# Patient Record
Sex: Female | Born: 1950
Health system: Southern US, Community
[De-identification: ages and names within clinical notes are randomized; demographics above are authoritative.]

## PROBLEM LIST (undated history)

## (undated) DIAGNOSIS — T7840XA Allergy, unspecified, initial encounter: Secondary | ICD-10-CM

## (undated) DIAGNOSIS — N6019 Diffuse cystic mastopathy of unspecified breast: Secondary | ICD-10-CM

## (undated) DIAGNOSIS — E785 Hyperlipidemia, unspecified: Secondary | ICD-10-CM

## (undated) DIAGNOSIS — M810 Age-related osteoporosis without current pathological fracture: Secondary | ICD-10-CM

## (undated) DIAGNOSIS — Z780 Asymptomatic menopausal state: Secondary | ICD-10-CM

## (undated) DIAGNOSIS — M1990 Unspecified osteoarthritis, unspecified site: Secondary | ICD-10-CM

## (undated) DIAGNOSIS — G5139 Clonic hemifacial spasm, unspecified: Secondary | ICD-10-CM

## (undated) HISTORY — DX: Clonic hemifacial spasm, unspecified: G51.39

## (undated) HISTORY — DX: Allergy, unspecified, initial encounter: T78.40XA

## (undated) HISTORY — DX: Unspecified osteoarthritis, unspecified site: M19.90

## (undated) HISTORY — DX: Asymptomatic menopausal state: Z78.0

## (undated) HISTORY — DX: Hyperlipidemia, unspecified: E78.5

## (undated) HISTORY — DX: Diffuse cystic mastopathy of unspecified breast: N60.19

## (undated) HISTORY — DX: Age-related osteoporosis without current pathological fracture: M81.0

## (undated) HISTORY — PX: BREAST LUMPECTOMY: SHX2

---

## 2001-01-10 ENCOUNTER — Ambulatory Visit (HOSPITAL_COMMUNITY): Admission: RE | Admit: 2001-01-10 | Discharge: 2001-01-10 | Payer: Self-pay | Admitting: General Surgery

## 2001-01-10 ENCOUNTER — Encounter (HOSPITAL_BASED_OUTPATIENT_CLINIC_OR_DEPARTMENT_OTHER): Payer: Self-pay | Admitting: General Surgery

## 2002-01-23 ENCOUNTER — Encounter (HOSPITAL_BASED_OUTPATIENT_CLINIC_OR_DEPARTMENT_OTHER): Payer: Self-pay | Admitting: General Surgery

## 2002-01-23 ENCOUNTER — Ambulatory Visit (HOSPITAL_COMMUNITY): Admission: RE | Admit: 2002-01-23 | Discharge: 2002-01-23 | Payer: Self-pay | Admitting: General Surgery

## 2002-12-18 ENCOUNTER — Other Ambulatory Visit: Admission: RE | Admit: 2002-12-18 | Discharge: 2002-12-18 | Payer: Self-pay | Admitting: Obstetrics and Gynecology

## 2003-02-12 ENCOUNTER — Ambulatory Visit (HOSPITAL_COMMUNITY): Admission: RE | Admit: 2003-02-12 | Discharge: 2003-02-12 | Payer: Self-pay | Admitting: General Surgery

## 2003-05-01 ENCOUNTER — Ambulatory Visit (HOSPITAL_COMMUNITY): Admission: RE | Admit: 2003-05-01 | Discharge: 2003-05-01 | Payer: Self-pay | Admitting: Gastroenterology

## 2004-04-07 ENCOUNTER — Ambulatory Visit (HOSPITAL_COMMUNITY): Admission: RE | Admit: 2004-04-07 | Discharge: 2004-04-07 | Payer: Self-pay | Admitting: General Surgery

## 2004-05-05 ENCOUNTER — Other Ambulatory Visit: Admission: RE | Admit: 2004-05-05 | Discharge: 2004-05-05 | Payer: Self-pay | Admitting: Obstetrics and Gynecology

## 2005-04-11 ENCOUNTER — Ambulatory Visit (HOSPITAL_COMMUNITY): Admission: RE | Admit: 2005-04-11 | Discharge: 2005-04-11 | Payer: Self-pay | Admitting: General Surgery

## 2005-09-28 ENCOUNTER — Other Ambulatory Visit: Admission: RE | Admit: 2005-09-28 | Discharge: 2005-09-28 | Payer: Self-pay | Admitting: Obstetrics and Gynecology

## 2006-05-10 ENCOUNTER — Ambulatory Visit (HOSPITAL_COMMUNITY): Admission: RE | Admit: 2006-05-10 | Discharge: 2006-05-10 | Payer: Self-pay | Admitting: Obstetrics and Gynecology

## 2007-05-17 ENCOUNTER — Ambulatory Visit (HOSPITAL_COMMUNITY): Admission: RE | Admit: 2007-05-17 | Discharge: 2007-05-17 | Payer: Self-pay | Admitting: Obstetrics and Gynecology

## 2008-07-02 ENCOUNTER — Ambulatory Visit (HOSPITAL_COMMUNITY): Admission: RE | Admit: 2008-07-02 | Discharge: 2008-07-02 | Payer: Self-pay | Admitting: Obstetrics and Gynecology

## 2009-07-09 ENCOUNTER — Ambulatory Visit (HOSPITAL_COMMUNITY): Admission: RE | Admit: 2009-07-09 | Discharge: 2009-07-09 | Payer: Self-pay | Admitting: Obstetrics and Gynecology

## 2009-08-10 ENCOUNTER — Encounter (HOSPITAL_COMMUNITY)
Admission: RE | Admit: 2009-08-10 | Discharge: 2009-09-09 | Payer: Self-pay | Admitting: Physical Medicine and Rehabilitation

## 2010-04-29 ENCOUNTER — Ambulatory Visit (HOSPITAL_COMMUNITY)
Admission: RE | Admit: 2010-04-29 | Discharge: 2010-04-29 | Payer: Self-pay | Source: Home / Self Care | Attending: Obstetrics & Gynecology | Admitting: Obstetrics & Gynecology

## 2010-07-28 ENCOUNTER — Other Ambulatory Visit (HOSPITAL_COMMUNITY): Payer: Self-pay | Admitting: Obstetrics and Gynecology

## 2010-07-28 DIAGNOSIS — Z139 Encounter for screening, unspecified: Secondary | ICD-10-CM

## 2010-08-02 ENCOUNTER — Ambulatory Visit (HOSPITAL_COMMUNITY)
Admission: RE | Admit: 2010-08-02 | Discharge: 2010-08-02 | Disposition: A | Discharge status: Home / Self Care | Payer: 59 | Source: Ambulatory Visit | Attending: Obstetrics and Gynecology | Admitting: Obstetrics and Gynecology

## 2010-08-02 DIAGNOSIS — Z1231 Encounter for screening mammogram for malignant neoplasm of breast: Secondary | ICD-10-CM | POA: Insufficient documentation

## 2010-08-02 DIAGNOSIS — Z139 Encounter for screening, unspecified: Secondary | ICD-10-CM

## 2010-08-26 NOTE — Op Note (Signed)
NAME:  Jaime Rodriguez, SPIKE A                           ACCOUNT NO.:  [id number]   MEDICAL RECORD NO.:  [id number]                   PATIENT TYPE:  AMB   LOCATION:  ENDO                                 FACILITY:  [facility]   PHYSICIAN:  Anselmo Rod, M.D.               DATE OF BIRTH:  1950-12-22   DATE OF PROCEDURE:  05/01/2003  DATE OF DISCHARGE:                                 OPERATIVE REPORT   PROCEDURE PERFORMED:  Screening colonoscopy.   ENDOSCOPIST:  Anselmo Rod, M.D.   INSTRUMENT USED:  Olympus videocolonoscope.   INDICATIONS FOR PROCEDURE:  60 year old African-American female underwent  screening colonoscopy.  Rule out colonic polyps, masses, etc.   PREPROCEDURE PREPARATION:  Informed consent was procured from the patient.  The patient was fasted for eight hours prior to the procedure and prepped  with a bottle of magnesium citrate and a gallon of GoLYTELY the night prior  to the procedure.  She also received Cipro 400 mg IV for MVP prophylaxis  prior to the procedure.   PREPROCEDURE PHYSICAL:  VITAL SIGNS:  The patient had stable vital signs.  NECK:  Supple.  CHEST:  Clear to auscultation.  S1 and S2 regular.  ABDOMEN:  Soft with normal bowel sounds.   DESCRIPTION OF THE PROCEDURE:  The patient was placed in the left lateral  decubitus position, sedated with 70 mg of Demerol and 7 mg of Versed  intravenously.  Once the patient was adequately sedated and maintained on  low-flow oxygen and continuous cardiac monitoring, the Olympus video  colonoscope was advanced from the rectum to the cecum. The appendicular  orifice and the ileocecal valve were clearly visualized and photographed.  No masses, polyps, erosions, ulcerations, or diverticula were seen.  Retroflexion in the rectum revealed no abnormalities.   IMPRESSION:  Normal colonoscopy up to the cecum.   RECOMMENDATIONS:  1. Repeat CRC screening is recommended in the next ten years unless the     patient develops any  abnormal     symptoms in the interim.  2. Continue on a high-fiber diet with liberal fluid intake.  3. Outpatient followup as the need arises in the future.                                               Anselmo Rod, M.D.    JNM/MEDQ  D:  05/01/2003  T:  05/01/2003  Job:  [id number]   cc:   Janine Limbo, M.D.  [address]  [place]  [place] [address]  Fax: [phone]

## 2011-01-18 ENCOUNTER — Ambulatory Visit
Admission: RE | Admit: 2011-01-18 | Discharge: 2011-01-18 | Disposition: A | Discharge status: Home / Self Care | Payer: 59 | Source: Ambulatory Visit | Attending: Gastroenterology | Admitting: Gastroenterology

## 2011-01-18 ENCOUNTER — Other Ambulatory Visit: Payer: Self-pay | Admitting: Gastroenterology

## 2011-01-18 ENCOUNTER — Ambulatory Visit (HOSPITAL_COMMUNITY)
Admission: RE | Admit: 2011-01-18 | Discharge: 2011-01-18 | Disposition: A | Discharge status: Home / Self Care | Payer: 59 | Source: Ambulatory Visit | Attending: Gastroenterology | Admitting: Gastroenterology

## 2011-01-18 DIAGNOSIS — Z1211 Encounter for screening for malignant neoplasm of colon: Secondary | ICD-10-CM

## 2011-01-18 DIAGNOSIS — K5900 Constipation, unspecified: Secondary | ICD-10-CM | POA: Insufficient documentation

## 2011-01-18 DIAGNOSIS — Z79899 Other long term (current) drug therapy: Secondary | ICD-10-CM | POA: Insufficient documentation

## 2011-01-18 DIAGNOSIS — E785 Hyperlipidemia, unspecified: Secondary | ICD-10-CM | POA: Insufficient documentation

## 2011-01-18 DIAGNOSIS — Q438 Other specified congenital malformations of intestine: Secondary | ICD-10-CM | POA: Insufficient documentation

## 2011-02-21 ENCOUNTER — Other Ambulatory Visit: Payer: Self-pay | Admitting: Neurology

## 2011-02-21 DIAGNOSIS — R519 Headache, unspecified: Secondary | ICD-10-CM

## 2011-03-01 ENCOUNTER — Other Ambulatory Visit: Payer: 59

## 2011-03-08 ENCOUNTER — Ambulatory Visit
Admission: RE | Admit: 2011-03-08 | Discharge: 2011-03-08 | Disposition: A | Discharge status: Home / Self Care | Payer: 59 | Source: Ambulatory Visit | Attending: Neurology | Admitting: Neurology

## 2011-03-08 DIAGNOSIS — R519 Headache, unspecified: Secondary | ICD-10-CM

## 2011-03-09 ENCOUNTER — Encounter (HOSPITAL_COMMUNITY): Payer: Self-pay | Admitting: Dietician

## 2011-03-09 NOTE — Progress Notes (Signed)
[facility] Diabetes Class Completion  Date:March 09, 2011  Time: 6:30 PM  Pt attended [facility]'s Diabetes Class on March 09, 2011.   Patient was educated on the following topics: carbohydrate metabolism in relation to diabetes, sources of carbohydrate, carbohydrate counting, meal planning strategies, food label reading, and portion control.   Melody Haver, RD, LDN Date:March 09, 2011 Time: 6:30 PM

## 2011-08-14 ENCOUNTER — Other Ambulatory Visit: Payer: Self-pay | Admitting: Obstetrics and Gynecology

## 2011-08-14 DIAGNOSIS — Z139 Encounter for screening, unspecified: Secondary | ICD-10-CM

## 2011-08-17 ENCOUNTER — Ambulatory Visit (HOSPITAL_COMMUNITY)
Admission: RE | Admit: 2011-08-17 | Discharge: 2011-08-17 | Disposition: A | Discharge status: Home / Self Care | Payer: 59 | Source: Ambulatory Visit | Attending: Obstetrics and Gynecology | Admitting: Obstetrics and Gynecology

## 2011-08-17 DIAGNOSIS — Z1231 Encounter for screening mammogram for malignant neoplasm of breast: Secondary | ICD-10-CM | POA: Insufficient documentation

## 2011-08-17 DIAGNOSIS — Z139 Encounter for screening, unspecified: Secondary | ICD-10-CM

## 2011-09-12 ENCOUNTER — Encounter: Payer: Self-pay | Admitting: Obstetrics and Gynecology

## 2011-09-14 ENCOUNTER — Telehealth: Payer: Self-pay | Admitting: Obstetrics and Gynecology

## 2011-09-14 NOTE — Telephone Encounter (Signed)
Triage/cht received/pt has no future appt/last seen in 2012

## 2011-09-14 NOTE — Telephone Encounter (Signed)
TC to pt.  Informed per EP that will contact her to review results in detail when has the opportunity.   Pt has received letter regarding referral as well.  Best number [phone]

## 2011-09-18 ENCOUNTER — Telehealth: Payer: Self-pay | Admitting: Obstetrics and Gynecology

## 2011-09-18 NOTE — Telephone Encounter (Signed)
Advised pt that mammogram showed dense breast tissue but no malignancy/cancer present on results. Pt voiced understanding. Annual appointment scheduled for 10/23/11.

## 2011-10-11 ENCOUNTER — Other Ambulatory Visit (HOSPITAL_COMMUNITY): Payer: Self-pay | Admitting: Internal Medicine

## 2011-10-11 DIAGNOSIS — M810 Age-related osteoporosis without current pathological fracture: Secondary | ICD-10-CM

## 2011-10-16 ENCOUNTER — Other Ambulatory Visit (HOSPITAL_COMMUNITY): Payer: 59

## 2011-10-20 ENCOUNTER — Other Ambulatory Visit (HOSPITAL_COMMUNITY): Payer: 59

## 2011-10-23 ENCOUNTER — Encounter: Payer: Self-pay | Admitting: Obstetrics and Gynecology

## 2011-10-23 ENCOUNTER — Ambulatory Visit (INDEPENDENT_AMBULATORY_CARE_PROVIDER_SITE_OTHER): Payer: 59 | Admitting: Obstetrics and Gynecology

## 2011-10-23 VITALS — BP 110/62 | Ht 60.0 in | Wt 100.0 lb

## 2011-10-23 DIAGNOSIS — N951 Menopausal and female climacteric states: Secondary | ICD-10-CM | POA: Insufficient documentation

## 2011-10-23 DIAGNOSIS — Z01419 Encounter for gynecological examination (general) (routine) without abnormal findings: Secondary | ICD-10-CM

## 2011-10-23 DIAGNOSIS — Z124 Encounter for screening for malignant neoplasm of cervix: Secondary | ICD-10-CM

## 2011-10-23 NOTE — Progress Notes (Signed)
The patient is taking hormone replacement therapy The patient  is taking a Calcium supplement. Post-menopausal bleeding:no  Last Pap: was normal July  2010 Last mammogram: was normal June  2013 Last DEXA scan : T= pt has one scheduled . Last colonoscopy:normal 2012  Urinary symptoms: none Normal bowel movements: Yes Reports abuse at home: No:   Subjective:    Jaime Rodriguez is a 61 y.o. female No obstetric history on file. who presents for annual exam. The patient has no complaints today. She has an elevated hemoglobin A1c.  She is currently taking hormone replacement therapy using the CombiPatch.  The following portions of the patient's history were reviewed and updated as appropriate: allergies, current medications, past family history, past medical history, past social history, past surgical history and problem list. See above.  Review of Systems Pertinent items are noted in HPI. Gastrointestinal:No change in bowel habits, no abdominal pain, no rectal bleeding Genitourinary:negative for dysuria, frequency, hematuria, nocturia and urinary incontinence    Objective:     BP 110/62  Ht 5' (1.524 m)  Wt 100 lb (45.36 kg)  BMI 19.53 kg/m2  Weight:  Wt Readings from Last 1 Encounters:  10/23/11 100 lb (45.36 kg)     BMI: Body mass index is 19.53 kg/(m^2). General Appearance: Alert, appropriate appearance for age. No acute distress HEENT: Grossly normal Neck / Thyroid: Supple, no masses, nodes or enlargement Lungs: clear to auscultation bilaterally Back: No CVA tenderness Breast Exam: No masses or nodes.No dimpling, nipple retraction or discharge. Cardiovascular: Regular rate and rhythm. S1, S2, no murmur Gastrointestinal: Soft, non-tender, no masses or organomegaly  ++++++++++++++++++++++++++++++++++++++++++++++++++++++++  Pelvic Exam: External genitalia: normal general appearance Vaginal: normal without tenderness, induration or masses Cervix: normal appearance Adnexa:  normal bimanual exam Uterus: normal size shape and consistency Rectovaginal: normal rectal, no masses  ++++++++++++++++++++++++++++++++++++++++++++++++++++++++  Lymphatic Exam: Non-palpable nodes in neck, clavicular, axillary, or inguinal regions  Psychiatric: Alert and oriented, appropriate affect.    Urinalysis:Not done      Assessment:    Normal gyn exam   Overweight or obese: No  Pelvic relaxation: Yes  Menopausal symptoms: yes. Severe: No.   Plan:    Pap smear.   Follow-up:  for annual exam  STD screen request: none,  RPR: No,  HBsAg: No.  Hepatitis C: No.  The updated Pap smear screening guidelines were discussed with the patient. The patient requested that I obtain a Pap smear: Yes.  Kegel exercises discussed: Yes.  Proper diet and regular exercise were reviewed.  Annual mammograms recommended starting at age 68. Proper breast care was discussed.  Screening colonoscopy is recommended beginning at age 63.  Regular health maintenance was reviewed.  Sleep hygiene was discussed.  Adequate calcium and vitamin D intake was emphasized.  The women's health initiative was reviewed.  I recommend that the patient try to discontinue the CombiPatch for now.  If her symptoms return, then we will talk about alternative treatment options.  Jaime Rodriguez.D.

## 2011-10-24 LAB — PAP IG W/ RFLX HPV ASCU

## 2011-10-27 ENCOUNTER — Other Ambulatory Visit (HOSPITAL_COMMUNITY): Payer: 59

## 2011-11-03 ENCOUNTER — Ambulatory Visit (HOSPITAL_COMMUNITY)
Admission: RE | Admit: 2011-11-03 | Discharge: 2011-11-03 | Disposition: A | Discharge status: Home / Self Care | Payer: 59 | Source: Ambulatory Visit | Attending: Internal Medicine | Admitting: Internal Medicine

## 2011-11-03 DIAGNOSIS — M810 Age-related osteoporosis without current pathological fracture: Secondary | ICD-10-CM

## 2011-11-03 DIAGNOSIS — M899 Disorder of bone, unspecified: Secondary | ICD-10-CM | POA: Insufficient documentation

## 2011-11-03 DIAGNOSIS — M949 Disorder of cartilage, unspecified: Secondary | ICD-10-CM | POA: Insufficient documentation

## 2012-03-12 ENCOUNTER — Other Ambulatory Visit: Payer: Self-pay | Admitting: Adult Health

## 2012-03-12 DIAGNOSIS — R599 Enlarged lymph nodes, unspecified: Secondary | ICD-10-CM

## 2012-03-15 ENCOUNTER — Ambulatory Visit (HOSPITAL_COMMUNITY)
Admission: RE | Admit: 2012-03-15 | Discharge: 2012-03-15 | Disposition: A | Discharge status: Home / Self Care | Payer: 59 | Source: Ambulatory Visit | Attending: Adult Health | Admitting: Adult Health

## 2012-03-15 DIAGNOSIS — R599 Enlarged lymph nodes, unspecified: Secondary | ICD-10-CM | POA: Insufficient documentation

## 2012-05-25 ENCOUNTER — Other Ambulatory Visit: Payer: Self-pay

## 2012-08-21 ENCOUNTER — Other Ambulatory Visit: Payer: Self-pay | Admitting: Obstetrics and Gynecology

## 2012-08-21 DIAGNOSIS — Z139 Encounter for screening, unspecified: Secondary | ICD-10-CM

## 2012-08-23 ENCOUNTER — Ambulatory Visit (HOSPITAL_COMMUNITY)
Admission: RE | Admit: 2012-08-23 | Discharge: 2012-08-23 | Disposition: A | Discharge status: Home / Self Care | Payer: 59 | Source: Ambulatory Visit | Attending: Obstetrics and Gynecology | Admitting: Obstetrics and Gynecology

## 2012-08-23 DIAGNOSIS — Z139 Encounter for screening, unspecified: Secondary | ICD-10-CM

## 2012-08-23 DIAGNOSIS — Z1231 Encounter for screening mammogram for malignant neoplasm of breast: Secondary | ICD-10-CM | POA: Insufficient documentation

## 2012-09-10 IMAGING — CT CT VIRTUAL COLONOSCOPY SCREENING
3 of 7 series · 15 of 46 positions shown, 17 images · non-contrast
Comparison: None

CLINICAL DATA: Screening for colon carcinoma

CT VIRTUAL COLONOSCOPY FOR SCREENING
TECHNIQUE: The patient was given a standard low so bowel
preparation with Gastrografin and barium for fluid and stool
tagging respectively.  The quality of the bowel preparation is
excellent.  Automated CO2 insufflation of the colon was performed
prior to image acquisition and colonic distention is excellent.
Image post processing was used to generate a 3D endoluminal fly-
through projection of the colon and to electronically subtract
stool/fluid as appropriate.

[Series 5: prone (id) · axial · 0.67mm/px · z∈[-400,-70]mm · 9 of 331 slices shown, 11 images (1 of 2)]
[im 34/331  soft-tissue]
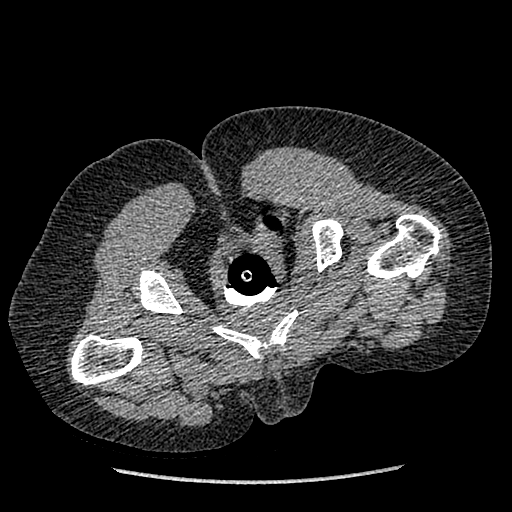
[im 34/331  bone]
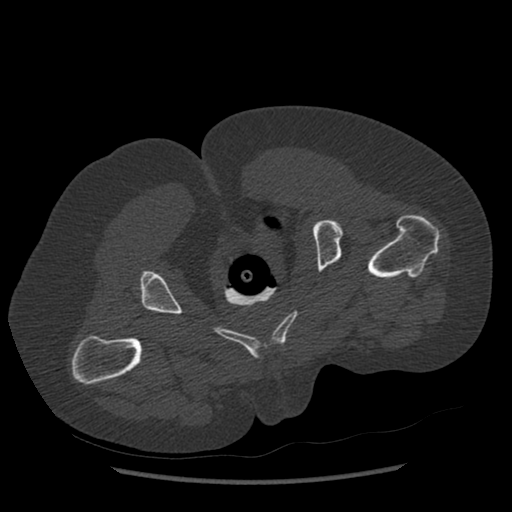
[im 67/331  soft-tissue]
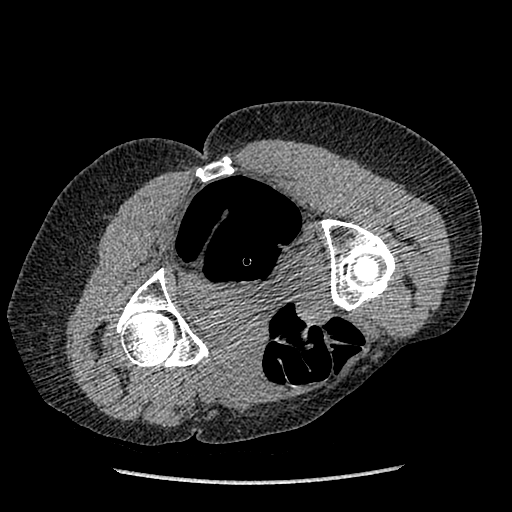
[im 100/331  soft-tissue]
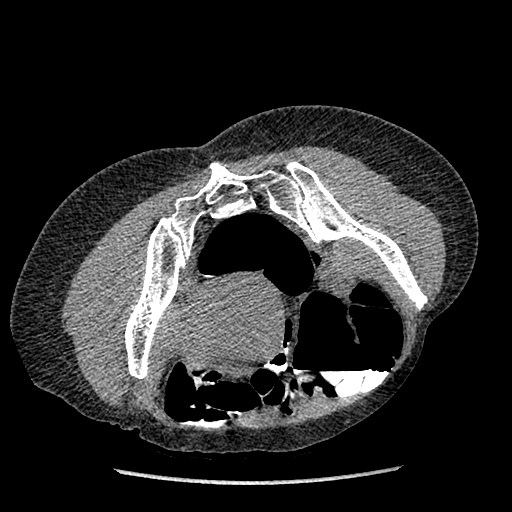
[im 133/331  soft-tissue]
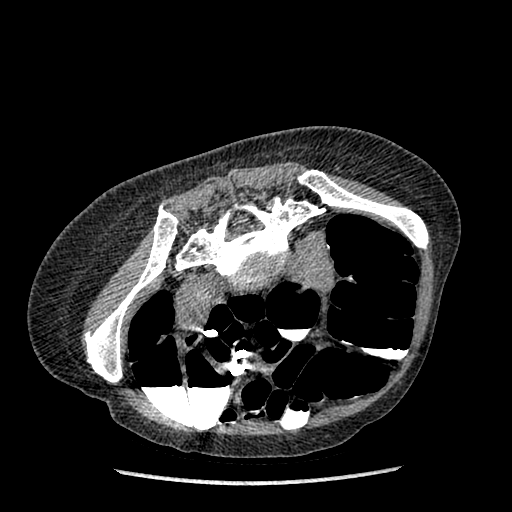
[im 166/331  soft-tissue]
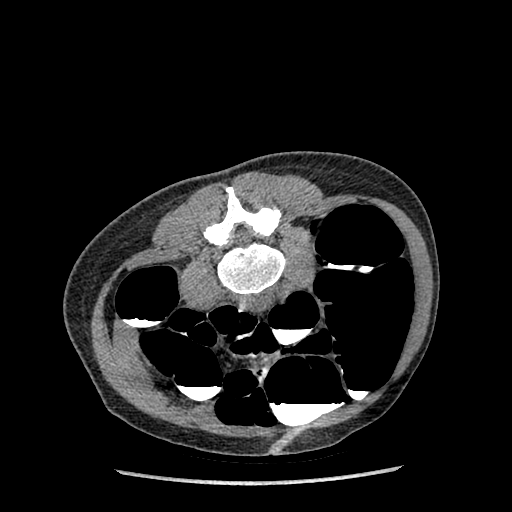
[im 199/331  soft-tissue]
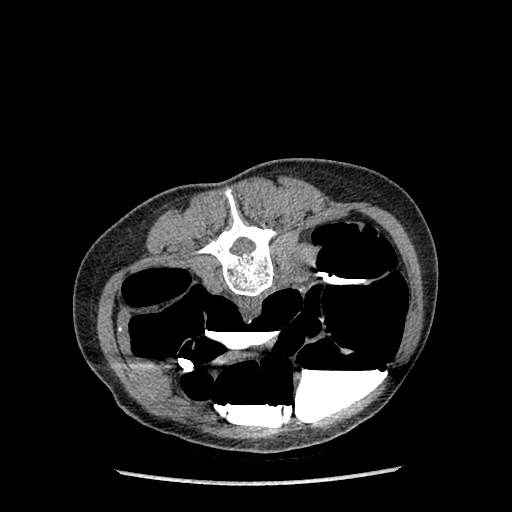
[im 232/331  soft-tissue]
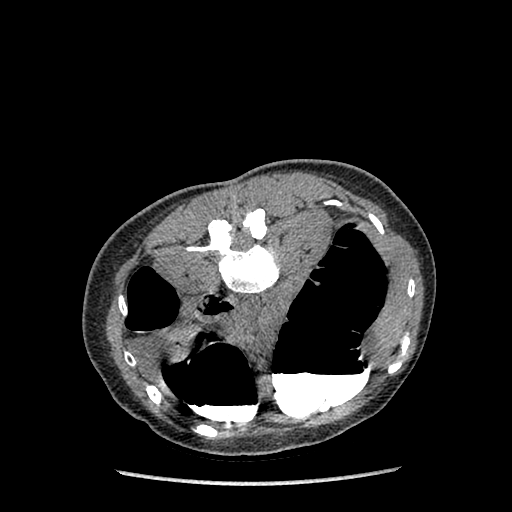
[im 265/331  soft-tissue]
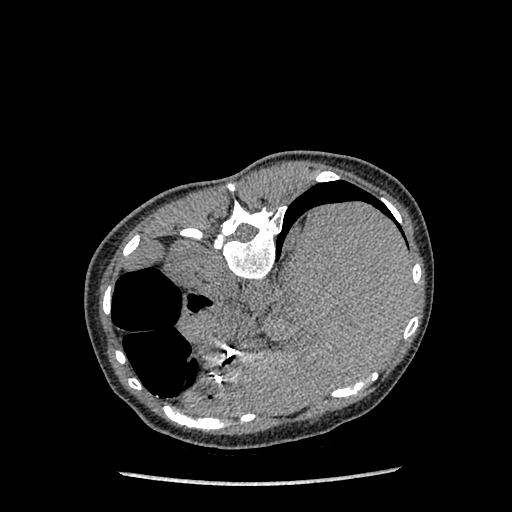
[im 298/331  soft-tissue]
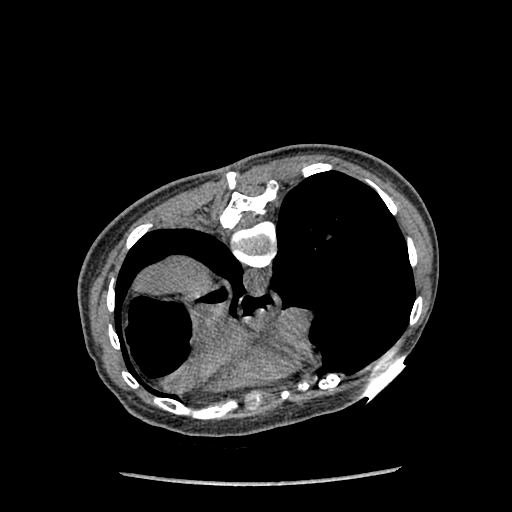
[im 298/331  bone]
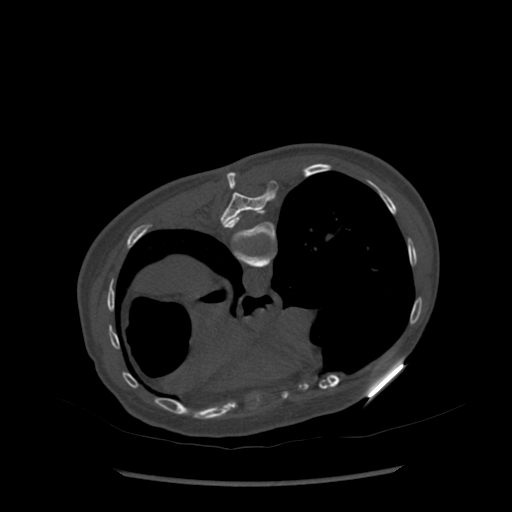

[Series 6: prone (id) · axial · 0.67mm/px · z∈[-338,-133]mm · 3 of 166 slices shown (2 of 2)]
[im 42/166  soft-tissue]
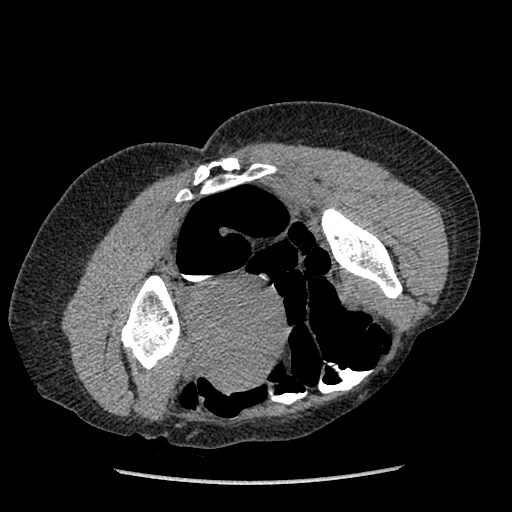
[im 83/166  soft-tissue]
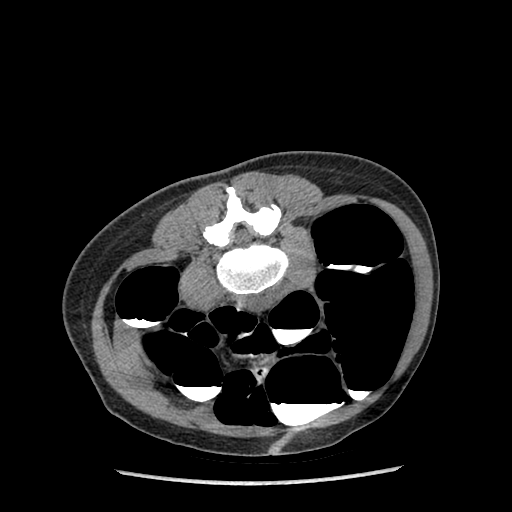
[im 124/166  soft-tissue]
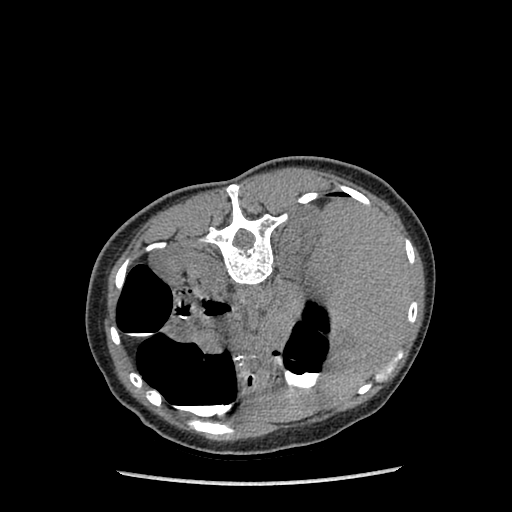

[Series 200: cor · coronal · 0.81mm/px · 3 of 125 slices shown]
[im 42/125  soft-tissue]
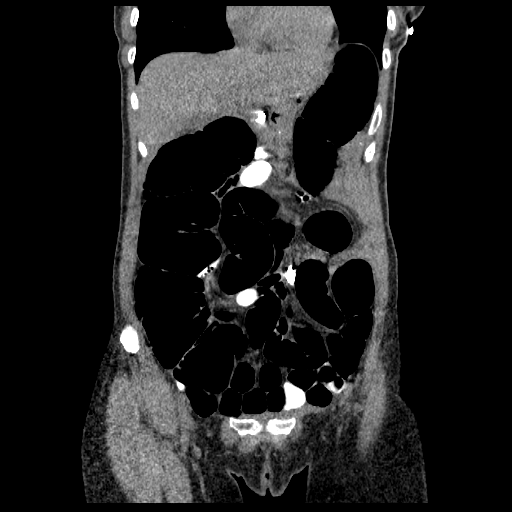
[im 56/125  soft-tissue]
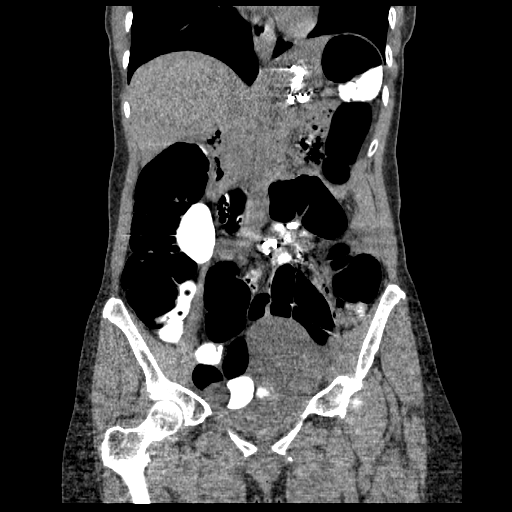
[im 69/125  soft-tissue]
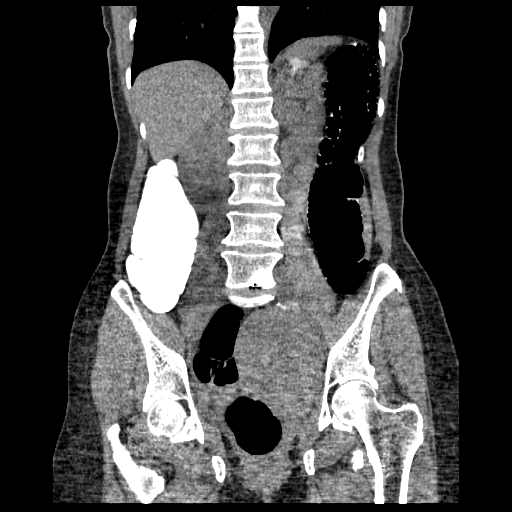

[15 of 46 positions shown; findings below may reference images not displayed]

FINDINGS: The study was performed after only oral contrast was
given prior to the virtual colonoscopy, following incomplete
optical colonoscopy this morning.  Therefore there is some retained
fluid and a small amount of feces with the fluid well tagged.  The
feces that is not tagged.  The presence of some untagged fecal
material does make the exclusion of small polypoid lesions
difficult, but no clinically significant polypoid lesion is seen.
No constricting lesion is noted.  The right colon is very well
visualized.
IMPRESSION: Some retained fluid and a small amount of feces but no clinically
significant polyp or mass is noted.

Virtual colonoscopy is not designed to detect diminutive polyps
(i.e., less than or equal to 5 mm), the presence or absence of
which may not affect clinical management.

CT ABDOMEN AND PELVIS WITHOUT CONTRAST
FINDINGS: The lung bases are clear.  There does appear to be a
small hiatal hernia present.  The liver is unremarkable in the
unenhanced state.  No calcified gallstones are seen.  The pancreas
is difficult to assess in the unenhanced state but grossly is
normal in size.  The adrenal glands also are difficult to
visualize, and the spleen is normal in size.  The stomach is
decompressed.  The kidneys are unremarkable in the unenhanced state
with no definite calculi or mass.  The abdominal aorta is normal in
caliber.

There is a rounded homogeneous mass in the left pelvis measuring
8.2 x 7.3 cm.  This is most consistent with an enlarged and
somewhat lobular uterus possibly with uterine fibroids.  No pelvic
adenopathy is seen and no fluid is noted within the pelvis.  This
probable enlarged uterus does extrinsically indent the rectosigmoid
colon from the left of midline.  The urinary bladder is
decompressed.  There is degenerative disc disease at the L5-S1
level.
IMPRESSION: 1.  Lobular soft tissue mass within the pelvis to the left of
midline most consistent with an enlarged uterus possibly due to
fibroids.  This does extrinsically indenting the rectosigmoid colon
from the left of midline.
2. Suspect small hiatal hernia.

## 2012-12-17 ENCOUNTER — Encounter: Payer: Self-pay | Admitting: Neurology

## 2012-12-18 ENCOUNTER — Ambulatory Visit (INDEPENDENT_AMBULATORY_CARE_PROVIDER_SITE_OTHER): Payer: 59 | Admitting: Neurology

## 2012-12-18 ENCOUNTER — Encounter: Payer: Self-pay | Admitting: Neurology

## 2012-12-18 VITALS — BP 130/80 | HR 90 | Ht 60.0 in | Wt 96.0 lb

## 2012-12-18 DIAGNOSIS — G5139 Clonic hemifacial spasm, unspecified: Secondary | ICD-10-CM | POA: Insufficient documentation

## 2012-12-18 DIAGNOSIS — G518 Other disorders of facial nerve: Secondary | ICD-10-CM

## 2012-12-18 HISTORY — DX: Clonic hemifacial spasm, unspecified: G51.39

## 2012-12-18 NOTE — Progress Notes (Signed)
Reason for visit: Hemifacial spasm  Jaime Rodriguez is a 62 y.o. female  History of present illness:  Jaime Rodriguez is a 62 year old right-handed black female with a history of left-sided hemifacial spasm that began approximately a year ago. The patient was seen by Dr. Vela Prose, and she underwent MRI evaluation of the brain with and without gadolinium that was unremarkable. The patient underwent Botox injections mainly because she did not wish to have medical therapy for the hemifacial spasm. The patient did well with the Botox, and the effects lasted several months. The patient did have some mild left-sided facial weakness following the injection. The patient never went back for further injections. The patient indicates that the problem has returned, and she wishes treatment for this problem at this time. The patient denies any numbness or weakness of the face, arms, or legs. The patient denies any balance issues, speech problems or swallowing problems. The patient denies any change in vision. The patient does not give a history of a Bell's palsy in the past. The patient is sent to this office for further evaluation. The patient indicates that the spasms may close down the eye on the left partially.  Past Medical History  Diagnosis Date  . Allergy   . Diabetes mellitus   . Fibrocystic disease of breast     LEFT BREAST SURGERY  . Hyperlipidemia   . Postmenopausal   . Hemifacial spasm 12/18/2012    Left lower face    Past Surgical History  Procedure Laterality Date  . Breast lumpectomy      Benign lesion    Family History  Problem Relation Age of Onset  . Cancer Mother   . Stroke Father   . Prostate cancer Brother   . Cancer Brother     Social history:  reports that she has never smoked. She has never used smokeless tobacco. She reports that she does not drink alcohol or use illicit drugs.  Medications:  Current Outpatient Prescriptions on File Prior to Visit  Medication Sig Dispense  Refill  . aspirin 81 MG chewable tablet Chew 81 mg by mouth daily.      . clindamycin (CLEOCIN) 300 MG capsule Take 300 mg by mouth daily as needed. Prior to dental work      . fexofenadine (ALLEGRA) 180 MG tablet Take 180 mg by mouth daily.       No current facility-administered medications on file prior to visit.      Allergies  Allergen Reactions  . Penicillins     ROS:  Out of a complete 14 system review of symptoms, the patient complains only of the following symptoms, and all other reviewed systems are negative.  Heart murmur Birthmarks Feeling hot, cold Allergies  Blood pressure 130/80, pulse 90, height 5' (1.524 m), weight 96 lb (43.545 kg).  Physical Exam  General: The patient is alert and cooperative at the time of the examination.  Head: Pupils are equal, round, and reactive to light. Discs are flat bilaterally.  Neck: The neck is supple, no carotid bruits are noted.  Respiratory: The respiratory examination is clear.  Cardiovascular: The cardiovascular examination reveals a regular rate and rhythm, no obvious murmurs or rubs are noted.  Skin: Extremities are without significant edema.  Neurologic Exam  Mental status:  Cranial nerves: Facial symmetry is present. There is good sensation of the face to pinprick and soft touch bilaterally. The strength of the facial muscles and the muscles to head turning and shoulder shrug  are normal bilaterally. Speech is well enunciated, no aphasia or dysarthria is noted. Extraocular movements are full. Visual fields are full. The patient has intermittent twitches around the left eye, and lower face. No spasms are seen on the forehead.  Motor: The motor testing reveals 5 over 5 strength of all 4 extremities. Good symmetric motor tone is noted throughout.  Sensory: Sensory testing is intact to pinprick, soft touch, vibration sensation, and position sense on all 4 extremities. No evidence of extinction is  noted.  Coordination: Cerebellar testing reveals good finger-nose-finger and heel-to-shin bilaterally.  Gait and station: Gait is normal. Tandem gait is normal. Romberg is negative. No drift is seen.  Reflexes: Deep tendon reflexes are symmetric and normal bilaterally. Toes are downgoing bilaterally.   Assessment/Plan:  1. Left hemifacial spasm  The hemifacial spasm primarily involves the lower face on the left. The patient did well with Botox injections in the past, and she will be sent back for this. The patient does not wish to have medical therapy such as carbamazepine, baclofen, or clonazepam for the hemifacial spasm. The patient will followup for the Botox injections once the treatment is approved through the insurance.  Marlan Palau MD 12/18/2012 7:36 PM  [facility] [address] [place], [place] [address]  Phone [phone] Fax [phone]

## 2013-01-22 ENCOUNTER — Telehealth: Payer: Self-pay | Admitting: Neurology

## 2013-01-23 MED ORDER — CARBAMAZEPINE 100 MG PO CHEW: CHEWABLE_TABLET | ORAL | Status: DC

## 2013-01-23 NOTE — Telephone Encounter (Signed)
This encounter was created in error - please disregard.

## 2013-01-23 NOTE — Telephone Encounter (Signed)
I called patient. Apparently Botox cannot be used secondary to financial constraints. The patient will be given a trial on low-dose carbamazepine. The patient will contact me if she is having side effects on this medication.  I talked with Dr. Hosie Poisson who indicated that if finances are the true reason for the lack of interest in Botox, the medication could the obtained at no cost possibly. The patient is interested in this, she is to contact our office. Otherwise, I will call in the carbamazepine.

## 2013-01-23 NOTE — Telephone Encounter (Signed)
Patient wanted to notify Dr.Willis and Dr.Yan that should rather not do Botox at this time for financial reasons. She is requesting to try an alternative treatment or medication.

## 2013-02-05 ENCOUNTER — Ambulatory Visit: Payer: 59 | Admitting: Neurology

## 2013-02-10 ENCOUNTER — Telehealth: Payer: Self-pay | Admitting: Neurology

## 2013-02-10 NOTE — Telephone Encounter (Signed)
If the patient is unable to get the pediatric dose, she will need to take one quarter tablet twice daily for 2 weeks, then one half tablet twice daily if she is having issues with with this, she will contact our office.

## 2013-02-13 ENCOUNTER — Other Ambulatory Visit: Payer: Self-pay

## 2013-05-02 ENCOUNTER — Telehealth: Payer: Self-pay | Admitting: Neurology

## 2013-05-02 MED ORDER — INCOBOTULINUMTOXINA 50 UNITS IM SOLR: 50.0000 [IU] | Freq: Once | INTRAMUSCULAR | Status: DC

## 2013-05-02 NOTE — Telephone Encounter (Signed)
Message copied by Levert FeinsteinYAN, Bellamia Ferch on Fri May 02, 2013  5:35 PM ------      Message from: Eugenie BirksOOPER, JANISHA R      Created: Fri May 02, 2013 10:24 AM       This patient has decided to try the injections. [facility] Xeomin and let me know when it has been done. ------

## 2013-05-28 ENCOUNTER — Ambulatory Visit: Payer: 59 | Admitting: Neurology

## 2013-06-04 ENCOUNTER — Ambulatory Visit: Payer: 59 | Admitting: Neurology

## 2013-06-06 ENCOUNTER — Ambulatory Visit (INDEPENDENT_AMBULATORY_CARE_PROVIDER_SITE_OTHER): Payer: 59 | Admitting: Neurology

## 2013-06-06 ENCOUNTER — Encounter: Payer: Self-pay | Admitting: Neurology

## 2013-06-06 ENCOUNTER — Ambulatory Visit: Payer: 59 | Admitting: Neurology

## 2013-06-06 VITALS — BP 131/79 | HR 80 | Ht 60.0 in | Wt 101.0 lb

## 2013-06-06 DIAGNOSIS — R292 Abnormal reflex: Secondary | ICD-10-CM | POA: Insufficient documentation

## 2013-06-06 DIAGNOSIS — R3915 Urgency of urination: Secondary | ICD-10-CM

## 2013-06-06 DIAGNOSIS — G518 Other disorders of facial nerve: Secondary | ICD-10-CM

## 2013-06-06 NOTE — Progress Notes (Signed)
Reason for visit: Hemifacial spasm  Jaime Rodriguez is a 63 y.o.  right-handed black female referred by Dr. Anne HahnWillis for EMG guided botulinumtoxin injection for left facial spasm  She had a history of left-sided hemifacial spasm that began approximately a year ago in 2013. The patient was seen by Dr. Vela ProseLewitt, and she underwent MRI evaluation of the brain with and without gadolinium that was unremarkable. The patient underwent Botox injections in 2013,   She did well with the Botox, and the effects lasted almost one year. She did has mild left facial droop after the injection.  Over the past few months, she began to experience recurrent left facial spasm again, most bothersome symptoms is around her eyes, she works as a Agricultural engineernursing assistant, is causing social embarrassment, sometimes even block her left vision.   She denies any balance issues, speech problems or swallowing problems. The patient denies any change in vision. She denies a previous history of left Bell's palsy.  In recent 1 year, since 2014, She began to have urinary urgency, she never had children in the past she denies bilateral upper and lower extremity paresthesia    Past Medical History  Diagnosis Date  . Allergy   . Diabetes mellitus   . Fibrocystic disease of breast     LEFT BREAST SURGERY  . Hyperlipidemia   . Postmenopausal   . Hemifacial spasm 12/18/2012    Left lower face    Past Surgical History  Procedure Laterality Date  . Breast lumpectomy      Benign lesion    Family History  Problem Relation Age of Onset  . Cancer Mother   . Stroke Father   . Prostate cancer Brother   . Cancer Brother     Social history:  reports that she has never smoked. She has never used smokeless tobacco. She reports that she does not drink alcohol or use illicit drugs.  Medications:  Current Outpatient Prescriptions on File Prior to Visit  Medication Sig Dispense Refill  . aspirin 81 MG chewable tablet Chew 81 mg by mouth  daily.      . carbamazepine (TEGRETOL) 100 MG chewable tablet One half tablet twice daily for 2 weeks, then take one full tablet twice daily  60 tablet  2  . clindamycin (CLEOCIN) 300 MG capsule Take 300 mg by mouth daily as needed. Prior to dental work      . ergocalciferol (VITAMIN D2) 50000 UNITS capsule Take 50,000 Units by mouth 2 (two) times a week.      . fexofenadine (ALLEGRA) 180 MG tablet Take 180 mg by mouth daily.      Marland Kitchen. incobotulinumtoxinA (XEOMIN) 50 UNITS SOLR injection Inject 50 Units into the muscle once.  50 each  3  . Pitavastatin Calcium (LIVALO) 4 MG TABS Take 4 mg by mouth daily. Mon-Fri       No current facility-administered medications on file prior to visit.      Allergies  Allergen Reactions  . Penicillins     ROS:  Out of a complete 14 system review of symptoms, the patient complains only of the following symptoms, and all other reviewed systems are negative. Urinary urgency  Blood pressure 131/79, pulse 80, height 5' (1.524 m), weight 101 lb (45.813 kg).  PHYSICAL EXAMINATOINS:  Generalized: In no acute distress  Neck: Supple, no carotid bruits   Cardiac: Regular rate rhythm  Pulmonary: Clear to auscultation bilaterally  Musculoskeletal: No deformity  Neurological examination  Mentation: Alert oriented  to time, place, history taking, and causual conversation  Cranial nerve II-XII: Pupils were equal round reactive to light extraocular movements were full, visual field were full on confrontational test.  Bilateral fundi were sharp  Facial sensation and strength were normal. hearing was intact to finger rubbing bilaterally. Uvula tongue midline.  head turning and shoulder shrug and were normal and symmetric.Tongue protrusion into cheek strength was normal.  Motor: normal tone, bulk and strength.  Sensory: Intact to fine touch, pinprick, preserved vibratory sensation, and proprioception at toes.  Coordination: Normal finger to nose, heel-to-shin  bilaterally there was no truncal ataxia  Gait: Rising up from seated position without assistance, normal stance, without trunk ataxia, moderate stride, good arm swing, smooth turning, able to perform tiptoe, and heel walking without difficulty.   Romberg signs: Negative  Deep tendon reflexes: Brachioradialis 3/3, biceps 3/3, triceps 3/3,, patellar 3/3,, Achilles 2/2, plantar responses were flexor bilaterally, she has bilateral Hoffman sign.   Assessment/Plan:   Left hemifacial spasm, under EMG guidance, incobotulinum toxin was injected at the left facial muscles, (50 units/1 cc normal saline, lot #[id number] 7  8) 37. 5 units were used, discarded 12.5 units.  Left orbicularis oculi at 2,3,4,6,8 oclock 2.5 units x5=12.5 units Right orbicularis oculi at 9:00 2 point 5 units  Left frontalis 7 point 5 units into injection sites Left zygomatic major2.5 Left risoruis 2.5  Left platysmus 10 units in 2 injections sites,   She also has hyperreflexia, bilateral upper extremity Hoffmann sign, a year history of urinary urgency, after discussed with patient, we will proceed with MRI of cervical, to rule out cervical spondylitic myelopathy  Levert Feinstein, M.D. Ph.D   06/06/2013 3:07 PM  [facility] [address] [place], [place] [address]  Phone [phone] Fax [phone]

## 2013-08-21 ENCOUNTER — Other Ambulatory Visit (HOSPITAL_COMMUNITY): Payer: Self-pay | Admitting: Internal Medicine

## 2013-08-21 DIAGNOSIS — Z1231 Encounter for screening mammogram for malignant neoplasm of breast: Secondary | ICD-10-CM

## 2013-09-03 ENCOUNTER — Ambulatory Visit: Payer: 59 | Admitting: Neurology

## 2013-09-05 ENCOUNTER — Ambulatory Visit (HOSPITAL_COMMUNITY)
Admission: RE | Admit: 2013-09-05 | Discharge: 2013-09-05 | Disposition: A | Discharge status: Home / Self Care | Payer: 59 | Source: Ambulatory Visit | Attending: Internal Medicine | Admitting: Internal Medicine

## 2013-09-05 DIAGNOSIS — Z1231 Encounter for screening mammogram for malignant neoplasm of breast: Secondary | ICD-10-CM | POA: Insufficient documentation

## 2014-04-17 ENCOUNTER — Telehealth: Payer: Self-pay | Admitting: Neurology

## 2014-04-21 NOTE — Telephone Encounter (Signed)
error 

## 2014-05-05 ENCOUNTER — Telehealth: Payer: Self-pay | Admitting: Neurology

## 2014-05-05 NOTE — Telephone Encounter (Signed)
This patient's pharmacy denied her for Xeomin due to it not being a Dx of Either blepharospasm or cervical dystonia/spasmodic torticollis. Please advise.  Jaime Rodriguez  Chart reviewed, last visit Feb 2015, if she wants to continue injection, let her come back to clinic, may change to BOTOX.

## 2014-05-06 ENCOUNTER — Ambulatory Visit: Payer: 59 | Admitting: Neurology

## 2014-05-06 NOTE — Telephone Encounter (Signed)
Do you want to set up an office visit first or have Jaime Rodriguez go ahead and try to get Botox approved?

## 2014-05-06 NOTE — Telephone Encounter (Signed)
Yes, give her an office visit.

## 2014-05-11 NOTE — Telephone Encounter (Signed)
Appt scheduled for 06/15/14 at 10:30.

## 2014-06-15 ENCOUNTER — Encounter: Payer: Self-pay | Admitting: Neurology

## 2014-06-15 ENCOUNTER — Ambulatory Visit (INDEPENDENT_AMBULATORY_CARE_PROVIDER_SITE_OTHER): Payer: 59 | Admitting: Neurology

## 2014-06-15 VITALS — BP 147/72 | HR 78 | Ht 60.0 in | Wt 105.0 lb

## 2014-06-15 DIAGNOSIS — G513 Clonic hemifacial spasm: Secondary | ICD-10-CM

## 2014-06-15 DIAGNOSIS — G5139 Clonic hemifacial spasm, unspecified: Secondary | ICD-10-CM

## 2014-06-15 NOTE — Progress Notes (Signed)
Reason for visit: Hemifacial spasm  Jaime Rodriguez is a 64 y.o.  right-handed black female referred by Dr. Anne HahnWillis for EMG guided botulinumtoxin injection for left facial spasm  She had a history of left-sided hemifacial spasm that began approximately a year ago in 2013. The patient was seen by Dr. Vela ProseLewitt, and she underwent MRI evaluation of the brain with and without gadolinium that was unremarkable. The patient underwent Botox injections in 2013,   She did well with the Botox, and the effects lasted almost one year. She did has mild left facial droop after the injection.  Over the past few months, she began to experience recurrent left facial spasm again, most bothersome symptoms is around her eyes, she works as a Agricultural engineernursing assistant, is causing social embarrassment, sometimes even block her left vision.   She denies any balance issues, speech problems or swallowing problems. The patient denies any change in vision. She denies a previous history of left Bell's palsy.  In recent 1 year, since 2014, She began to have urinary urgency, she never had children in the past she denies bilateral upper and lower extremity paresthesia   UPDATE March 7th 2016:  Last injection was in Feb 2015, very helpful, did very well, she noticed recurrence of her left facial twitching, almost constant, she hoped to continue EMG guided xeomin injection for her left facial spasm,   Past Medical History  Diagnosis Date  . Allergy   . Diabetes mellitus   . Fibrocystic disease of breast     LEFT BREAST SURGERY  . Hyperlipidemia   . Postmenopausal   . Hemifacial spasm 12/18/2012    Left lower face    Past Surgical History  Procedure Laterality Date  . Breast lumpectomy      Benign lesion    Family History  Problem Relation Age of Onset  . Cancer Mother   . Stroke Father   . Prostate cancer Brother   . Cancer Brother     Social history:  reports that she has never smoked. She has never used smokeless  tobacco. She reports that she does not drink alcohol or use illicit drugs.  Medications:  Current Outpatient Prescriptions on File Prior to Visit  Medication Sig Dispense Refill  . aspirin 81 MG chewable tablet Chew 81 mg by mouth daily.    . clindamycin (CLEOCIN) 300 MG capsule Take 300 mg by mouth daily as needed. Prior to dental work    . docusate sodium (DOCU SOFT) 100 MG capsule Take 100 mg by mouth 2 (two) times daily.    . meclizine (ANTIVERT) 25 MG tablet Take 25 mg by mouth 3 (three) times daily as needed for dizziness.     No current facility-administered medications on file prior to visit.      Allergies  Allergen Reactions  . Penicillins     ROS:  Out of a complete 14 system review of symptoms, the patient complains only of the following symptoms, and all other reviewed systems are negative. Urinary urgency  Blood pressure 147/72, pulse 78, height 5' (1.524 m), weight 105 lb (47.628 kg).  PHYSICAL EXAMNIATION:  Gen: NAD, conversant, well nourised, obese, well groomed                     Cardiovascular: Regular rate rhythm, no peripheral edema, warm, nontender. Eyes: Conjunctivae clear without exudates or hemorrhage Neck: Supple, no carotid bruise. Pulmonary: Clear to auscultation bilaterally   NEUROLOGICAL EXAM:  MENTAL STATUS: Speech:  Speech is normal; fluent and spontaneous with normal comprehension.  Cognition:    The patient is oriented to person, place, and time;     recent and remote memory intact;     language fluent;     normal attention, concentration,     fund of knowledge.  CRANIAL NERVES: CN II: Visual fields are full to confrontation. Fundoscopic exam is normal with sharp discs and no vascular changes. Venous pulsations are present bilaterally. Pupils are 4 mm and briskly reactive to light. Visual acuity is 20/20 bilaterally. CN III, IV, VI: extraocular movement are normal. No ptosis. CN V: Facial sensation is intact to pinprick in all 3  divisions bilaterally. Corneal responses are intact.  CN VII: She has frequent left hemifacial muscle spasm, mild left cheek puff weakness. Good bilateral frontalis, orbicularis oculi movement.  CN VIII: Hearing is normal to rubbing fingers CN IX, X: Palate elevates symmetrically. Phonation is normal. CN XI: Head turning and shoulder shrug are intact CN XII: Tongue is midline with normal movements and no atrophy.  MOTOR: There is no pronator drift of out-stretched arms. Muscle bulk and tone are normal. Muscle strength is normal.   Shoulder abduction Shoulder external rotation Elbow flexion Elbow extension Wrist flexion Wrist extension Finger abduction Hip flexion Knee flexion Knee extension Ankle dorsi flexion Ankle plantar flexion  R L REFLEXES: Reflexes are 2+ and symmetric at the biceps, triceps, knees, and ankles. Plantar responses are flexor.  SENSORY: Light touch, pinprick, position sense, and vibration sense are intact in fingers and toes.  COORDINATION: Rapid alternating movements and fine finger movements are intact. There is no dysmetria on finger-to-nose and heel-knee-shin. There are no abnormal or extraneous movements.   GAIT/STANCE: Posture is normal. Gait is steady with normal steps, base, arm swing, and turning. Heel and toe walking are normal. Tandem gait is normal.  Romberg is absent.   Assessment/Plan:  Left hemifacial spasm, responded very well to previous EMG guided xeomin injection, last injection was in February 2015, preauthorization process,  Return to clinic in 2-3 weeks for repeat injection  Levert Feinstein, M.D. Ph.D.  [facility] [address] [place], [place] [address] Phone: [phone] Fax:      [phone]

## 2014-07-13 ENCOUNTER — Telehealth: Payer: Self-pay | Admitting: Neurology

## 2014-07-13 ENCOUNTER — Ambulatory Visit: Payer: 59 | Admitting: Neurology

## 2014-07-13 NOTE — Telephone Encounter (Addendum)
Generate a letter for medical necessity of EMG guided Botox toxin injection for facial spasm  Letter is ready.  Levert FeinsteinYijun Deundra Furber, M.D. Ph.D.  [facility] [address] [place], [place] [address] Phone: [phone] Fax:      [phone]

## 2014-08-06 ENCOUNTER — Encounter: Payer: Self-pay | Admitting: Neurology

## 2014-08-06 ENCOUNTER — Ambulatory Visit (INDEPENDENT_AMBULATORY_CARE_PROVIDER_SITE_OTHER): Payer: 59 | Admitting: Neurology

## 2014-08-06 VITALS — BP 131/81 | HR 75 | Ht 60.0 in | Wt 105.0 lb

## 2014-08-06 DIAGNOSIS — G5139 Clonic hemifacial spasm, unspecified: Secondary | ICD-10-CM

## 2014-08-06 DIAGNOSIS — G513 Clonic hemifacial spasm: Secondary | ICD-10-CM

## 2014-08-06 NOTE — Progress Notes (Signed)
Botox - Lot #[id number] - Exp. 02/2017 //mck,rn. 

## 2014-08-06 NOTE — Progress Notes (Signed)
Reason for visit: Hemifacial spasm  Jaime Rodriguez is a 64 y.o.  right-handed black female referred by Dr. Anne HahnWillis for EMG guided botulinumtoxin injection for left facial spasm  She had a history of left-sided hemifacial spasm that began approximately a year ago in 2013. The patient was seen by Dr. Vela ProseLewitt, and she underwent MRI evaluation of the brain with and without gadolinium that was unremarkable. The patient underwent Botox injections in 2013,   She did well with the Botox, and the effects lasted almost one year. She did has mild left facial droop after the injection.  Over the past few months, she began to experience recurrent left facial spasm again, most bothersome symptoms is around her eyes, she works as a Agricultural engineernursing assistant, is causing social embarrassment, sometimes even block her left vision.   She denies any balance issues, speech problems or swallowing problems. The patient denies any change in vision. She denies a previous history of left Bell's palsy.  In recent 1 year, since 2014, She began to have urinary urgency, she never had children in the past she denies bilateral upper and lower extremity paresthesia   UPDATE March 7th 2016:  Last injection was in Feb 2015, very helpful, did very well, she noticed recurrence of her left facial twitching, almost constant, she hoped to continue EMG guided xeomin injection for her left facial spasm,   UPDATE April 28th 2016: Her insurance refused xeomin, approved for Botox A 100 unit   Past Medical History  Diagnosis Date  . Allergy   . Diabetes mellitus   . Fibrocystic disease of breast     LEFT BREAST SURGERY  . Hyperlipidemia   . Postmenopausal   . Hemifacial spasm 12/18/2012    Left lower face    Past Surgical History  Procedure Laterality Date  . Breast lumpectomy      Benign lesion    Family History  Problem Relation Age of Onset  . Cancer Mother   . Stroke Father   . Prostate cancer Brother   . Cancer Brother      Social history:  reports that she has never smoked. She has never used smokeless tobacco. She reports that she does not drink alcohol or use illicit drugs.  Medications:  Current Outpatient Prescriptions on File Prior to Visit  Medication Sig Dispense Refill  . aspirin 81 MG chewable tablet Chew 81 mg by mouth daily.    . Cholecalciferol (VITAMIN D3) 5000 UNITS CAPS Take by mouth 3 (three) times daily.    . clindamycin (CLEOCIN) 300 MG capsule Take 300 mg by mouth daily as needed. Prior to dental work    . docusate sodium (DOCU SOFT) 100 MG capsule Take 100 mg by mouth 2 (two) times daily.    Marland Kitchen. ezetimibe-simvastatin (VYTORIN) 10-20 MG per tablet Take 1 tablet by mouth daily.    Marland Kitchen. loratadine (CLARITIN) 10 MG tablet Take 10 mg by mouth daily.    . meclizine (ANTIVERT) 25 MG tablet Take 25 mg by mouth 3 (three) times daily as needed for dizziness.     No current facility-administered medications on file prior to visit.      Allergies  Allergen Reactions  . Penicillins     ROS:  Out of a complete 14 system review of symptoms, the patient complains only of the following symptoms, and all other reviewed systems are negative. Urinary urgency  Blood pressure 131/81, pulse 75, height 5' (1.524 m), weight 105 lb (47.628 kg).  PHYSICAL  EXAMNIATION:  Gen: NAD, conversant, well nourised, obese, well groomed                     Cardiovascular: Regular rate rhythm, no peripheral edema, warm, nontender. Eyes: Conjunctivae clear without exudates or hemorrhage Neck: Supple, no carotid bruise. Pulmonary: Clear to auscultation bilaterally   NEUROLOGICAL EXAM:  MENTAL STATUS: Speech:    Speech is normal; fluent and spontaneous with normal comprehension.  Cognition:    The patient is oriented to person, place, and time;     recent and remote memory intact;     language fluent;     normal attention, concentration,     fund of knowledge.  CRANIAL NERVES:  CN II: Visual fields are full  to confrontation. Fundoscopic exam is normal with sharp discs and no vascular changes. Venous pulsations are present bilaterally. Pupils are 4 mm and briskly reactive to light.  CN III, IV, VI: extraocular movement are normal. No ptosis. CN V: Facial sensation is intact to pinprick in all 3 divisions bilaterally. Corneal responses are intact.  CN VII: She has frequent left hemifacial muscle spasm, involving left orbicularis oculi, orbicularis oris, mild left cheek puff weakness. Good bilateral frontalis, orbicularis oculi movement.  CN VIII: Hearing is normal to rubbing fingers CN IX, X: Palate elevates symmetrically. Phonation is normal. CN XI: Head turning and shoulder shrug are intact CN XII: Tongue is midline with normal movements and no atrophy.  MOTOR: There is no pronator drift of out-stretched arms. Muscle bulk and tone are normal. Muscle strength is normal.   Shoulder abduction Shoulder external rotation Elbow flexion Elbow extension Wrist flexion Wrist extension Finger abduction Hip flexion Knee flexion Knee extension Ankle dorsi flexion Ankle plantar flexion  R L REFLEXES: Reflexes are 2+ and symmetric at the biceps, triceps, knees, and ankles. Plantar responses are flexor.  SENSORY: Light touch, pinprick, position sense, and vibration sense are intact in fingers and toes.  COORDINATION: Rapid alternating movements and fine finger movements are intact. There is no dysmetria on finger-to-nose and heel-knee-shin. There are no abnormal or extraneous movements.   GAIT/STANCE: Posture is normal. Gait is steady with normal steps, base, arm swing, and turning. Heel and toe walking are normal. Tandem gait is normal.  Romberg is absent.   Assessment/Plan:  Left hemifacial spasm, responded very well to previous EMG guided xeomin injection,   EMG guided Botox injection today, used 60 units, discarded 40 units,100 units of Botox  was dissolved into 2 cc of normal saline  Left corrugate 5 units Right corrugate 5 units Procerus 5 units Left orbicularis oculi at 2, 3, 4, 6, 8:00, 2.5 units each,x5= 12.5 units total  Left frontalis 2.5x2= 5 units Left nasalis 5 units, Left zygomatic major 2.5 units  Right frontalis 2.5x2=5 units  Right orbicularis ocular at 8, 9, 2.5 units eachx2= 5 units  Left platysmas 10 units  Patient tolerate the injection well, will return to clinic in 3 months for repeat injection   Levert Feinstein, M.D. Ph.D.  [facility] [address] [place], [place] [address] Phone: [phone] Fax:      [phone]

## 2014-09-14 ENCOUNTER — Telehealth: Payer: Self-pay

## 2014-09-14 NOTE — Telephone Encounter (Signed)
Called patient and left her message about Botox Patient needs to pay balance before she can schedule.

## 2014-09-14 NOTE — Telephone Encounter (Signed)
Patient called/returning [name]'s call. I relayed the information and I transferred her to the billing/accounting department.

## 2014-09-15 NOTE — Telephone Encounter (Signed)
Called patient back and asked her to call back and ask for billing Dept. After she speaks to billing we can get her on the schedule for her Botox injection.

## 2014-10-02 ENCOUNTER — Other Ambulatory Visit (HOSPITAL_COMMUNITY): Payer: Self-pay | Admitting: Internal Medicine

## 2014-10-02 DIAGNOSIS — Z1231 Encounter for screening mammogram for malignant neoplasm of breast: Secondary | ICD-10-CM

## 2014-10-09 ENCOUNTER — Ambulatory Visit (HOSPITAL_COMMUNITY)
Admission: RE | Admit: 2014-10-09 | Discharge: 2014-10-09 | Disposition: A | Discharge status: Home / Self Care | Payer: 59 | Source: Ambulatory Visit | Attending: Internal Medicine | Admitting: Internal Medicine

## 2014-10-09 DIAGNOSIS — Z1231 Encounter for screening mammogram for malignant neoplasm of breast: Secondary | ICD-10-CM | POA: Diagnosis present

## 2014-11-26 ENCOUNTER — Other Ambulatory Visit: Payer: Self-pay | Admitting: Obstetrics & Gynecology

## 2014-11-26 DIAGNOSIS — Z7721 Contact with and (suspected) exposure to potentially hazardous body fluids: Secondary | ICD-10-CM

## 2014-12-23 ENCOUNTER — Telehealth: Payer: Self-pay | Admitting: Neurology

## 2014-12-23 NOTE — Telephone Encounter (Signed)
Patient called stating it is time for Botox but she doesn't want to take it. The muscle spams have returned and it has been a yr since the last injection. The opthamologist has given her eye drops to use for dryness but it is not helping with the left eye, this is a side effect from Botox. Is there something else she can try by mouth. Please call and advise. Patient can be reached at [phone].

## 2014-12-28 NOTE — Telephone Encounter (Signed)
Please let patient know, last injection was April 2016, before that was in 2015, she only need low dose, intermittent injection, out of all the choices I still think EMG guided Botox injection would provide the best relief of her left hemifacial spasm with less side effect,  We may give her a  follow-up visit to discuss medication choice if she still wants to try medication treatment, options are, antiepileptic medications, such as Tegretol, Trileptal, potential side effect would be dizziness, lightheadedness, dry mouth,

## 2014-12-28 NOTE — Telephone Encounter (Signed)
Spoke to patient - provided her with the information below - she would like to come in to discuss medications.  Offered to make the appt for her but she needs to call back when she has her work schedule available.

## 2015-05-14 DIAGNOSIS — Z0000 Encounter for general adult medical examination without abnormal findings: Secondary | ICD-10-CM | POA: Diagnosis not present

## 2015-05-14 DIAGNOSIS — Z23 Encounter for immunization: Secondary | ICD-10-CM | POA: Diagnosis not present

## 2015-05-14 DIAGNOSIS — E559 Vitamin D deficiency, unspecified: Secondary | ICD-10-CM | POA: Diagnosis not present

## 2015-05-14 DIAGNOSIS — M2550 Pain in unspecified joint: Secondary | ICD-10-CM | POA: Diagnosis not present

## 2015-05-14 DIAGNOSIS — E782 Mixed hyperlipidemia: Secondary | ICD-10-CM | POA: Diagnosis not present

## 2015-05-14 MED FILL — LIVALO 4 MG TABLET: 4 | 30 days supply | Qty: 20 | Fill #0

## 2015-05-14 MED FILL — DICLOFENAC SODIUM 1% GEL: 1 | 13 days supply | Qty: 100 | Fill #0

## 2015-05-18 MED FILL — BOTOX 100 UNITS VIAL: 100 | 1 days supply | Qty: 1 | Fill #0

## 2015-05-20 ENCOUNTER — Telehealth: Payer: Self-pay | Admitting: Neurology

## 2015-05-20 NOTE — Telephone Encounter (Signed)
Called patient to inform her that medication was ready for pick up at [facility] outpatient pharmacy.

## 2015-05-21 NOTE — Telephone Encounter (Signed)
Called patient again and left VM informing her that the medication was ready to pick up at [facility].

## 2015-05-24 ENCOUNTER — Encounter: Payer: Self-pay | Admitting: Neurology

## 2015-05-24 ENCOUNTER — Ambulatory Visit (INDEPENDENT_AMBULATORY_CARE_PROVIDER_SITE_OTHER): Payer: 59 | Admitting: Neurology

## 2015-05-24 VITALS — BP 126/77 | HR 72 | Ht 60.0 in | Wt 106.0 lb

## 2015-05-24 DIAGNOSIS — G513 Clonic hemifacial spasm: Secondary | ICD-10-CM | POA: Diagnosis not present

## 2015-05-24 DIAGNOSIS — G5139 Clonic hemifacial spasm, unspecified: Secondary | ICD-10-CM

## 2015-05-24 NOTE — Progress Notes (Signed)
Reason for visit: Hemifacial spasm  Jaime Rodriguez is a 65 y.o.  right-handed black female referred by Dr. Anne Hahn for EMG guided botulinumtoxin injection for left facial spasm  She had a history of left-sided hemifacial spasm that began approximately a year ago in 2013. The patient was seen by Dr. Vela Prose, and she underwent MRI evaluation of the brain with and without gadolinium that was unremarkable. The patient underwent Botox injections in 2013,   She did well with the Botox, and the effects lasted almost one year. She did has mild left facial droop after the injection.  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   She denies any balance issues, speech problems or swallowing problems. The patient denies any change in vision. She denies a previous history of left Bell's palsy.  In recent 1 year, since 2014, She began to have urinary urgency, she never had children in the past she denies bilateral upper and lower extremity paresthesia   UPDATE March 7th 2016:  Last injection was in Feb 2015, very helpful, did very well, she noticed recurrence of her left facial twitching, almost constant, she hoped to continue EMG guided xeomin injection for her left facial spasm,   UPDATE April 28th 2016: Her insurance refused xeomin, approved for Botox A 100 unit  UPDATE May 24 2015:    Past Medical History  Diagnosis Date  . Allergy   . Diabetes mellitus   . Fibrocystic disease of breast     LEFT BREAST SURGERY  . Hyperlipidemia   . Postmenopausal   . Hemifacial spasm 12/18/2012    Left lower face    Past Surgical History  Procedure Laterality Date  . Breast lumpectomy      Benign lesion    Family History  Problem Relation Age of Onset  . Cancer Mother   . Stroke Father   . Prostate cancer  Brother   . Cancer Brother     Social history:  reports that she has never smoked. She has never used smokeless tobacco. She reports that she does not drink alcohol or use illicit drugs.  Medications:  Current Outpatient Prescriptions on File Prior to Visit  Medication Sig Dispense Refill  . botulinum toxin Type A (BOTOX) 100 UNITS SOLR injection Inject 100 Units into the muscle once.    . Cholecalciferol (VITAMIN D3) 5000 UNITS CAPS Take by mouth 3 (three) times daily.    . clindamycin (CLEOCIN) 300 MG capsule Take 300 mg by mouth daily as needed. Prior to dental work    . docusate sodium (DOCU SOFT) 100 MG capsule Take 100 mg by mouth 2 (two) times daily.    Marland Kitchen ezetimibe-simvastatin (VYTORIN) 10-20 MG per tablet Take 1 tablet by mouth daily.    Marland Kitchen loratadine (CLARITIN) 10 MG tablet Take 10 mg by mouth daily.    . meclizine (ANTIVERT) 25 MG tablet Take 25 mg by mouth 3 (three) times daily as needed for dizziness.    . valACYclovir (VALTREX) 500 MG tablet Take 500 mg by mouth 2 (two) times daily.     No current facility-administered medications on file prior to visit.      Allergies  Allergen Reactions  . Penicillins     ROS:  Out of a complete 14 system review of symptoms, the patient complains only of  the following symptoms, and all other reviewed systems are negative. Urinary urgency  Blood pressure 126/77, pulse 72, height 5' (1.524 m), weight 106 lb (48.081 kg).  PHYSICAL EXAMNIATION:  Gen: NAD, conversant, well nourised, obese, well groomed                     Cardiovascular: Regular rate rhythm, no peripheral edema, warm, nontender. Eyes: Conjunctivae clear without exudates or hemorrhage Neck: Supple, no carotid bruise. Pulmonary: Clear to auscultation bilaterally   NEUROLOGICAL EXAM:  MENTAL STATUS: Speech:    Speech is normal; fluent and spontaneous with normal comprehension.  Cognition:    The patient is oriented to person, place, and time;     recent and  remote memory intact;     language fluent;     normal attention, concentration,     fund of knowledge.  CRANIAL NERVES:  CN II: Visual fields are full to confrontation. Fundoscopic exam is normal with sharp discs and no vascular changes. Venous pulsations are present bilaterally. Pupils are 4 mm and briskly reactive to light.  CN III, IV, VI: extraocular movement are normal. No ptosis. CN V: Facial sensation is intact to pinprick in all 3 divisions bilaterally. Corneal responses are intact.  CN VII: She has frequent left hemifacial muscle spasm, involving left orbicularis oculi, orbicularis oris, mild left cheek puff weakness. Good bilateral frontalis, orbicularis oculi movement.  CN VIII: Hearing is normal to rubbing fingers CN IX, X: Palate elevates symmetrically. Phonation is normal. CN XI: Head turning and shoulder shrug are intact CN XII: Tongue is midline with normal movements and no atrophy.  MOTOR: There is no pronator drift of out-stretched arms. Muscle bulk and tone are normal. Muscle strength is normal.   Shoulder abduction Shoulder external rotation Elbow flexion Elbow extension Wrist flexion Wrist extension Finger abduction Hip flexion Knee flexion Knee extension Ankle dorsi flexion Ankle plantar flexion  R L REFLEXES: Reflexes are 2+ and symmetric at the biceps, triceps, knees, and ankles. Plantar responses are flexor.  SENSORY: Light touch, pinprick, position sense, and vibration sense are intact in fingers and toes.  COORDINATION: Rapid alternating movements and fine finger movements are intact. There is no dysmetria on finger-to-nose and heel-knee-shin. There are no abnormal or extraneous movements.   GAIT/STANCE: Posture is normal. Gait is steady with normal steps, base, arm swing, and turning. Heel and toe walking are normal. Tandem gait is normal.  Romberg is absent.   Assessment/Plan:  Left hemifacial  spasm, responded very well to previous EMG guided xeomin injection,   EMG guided Botox injection today, used 80 units, discarded 20 units,100 units of Botox was dissolved into 2 cc of normal saline  Left corrugate 5 units Right corrugate 5 units Procerus 5 units Left orbicularis oculi at 2, 3, 4, 6, 7, 8, 2.5 units each,x6= 15 units total  Left frontalis 2.5x2= 5 units Left nasalis 5 units, Left zygomatic major 2.5 units  Right frontalis 2.5x2=5 units  Right orbicularis ocular at 8, 9, 2.5 units eachx2= 5 units  Left platysmas 10 units Right platysmus 10 units  Left risorius 2.5 units Left orbicularis oris 2.5 units  Left temporalis 5 units   Patient tolerate the injection well, will return to clinic in 3 months for repeat injection   Levert Feinstein, M.D. Ph.D.  [facility] [address] [place], [place] [address] Phone: [phone] Fax:      [phone]

## 2015-05-24 NOTE — Telephone Encounter (Signed)
Called and left the patient a VM telling her that her medication needs to be picked up from the pharmacy.

## 2015-05-24 NOTE — Progress Notes (Signed)
**  Botox 100 units x 1 vial, Lot [id number], Exp 12/2017, NDC 0023-1145-01**mck,rn

## 2015-06-08 ENCOUNTER — Telehealth: Payer: Self-pay | Admitting: Neurology

## 2015-06-08 DIAGNOSIS — Z6820 Body mass index (BMI) 20.0-20.9, adult: Secondary | ICD-10-CM | POA: Diagnosis not present

## 2015-06-08 DIAGNOSIS — Z124 Encounter for screening for malignant neoplasm of cervix: Secondary | ICD-10-CM | POA: Diagnosis not present

## 2015-06-08 DIAGNOSIS — Z01411 Encounter for gynecological examination (general) (routine) with abnormal findings: Secondary | ICD-10-CM | POA: Diagnosis not present

## 2015-06-18 DIAGNOSIS — G513 Clonic hemifacial spasm: Secondary | ICD-10-CM | POA: Diagnosis not present

## 2015-06-21 MED FILL — LIVALO 4 MG TABLET: 4 | 30 days supply | Qty: 20 | Fill #1

## 2015-06-24 DIAGNOSIS — Z13820 Encounter for screening for osteoporosis: Secondary | ICD-10-CM | POA: Diagnosis not present

## 2015-06-25 DIAGNOSIS — R252 Cramp and spasm: Secondary | ICD-10-CM | POA: Diagnosis not present

## 2015-07-02 ENCOUNTER — Ambulatory Visit (HOSPITAL_COMMUNITY)
Admission: RE | Admit: 2015-07-02 | Discharge: 2015-07-02 | Disposition: A | Discharge status: Home / Self Care | Payer: 59 | Source: Ambulatory Visit | Attending: Internal Medicine | Admitting: Internal Medicine

## 2015-07-02 ENCOUNTER — Other Ambulatory Visit (HOSPITAL_COMMUNITY): Payer: Self-pay | Admitting: Nurse Practitioner

## 2015-07-02 DIAGNOSIS — R252 Cramp and spasm: Secondary | ICD-10-CM | POA: Insufficient documentation

## 2015-07-02 DIAGNOSIS — I700 Atherosclerosis of aorta: Secondary | ICD-10-CM | POA: Insufficient documentation

## 2015-07-02 DIAGNOSIS — M439 Deforming dorsopathy, unspecified: Secondary | ICD-10-CM | POA: Insufficient documentation

## 2015-07-02 DIAGNOSIS — Z01818 Encounter for other preprocedural examination: Secondary | ICD-10-CM | POA: Diagnosis not present

## 2015-07-05 ENCOUNTER — Ambulatory Visit: Payer: Self-pay | Admitting: *Deleted

## 2015-07-14 ENCOUNTER — Other Ambulatory Visit: Payer: Self-pay | Admitting: *Deleted

## 2015-07-14 ENCOUNTER — Encounter: Payer: Self-pay | Admitting: *Deleted

## 2015-07-14 VITALS — BP 110/60 | HR 79 | Ht 60.0 in | Wt 106.0 lb

## 2015-07-14 DIAGNOSIS — E785 Hyperlipidemia, unspecified: Secondary | ICD-10-CM

## 2015-07-15 ENCOUNTER — Encounter: Payer: Self-pay | Admitting: *Deleted

## 2015-07-15 NOTE — Patient Outreach (Signed)
[facility]) Care Management   07/14/15  Jaime Rodriguez 12/18/1950 [id number]  Jaime Rodriguez is an 65 y.o. female who presents to the [facility] Care Management office to enroll in the Link To Wellness program for self management assistance with hyperlipidemia. She says she was referred to the  Subjective:  Jaime Rodriguez states she self referred to the [employer] [facility] program after she read information about the program on [employer]. She states she has struggled with high cholesterol for many years despite having normal BMI and adherence with medication. She says she was also told that her Hgb A1C was 6.5% when she saw her primary care provider in February. She is requesting help with lifestyle modifications and meal planning to avoid medication for elevated Hgb A1C or more medication for her cholesterol.  Objective:   She brings the results of her lab work done 05/14/15 with her. Review of Systems  Constitutional: Negative.     Physical Exam  Constitutional: She is oriented to person, place, and time. She appears well-developed and well-nourished.  Cardiovascular: Normal rate and regular rhythm.   Respiratory: Effort normal.  Neurological: She is alert and oriented to person, place, and time.  Skin: Skin is warm and dry.  Psychiatric: She has a normal mood and affect. Her behavior is normal. Judgment and thought content normal.   Filed Weights   07/14/15 1015  Weight: 106 lb (48.081 kg)   Filed Vitals:   07/14/15 1015  BP: 110/60  Pulse: 79   Encounter Medications:   Outpatient Encounter Prescriptions as of 07/14/2015  Medication Sig Note  . aspirin 81 MG tablet Take 81 mg by mouth daily.   . carboxymethylcellulose (REFRESH PLUS) 0.5 % SOLN 1 drop 3 (three) times daily as needed.   . Cholecalciferol (VITAMIN D3) 5000 UNITS CAPS Take by mouth 3 (three) times daily.   . clindamycin (CLEOCIN) 300 MG capsule Take 300 mg by mouth daily as needed.  Prior to dental work   . diclofenac sodium (VOLTAREN) 1 % GEL  07/14/2015: Use prn  . docusate sodium (DOCU SOFT) 100 MG capsule Take 100 mg by mouth 2 (two) times daily. 07/14/2015: 200 mg daily  . LIVALO 4 MG TABS  05/24/2015: Received from: External Pharmacy  . loratadine (CLARITIN) 10 MG tablet Take 10 mg by mouth daily. 07/14/2015: Takes prn  . meclizine (ANTIVERT) 25 MG tablet Take 25 mg by mouth 3 (three) times daily as needed for dizziness.   . botulinum toxin Type A (BOTOX) 100 UNITS SOLR injection Inject 100 Units into the muscle once. 08/06/2014: G51.3.  Pt has medication.  Marland Kitchen. ezetimibe-simvastatin (VYTORIN) 10-20 MG per tablet Take 1 tablet by mouth daily. Reported on 07/14/2015   . valACYclovir (VALTREX) 500 MG tablet Take 500 mg by mouth 2 (two) times daily. Reported on 07/14/2015 07/14/2015: Uses prn   No facility-administered encounter medications on file as of 07/14/2015.    Functional Status:   In your present state of health, do you have any difficulty performing the following activities: 07/14/2015  Hearing? Y  Vision? N  Difficulty concentrating or making decisions? N  Walking or climbing stairs? N  Dressing or bathing? N  Doing errands, shopping? N    Fall/Depression Screening:    PHQ 2/9 Scores 07/14/2015  PHQ - 2 Score 0    Assessment:   [employer] employee enrolling in the Link To Wellness program for self management assistance with hyperlipidemia.   Plan:  [facility] CM Care Plan Problem One        Most Recent Value   Care Plan Problem One  Member with hyperlipidemia with elevated total cholesterol and LDL despite care plan adherence and most recent Hgb A1C= 6.5% enrolling in Link To Wellness program for self management assistance   Role Documenting the Problem One  Care Management Coordinator   Care Plan for Problem One  Active   [facility] Long Term Goal (31-90 days)  Member will meet with RD for dietary assistance with elevated cholesterol and LDL and elevated Hgb A1C with  improvement in lipid panel and Hgb A1C at next assessment   [facility] Long Term Goal Start Date  07/14/15   Interventions for Problem One Long Term Goal  Discussed Link to Wellness program goals, requirements and benefits, reviewed member's rights and responsibilities ,provided hyperlipidemia information packet with explanation of contents, ensured member agreed and signed consent to participate and authorization to release and receive health information, consent, participation agreement and consent to enroll in program, assessed member's current knowledge of  hyperlipidemia and reviewed lipid profile results done 05/14/15 and targets, provided handout on how to lower LDL and total cholesterol, reviewed medications and assessed medication adherence,  discussed nutritional counseling benefit provided by [facility] health plan, with patient in agreement -referral for nutritional counseling with a registered dietician faxed to the [facility], discussed benefits  of physical activity on lipid  levels and insulin sensitivity and assisting with weight management and cardiovascular health, encouraged patient to continue to exercise, discussed exercise opportunities offered for free by [facility], reviewed Hgb A1C result done 05/14/15 and explained ADA diagnostic tests for Type II DM and that a second test is required for confirmation, reviewed upcoming appointments with health care providers, assigned Emmi education modules related to hyperlipidemia, member will contact this RNCM once she has seen primary care provider for Hgb A1C recheck to determine timing of Link To Wellness follow up          [facility] will fax referral to RD in [place]. RNCM to fax today's office visit note to Dr. Allyne Gee. RNCM will meet quarterly and as needed with patient per Link To Wellness program guidelines to assist with hyperlipidemia self-management and assess patient's progress toward mutually set  goals.  Bary Richard RN,CCM,CDE [facility] Care Management Coordinator Link To Wellness Office Phone [phone] Office Fax [phone]

## 2015-07-23 MED FILL — LIVALO 4 MG TABLET: 4 | 30 days supply | Qty: 20 | Fill #2

## 2015-07-26 ENCOUNTER — Encounter: Payer: 59 | Attending: Internal Medicine | Admitting: Nutrition

## 2015-07-26 ENCOUNTER — Encounter: Payer: Self-pay | Admitting: Nutrition

## 2015-07-26 VITALS — Ht 60.0 in | Wt 108.0 lb

## 2015-07-26 DIAGNOSIS — E785 Hyperlipidemia, unspecified: Secondary | ICD-10-CM | POA: Insufficient documentation

## 2015-07-26 DIAGNOSIS — E119 Type 2 diabetes mellitus without complications: Secondary | ICD-10-CM

## 2015-07-26 NOTE — Progress Notes (Signed)
Diabetes Self-Management Education  Visit Type: First/Initial  Appt. Start Time: 0830 Appt. End Time: 0930  07/26/2015  Ms. Jaime Rodriguez, identified by name and date of birth, is a 65 y.o. female with a diagnosis of Diabetes: Type 2.  Primary concerns today: Diabetes. A1C 6.5%. Lives by herself with her 2 nieces. Eats three meals per day.  Not testing blood sugars. Currently trying to control with diet and exercise. Has some hearing loss in both ears.  She notes she needs hearing aids but hasn't gotten them yet. [facility] [employer]. Not testing blood sugars. Usual weight is 106-108 lbs. Gets botox injections from facial drop on left side. Denies swallowing issues but prefers to chew on left side. Has some slurred speech at times due to muscle weakness. Has 1 obese niece who has DM Type 2.  NO family history of DM. Sees Elizebeth KollerJanet Houser with [facility].  Diet is fairly healthy. Doesn't eat enough protein and carbs at meals.    ASSESSMENT  Height 5' (1.524 m), weight 108 lb (48.988 kg). Body mass index is 21.09 kg/(m^2).      Diabetes Self-Management Education - 07/26/15 0843    Visit Information   Visit Type First/Initial   Initial Visit   Diabetes Type Type 2   Are you currently following a meal plan? Yes   What type of meal plan do you follow? No sugars or sweets   Are you taking your medications as prescribed? Not on Medications   Health Coping   How would you rate your overall health? Excellent   Psychosocial Assessment   Patient Belief/Attitude about Diabetes Motivated to manage diabetes   Self-care barriers Hard of hearing   Self-management support Doctor's office;Friends;Family   Other persons present Patient   Patient Concerns Nutrition/Meal planning;Monitoring;Glycemic Control;Healthy Lifestyle   Special Needs None   Preferred Learning Style Visual;Hands on   Learning Readiness Ready   How often do you need to have someone help you when you read instructions, pamphlets, or other  written materials from your doctor or pharmacy? 1 - Never   What is the last grade level you completed in school? 12   Complications   Last HgB A1C per patient/outside source 6.5 %   How often do you check your blood sugar? 0 times/day (not testing)   Have you had a dilated eye exam in the past 12 months? No   Have you had a dental exam in the past 12 months? No   Are you checking your feet? No   Dietary Intake   Breakfast Eggs and 1 slice toast, water   Snack (morning) fruit sometimes   Lunch salad and chicken and water   Snack (afternoon) chex mix or popcorn   Dinner meat and veggies usually   Snack (evening) popcorn or chex mix   Beverage(s) water   Exercise   Exercise Type Light (walking / raking leaves)   How many days per week to you exercise? 2   How many minutes per day do you exercise? 15   Total minutes per week of exercise 30   Patient Education   Previous Diabetes Education Yes (please comment)  [facility]   Disease state  Definition of diabetes, type 1 and 2, and the diagnosis of diabetes;Factors that contribute to the development of diabetes;Explored patient's options for treatment of their diabetes   Nutrition management  Role of diet in the treatment of diabetes and the relationship between the three main macronutrients  and blood glucose level;Carbohydrate counting;Reviewed blood glucose goals for pre and post meals and how to evaluate the patients' food intake on their blood glucose level.;Meal timing in regards to the patients' current diabetes medication.   Physical activity and exercise  Role of exercise on diabetes management, blood pressure control and cardiac health.   Monitoring Purpose and frequency of SMBG.   Chronic complications Relationship between chronic complications and blood glucose control;Identified and discussed with patient  current chronic complications   Psychosocial adjustment Worked with patient to identify barriers to care and  solutions;Identified and addressed patients feelings and concerns about diabetes   Personal strategies to promote health Lifestyle issues that need to be addressed for better diabetes care   Individualized Goals (developed by patient)   Nutrition Follow meal plan discussed;General guidelines for healthy choices and portions discussed;Adjust meds/carbs with exercise as discussed   Physical Activity Exercise 3-5 times per week   Monitoring  test blood glucose pre and post meals as discussed   Reducing Risk examine blood glucose patterns;do foot checks daily   Outcomes   Expected Outcomes Demonstrated interest in learning. Expect positive outcomes   Future DMSE 4-6 wks   Program Status Completed      Individualized Plan for Diabetes Self-Management Training:   Learning Objective:  Patient will have a greater understanding of diabetes self-management. Patient education plan is to attend individual and/or group sessions per assessed needs and concerns.   Plan:   Goals 1. Follow My Plate 2. Eat 2-3 carb choices per meal. 3. Eat meals on time as discussed. 4. Be sure to eat protein at every meal 5. Exericise 30 minutes daily. 6. Talk to MD about a meter and testing blood sugars at next visit. 7. Get A1C less than 6% in three months.   Expected Outcomes:  Demonstrated interest in learning. Expect positive outcomes  Education material provided: Living Well with Diabetes, Meal plan card and My Plate  If problems or questions, patient to contact team via:  Phone and Email  Future DSME appointment: 4-6 wks

## 2015-07-26 NOTE — Patient Instructions (Signed)
Goals 1. Follow My Plate 2. Eat 2-3 carb choices per meal. 3. Eat meals on time as discussed. 4. Be sure to eat protein at every meal 5. Exericise 30 minutes daily. 6. Talk to MD about a meter and testing blood sugars at next visit. 7. Get A1C less than 6% in three months.

## 2015-08-20 ENCOUNTER — Telehealth: Payer: Self-pay | Admitting: Neurology

## 2015-08-20 NOTE — Telephone Encounter (Signed)
Patient is calling to discuss Botox. She can be reached today until 2.

## 2015-08-20 NOTE — Telephone Encounter (Signed)
Returned the patients call and spoke with her niece who told me she was not available but would let her know I called.

## 2015-08-23 NOTE — Telephone Encounter (Signed)
Patient called to cancel botox apt and requested to speak with the nurse regarding an order for testing Dr. Terrace ArabiaYan wanted her to have. Please call and advise [phone].

## 2015-08-26 ENCOUNTER — Telehealth: Payer: Self-pay | Admitting: *Deleted

## 2015-08-26 MED FILL — LIVALO 4 MG TABLET: 4 | 30 days supply | Qty: 20 | Fill #0

## 2015-08-26 NOTE — Telephone Encounter (Signed)
Caller Jaime Rodriguez      [name]          CID [id number]  Patient SAME                 Pt's Dr Terrace ArabiaYAN          Area Code 336 Phone# [phone] * DOB [phone]     RE CXL 5/24 APPT @ 9AM                                                                                    Disp:Y/N N If Y = C/B If No Response In 20minutes ============================================================

## 2015-08-31 NOTE — Telephone Encounter (Signed)
Attempted at number below but it was her work number and they were closed.  Attempted mobile number and left message.  I will try her work [employer] account executivetomorrow.

## 2015-09-01 ENCOUNTER — Ambulatory Visit: Payer: 59 | Admitting: Neurology

## 2015-09-01 NOTE — Telephone Encounter (Signed)
Called patient at work - she was busy and needed to return my call at a later time.

## 2015-09-01 NOTE — Telephone Encounter (Signed)
Spoke to patient - states two years ago, Dr. Terrace ArabiaYan was going to order a cervical MRI (hyperreflexia, urinary urgency).  She would like to see her to discuss symptoms and an appt has been scheduled for her.

## 2015-09-03 ENCOUNTER — Encounter: Payer: 59 | Attending: Internal Medicine | Admitting: Nutrition

## 2015-09-03 ENCOUNTER — Encounter: Payer: Self-pay | Admitting: Nutrition

## 2015-09-03 VITALS — Ht 60.0 in | Wt 108.8 lb

## 2015-09-03 DIAGNOSIS — E785 Hyperlipidemia, unspecified: Secondary | ICD-10-CM | POA: Insufficient documentation

## 2015-09-03 DIAGNOSIS — E119 Type 2 diabetes mellitus without complications: Secondary | ICD-10-CM

## 2015-09-03 NOTE — Patient Instructions (Signed)
Goals Keep up the good work!!!  Eat 2-3 carb choices per meal. Keep exercising 30 minutes or more most days of the week. Get A1C checked. Don't skip meals. Call if you have any questions or need further assistance.

## 2015-09-03 NOTE — Progress Notes (Signed)
Diabetes Self-Management Education  Visit Type:  Follow-up  Appt. Start Time: 0830 Appt. End Time: 0900  09/03/2015  Jaime Rodriguez, identified by name and date of birth, is a 66 y.o. female with a diagnosis of Diabetes: Type 2.  She is working on planning her meals better and watching portion sizes. She notes she has to schedule appt with her provider to get her A1C done to see if the changes she made has made progress. She has tested her BS in the am occasionally and they have been in the upper 70's.  ASSESSMENT Wt 108.8 lbs  There were no vitals taken for this visit. There is no weight on file to calculate BMI.       Diabetes Self-Management Education - 09/03/15 0803    Health Coping   How would you rate your overall health? Good   Psychosocial Assessment   Patient Belief/Attitude about Diabetes Motivated to manage diabetes   Self-care barriers None   Self-management support Friends;Family   Patient Concerns Nutrition/Meal planning;Problem Solving;Glycemic Control;Weight Control   Special Needs None   Preferred Learning Style No preference indicated   Learning Readiness Change in progress   Pre-Education Assessment   Patient understands the diabetes disease and treatment process. Demonstrates understanding / competency   Patient understands incorporating nutritional management into lifestyle. Demonstrates understanding / competency   Patient undertands incorporating physical activity into lifestyle. Demonstrates understanding / competency   Patient understands using medications safely. Demonstrates understanding / competency   Patient understands monitoring blood glucose, interpreting and using results Demonstrates understanding / competency   Patient understands prevention, detection, and treatment of acute complications. Demonstrates understanding / competency   Patient understands prevention, detection, and treatment of chronic complications. Demonstrates understanding /  competency   Patient understands how to develop strategies to address psychosocial issues. Demonstrates understanding / competency   Patient understands how to develop strategies to promote health/change behavior. Demonstrates understanding / competency   Complications   Fasting Blood glucose range (mg/dL) 16-109   Number of hypoglycemic episodes per month 0   Number of hyperglycemic episodes per week 0   Have you had a dilated eye exam in the past 12 months? No   Have you had a dental exam in the past 12 months? No   Are you checking your feet? Yes   How many days per week are you checking your feet? 7   Dietary Intake   Breakfast Oatmeals and raisin and cinnamon, water   Lunch Sttring beans, collard greens and cheeese, water   Dinner  6" sub with pizza sub, water   Beverage(s) water   Exercise   Exercise Type Light (walking / raking leaves)   How many days per week to you exercise? 3   How many minutes per day do you exercise? 30   Total minutes per week of exercise 90   Patient Education   Nutrition management  Carbohydrate counting   Physical activity and exercise  Role of exercise on diabetes management, blood pressure control and cardiac health.   Medications Other (comment)   Monitoring Other (comment)   Patient Self-Evaluation of Goals - Patient rates self as meeting previously set goals (% of time)   Nutrition >75%   Physical Activity >75%   Medications >75%   Monitoring >75%   Problem Solving >75%   Reducing Risk >75%   Health Coping >75%   Post-Education Assessment   Patient understands the diabetes disease and treatment process. Demonstrates understanding / competency  Patient understands incorporating nutritional management into lifestyle. Demonstrates understanding / competency   Patient undertands incorporating physical activity into lifestyle. Demonstrates understanding / competency   Patient understands using medications safely. Demonstrates understanding /  competency   Patient understands monitoring blood glucose, interpreting and using results Demonstrates understanding / competency   Patient understands prevention, detection, and treatment of acute complications. Demonstrates understanding / competency   Patient understands prevention, detection, and treatment of chronic complications. Demonstrates understanding / competency   Patient understands how to develop strategies to address psychosocial issues. Demonstrates understanding / competency   Patient understands how to develop strategies to promote health/change behavior. Demonstrates understanding / competency   Outcomes   Program Status Completed   Subsequent Visit   Since your last visit have you continued or begun to take your medications as prescribed? Yes   Since your last visit have you had your blood pressure checked? Yes   Is your most recent blood pressure lower, unchanged, or higher since your last visit? Lower   Since your last visit have you experienced any weight changes? No change   Since your last visit, are you checking your blood glucose at least once a day? N/A      Learning Objective:  Patient will have a greater understanding of diabetes self-management. Patient education plan is to attend individual and/or group sessions per assessed needs and concerns.   Plan:   Patient Instructions  Goals Keep up the good work!!!  Eat 2-3 carb choices per meal. Keep exercising 30 minutes or more most days of the week. Get A1C checked. Don't skip meals. Call if you have any questions or need further assistance.    Expected Outcomes:    Improved blood sugar control and increased knowledge.  Education material provided: My Plate  If problems or questions, patient to contact team via:  Phone and Email  Future DSME appointment: - PRN

## 2015-09-06 ENCOUNTER — Ambulatory Visit: Payer: 59 | Admitting: Nutrition

## 2015-09-14 ENCOUNTER — Encounter: Payer: Self-pay | Admitting: Neurology

## 2015-09-14 ENCOUNTER — Ambulatory Visit (INDEPENDENT_AMBULATORY_CARE_PROVIDER_SITE_OTHER): Payer: 59 | Admitting: Neurology

## 2015-09-14 VITALS — BP 114/72 | HR 83 | Ht 60.0 in | Wt 108.8 lb

## 2015-09-14 DIAGNOSIS — R3915 Urgency of urination: Secondary | ICD-10-CM | POA: Diagnosis not present

## 2015-09-14 DIAGNOSIS — G513 Clonic hemifacial spasm: Secondary | ICD-10-CM | POA: Diagnosis not present

## 2015-09-14 DIAGNOSIS — R292 Abnormal reflex: Secondary | ICD-10-CM

## 2015-09-14 DIAGNOSIS — G5139 Clonic hemifacial spasm, unspecified: Secondary | ICD-10-CM

## 2015-09-14 NOTE — Progress Notes (Signed)
Chief Complaint  Patient presents with  . Hemifacial Spasms    She has decided to not proceed with Botox injections right now.  She feels it makes her face asymmetrical.  . Hyperreflexia    It was suggested she have a cervical MRI (during her exam two years ago) and she would like to be re-evaluated to see if it would still be appropriate. She was also having urinary urgency at that time but this has improved.   HISTORY:  Jaime Rodriguez is a 65 y.o.  right-handed black female referred by Dr. Jannifer Franklin for EMG guided botulinumtoxin injection for left facial spasm  She had a history of left-sided hemifacial spasm that began approximately a year ago in 2013. The patient was seen by Dr. Melton Alar, and she underwent MRI evaluation of the brain with and without gadolinium that was unremarkable. The patient underwent Botox injections in 2013,   She did well with the Botox, and the benefit lasted almost one year. She did has mild left facial droop after the injection.  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   She denies any balance issues, speech problems or swallowing problems. The patient denies any change in vision. She denies a previous history of left Bell's palsy.  In recent 1 year, since 2014, She began to have urinary urgency, she never had children in the past she denies bilateral upper and lower extremity paresthesia   UPDATE March 7th 2016:  Last injection was in Feb 2015, very helpful, did very well, she noticed recurrence of her left facial twitching, almost constant, she hoped to continue EMG guided xeomin injection for her left facial spasm,   UPDATE April 28th 2016: Her insurance refused xeomin, approved for Botox A 100 unit  UPDATE May 24 2015:She responded very well to previous Botox injection,  came in for repeat injection again  Update September 14 2015: When I met her initially in 2014, she complains of urinary urgency, she has never had a vaginal delivery, her urinary urgency has been fairly stable, she denies gait difficulty, denies bowel and bladder incontinence, denied lower extremity paresthesia, she wants to initiate evaluation, she does has hyperreflexia on examinations,.   Past Medical History  Diagnosis Date  . Allergy   . Diabetes mellitus   . Fibrocystic disease of breast     LEFT BREAST SURGERY  . Hyperlipidemia   . Postmenopausal   . Hemifacial spasm 12/18/2012    Left lower face    Past Surgical History  Procedure Laterality Date  . Breast lumpectomy      Benign lesion    Family History  Problem Relation Age of Onset  . Cancer Mother   . Stroke Father   . Prostate cancer Brother   . Cancer Brother     Social history:  reports that she has never smoked. She has never used smokeless tobacco. She reports that she does not drink alcohol or use illicit drugs.  Medications:  Current Outpatient Prescriptions on File Prior to Visit  Medication Sig Dispense Refill  . aspirin 81 MG tablet Take 81 mg by mouth daily.    . botulinum toxin Type A (BOTOX) 100 UNITS SOLR injection Inject 100 Units into the muscle once.    . carboxymethylcellulose (REFRESH PLUS) 0.5 % SOLN 1 drop 3 (three) times daily  as needed.    . Cholecalciferol (VITAMIN D3) 5000 UNITS CAPS Take by mouth 3 (three) times daily.    . clindamycin (CLEOCIN) 300 MG capsule Take 300 mg by mouth daily as needed. Reported on 07/26/2015    . diclofenac sodium (VOLTAREN) 1 % GEL   3  . docusate sodium (DOCU SOFT) 100 MG capsule Take 100 mg by mouth 2 (two) times daily.    Marland Kitchen LIVALO 4 MG TABS   1  . loratadine (CLARITIN) 10 MG tablet Take 10 mg by mouth daily.    . meclizine (ANTIVERT) 25 MG tablet Take 25 mg by mouth 3 (three) times daily as needed for dizziness.    . valACYclovir (VALTREX) 500 MG tablet Take  500 mg by mouth 2 (two) times daily. Reported on 07/26/2015     No current facility-administered medications on file prior to visit.      Allergies  Allergen Reactions  . Penicillins     ROS:  Out of a complete 14 system review of symptoms, the patient complains only of the following symptoms, and all other reviewed systems are negative. Urinary urgency  Blood pressure 114/72, pulse 83, height 5' (1.524 m), weight 108 lb 12 oz (49.329 kg).  PHYSICAL EXAMNIATION:  Gen: NAD, conversant, well nourised, obese, well groomed                     Cardiovascular: Regular rate rhythm, no peripheral edema, warm, nontender. Eyes: Conjunctivae clear without exudates or hemorrhage Neck: Supple, no carotid bruise. Pulmonary: Clear to auscultation bilaterally   NEUROLOGICAL EXAM:  MENTAL STATUS: Speech:    Speech is normal; fluent and spontaneous with normal comprehension.  Cognition:    The patient is oriented to person, place, and time;     recent and remote memory intact;     language fluent;     normal attention, concentration,     fund of knowledge.  CRANIAL NERVES:  CN II: Visual fields are full to confrontation. Fundoscopic exam is normal with sharp discs and no vascular changes. Venous pulsations are present bilaterally. Pupils are 4 mm and briskly reactive to light.  CN III, IV, VI: extraocular movement are normal. No ptosis. CN V: Facial sensation is intact to pinprick in all 3 divisions bilaterally. Corneal responses are intact.  CN VII: She has occasionally left hemifacial muscle spasm, involving left orbicularis oculi, orbicularis oris, mild left cheek puff weakness. She has mild to moderate left eye closure weakness. CN VIII: Hearing is normal to rubbing fingers CN IX, X: Palate elevates symmetrically. Phonation is normal. CN XI: Head turning and shoulder shrug are intact CN XII: Tongue is midline with normal movements and no atrophy.  MOTOR: There is no pronator drift of  out-stretched arms. Muscle bulk and tone are normal. Muscle strength is normal.   Shoulder abduction Shoulder external rotation Elbow flexion Elbow extension Wrist flexion Wrist extension Finger abduction Hip flexion Knee flexion Knee extension Ankle dorsi flexion Ankle plantar flexion  R _0 L _1 REFLEXES: Reflexes are 2+ and symmetric at the biceps, triceps, knees, and ankles. Plantar responses are flexor.  SENSORY: Light touch, pinprick, position sense, and vibration sense are intact in fingers and toes.  COORDINATION: Rapid alternating movements and fine finger movements are intact. There is no dysmetria on finger-to-nose and heel-knee-shin. There are  no abnormal or extraneous movements.   GAIT/STANCE: Posture is normal. Gait is steady with normal steps, base, arm swing, and turning. Heel and toe walking are normal. Tandem gait is normal.  Romberg is absent.   Assessment/Plan:  Left hemifacial spasm, responded very well to previous EMG guided botulism toxin injection, most recent injection was in February 2017  With mild residual left facial weakness, mild left eye closure cheek puff weakness.  Urinary urgency, hyperreflexia on examinations  Need to rule out cervical spondylitic myelopathy, proceed with MRI of cervical spine,    Marcial Pacas, M.D. Ph.D.  [facility] [place], [place] [address] Phone: [phone] Fax:      [phone]

## 2015-09-14 NOTE — Addendum Note (Signed)
Addended by: Levert FeinsteinYAN, Namon Villarin on: 09/14/2015 04:57 PM   Modules accepted: Orders

## 2015-09-24 MED FILL — LIVALO 4 MG TABLET: 4 | 30 days supply | Qty: 20 | Fill #1

## 2015-10-05 ENCOUNTER — Other Ambulatory Visit (HOSPITAL_COMMUNITY): Payer: Self-pay | Admitting: Obstetrics and Gynecology

## 2015-10-05 DIAGNOSIS — Z1231 Encounter for screening mammogram for malignant neoplasm of breast: Secondary | ICD-10-CM

## 2015-10-11 ENCOUNTER — Other Ambulatory Visit: Payer: Self-pay | Admitting: Adult Health

## 2015-10-11 ENCOUNTER — Ambulatory Visit (HOSPITAL_COMMUNITY): Payer: 59

## 2015-10-11 MED ORDER — SILVER SULFADIAZINE 1 % EX CREA: 1.0000 "application " | TOPICAL_CREAM | Freq: Every day | CUTANEOUS | Status: DC

## 2015-10-18 ENCOUNTER — Ambulatory Visit (HOSPITAL_COMMUNITY)
Admission: RE | Admit: 2015-10-18 | Discharge: 2015-10-18 | Disposition: A | Discharge status: Home / Self Care | Payer: 59 | Source: Ambulatory Visit | Attending: Obstetrics and Gynecology | Admitting: Obstetrics and Gynecology

## 2015-10-18 ENCOUNTER — Other Ambulatory Visit: Payer: Self-pay | Admitting: *Deleted

## 2015-10-18 DIAGNOSIS — Z1231 Encounter for screening mammogram for malignant neoplasm of breast: Secondary | ICD-10-CM | POA: Diagnosis not present

## 2015-10-19 ENCOUNTER — Telehealth: Payer: Self-pay | Admitting: Neurology

## 2015-10-19 NOTE — Patient Outreach (Signed)
Spoke with Namira at her work number to arrange follow up link To Wellness visit. Per her request, emailed her available times to meet a the [facility] [place] office. Await her reply. Bary RichardJanet S. Hauser RN,CCM,CDE [facility] Care Management Coordinator Link To Wellness Office Phone [phone] Office Fax [phone]

## 2015-10-19 NOTE — Telephone Encounter (Signed)
Pt called in to cancell appt today.  pt [facility] b/c [facility]. Pt will call back to r/s

## 2015-10-20 ENCOUNTER — Ambulatory Visit: Payer: 59 | Admitting: Neurology

## 2015-10-26 MED FILL — LIVALO 4 MG TABLET: 4 | 30 days supply | Qty: 20 | Fill #2

## 2015-11-09 ENCOUNTER — Other Ambulatory Visit: Payer: Self-pay | Admitting: Adult Health

## 2015-11-09 MED ORDER — SCOPOLAMINE 1 MG/3DAYS TD PT72: 1.0000 | MEDICATED_PATCH | TRANSDERMAL | 0 refills | Status: DC

## 2015-12-06 ENCOUNTER — Ambulatory Visit: Payer: Self-pay | Admitting: *Deleted

## 2015-12-14 ENCOUNTER — Encounter: Payer: Self-pay | Admitting: *Deleted

## 2015-12-14 NOTE — Patient Outreach (Signed)
[facility] stating that she needs to reschedule for Link To Wellness follow up appointment by 9/15 or be terminated from the program. This is in addition to an e-mail sent to her on 7/10 with same request. Bary RichardJanet S. Maycel Riffe RN,CCM,CDE [facility] Care Management Coordinator Link To Wellness Office Phone [phone] Office Fax [phone]

## 2015-12-27 ENCOUNTER — Encounter: Payer: Self-pay | Admitting: *Deleted

## 2015-12-30 DIAGNOSIS — E782 Mixed hyperlipidemia: Secondary | ICD-10-CM | POA: Diagnosis not present

## 2015-12-30 DIAGNOSIS — R7309 Other abnormal glucose: Secondary | ICD-10-CM | POA: Diagnosis not present

## 2016-01-03 ENCOUNTER — Other Ambulatory Visit: Payer: Self-pay | Admitting: *Deleted

## 2016-01-03 MED FILL — LIVALO 4 MG TABLET: 4 | 28 days supply | Qty: 20 | Fill #0

## 2016-01-03 NOTE — Patient Outreach (Addendum)
[facility] [facility]) Care Management   01/03/16  Jaime Rodriguez February 12, 1951 [id number]  Jaime Rodriguez is an 65 y.o. female who presents to the [place] Management office for routine Link To Wellness follow up  for self management assistance with hyperlipidemia and diabetes.  Subjective:  Jaime Rodriguez states she ran out of cholesterol medicine so she suspects her lipid panel done on 12/30/15 will not be normal. She also says she has not been exercising as much as she was previously.  She states she met with a registered dietician in May and is focusing on a CHO controlled meal plan. She is also checking her blood sugar randomly and reports all self monitored blood sugars are normal except for one low fasting blood sugar of 57.  She states she went too long without eating.  Objective:   Review of Systems  Constitutional: Negative.     Physical Exam  Constitutional: She is oriented to person, place, and time. She appears well-developed and well-nourished.  Cardiovascular: Normal rate and regular rhythm.   Respiratory: Effort normal.  Neurological: She is alert and oriented to person, place, and time.  Skin: Skin is warm and dry.  Psychiatric: She has a normal mood and affect. Her behavior is normal. Judgment and thought content normal.   Filed Weights   01/03/16 1808  Weight: 110 lb (49.9 kg)   There were no vitals filed for this visit. Encounter Medications:   Outpatient Encounter Prescriptions as of 07/14/2015  Medication Sig Note  . aspirin 81 MG tablet Take 81 mg by mouth daily.   . carboxymethylcellulose (REFRESH PLUS) 0.5 % SOLN 1 drop 3 (three) times daily as needed.   . Cholecalciferol (VITAMIN D3) 5000 UNITS CAPS Take by mouth 3 (three) times daily.   . clindamycin (CLEOCIN) 300 MG capsule Take 300 mg by mouth daily as needed. Prior to dental work   . diclofenac sodium (VOLTAREN) 1 % GEL  07/14/2015: Use prn  . docusate sodium (DOCU SOFT) 100 MG  capsule Take 100 mg by mouth 2 (two) times daily. 07/14/2015: 200 mg daily  . LIVALO 4 MG TABS  05/24/2015: Received from: External Pharmacy  . loratadine (CLARITIN) 10 MG tablet Take 10 mg by mouth daily. 07/14/2015: Takes prn  . meclizine (ANTIVERT) 25 MG tablet Take 25 mg by mouth 3 (three) times daily as needed for dizziness.   . ezetimibe-simvastatin (VYTORIN) 10-20 MG per tablet Take 1 tablet by mouth daily. Reported on 07/14/2015   . valACYclovir (VALTREX) 500 MG tablet Take 500 mg by mouth 2 (two) times daily. Reported on 07/14/2015 07/14/2015: Uses prn   No facility-administered encounter medications on file as of 07/14/2015.    Functional Status:   In your present state of health, do you have any difficulty performing the following activities: 07/14/2015  Hearing? Y  Vision? N  Difficulty concentrating or making decisions? N  Walking or climbing stairs? N  Dressing or bathing? N  Doing errands, shopping? N  Some recent data might be hidden    Fall/Depression Screening:    PHQ 2/9 Scores 09/03/2015 07/26/2015 07/14/2015  PHQ - 2 Score 0 0 0    Assessment:   [employer] employee  with hyperlipidemia- not meeting treatment targets for total cholesterol and LDL- and new diagnosis of Type II Diabetes.   Plan:  [facility] CM Care Plan Problem One        Most Recent Value   Care Plan Problem One  Member with hyperlipidemia with elevated total cholesterol and LDL despite care plan adherence and most recent Hgb A1C= 6.5%    Role Documenting the Problem One  Care Management [place] for Problem One  Active   THN Long Term Goal (31-90 days)  Improved control of lipids as evidenced by improved lipid profile at next assessment and improved glycemic control as evidenced by Hgb A1C <6.4% and 75% of self monitored CBGs meeting target.   THN Long Term Goal Start Date  01/03/16   Interventions for Problem One Long Term Goal  Reviewed medications and medication adherence, reviewed results of labs  drawn 12/30/15 including lipid profile and Hgb A1C.  Reviewed CBG log, reviewed treatment for hypoglycemia and strategies to prevent future episodes, reviewed meal plan and activity adherence, provided blood sugar log sheets with targets for normal blood sugars, encouraged patient to check fasting for one week, if most values meeting target, then check post meal and hs and early morning until blood sugars higher than target are seen, reviewed visit notes from patient's visit with RD on 09/03/15 including goals,  reviewed benefits of physical activity on lipid levels,  insulin sensitivity, assisting with weight management and cardiovascular health, encouraged patient to continue to exercise, arranged for Link To Wellness follow up in one month      RNCM to fax today's office visit note to Dr. Baird Cancer. RNCM will meet quarterly and as needed with patient per Link To Wellness program guidelines to assist with hyperlipidemia and diabetes self-management and assess patient's progress toward mutually set goals.  Barrington Ellison RN,CCM,CDE [place] Management Coordinator Link To Wellness Office Phone [phone] Office Fax [phone]

## 2016-01-13 MED FILL — TRUE METRIX GLUCOSE TEST ST: 50 days supply | Qty: 100 | Fill #0

## 2016-01-13 MED FILL — TRUEplus LANCETS 30G MISC: 50 days supply | Qty: 100 | Fill #0

## 2016-01-31 ENCOUNTER — Other Ambulatory Visit: Payer: Self-pay | Admitting: *Deleted

## 2016-01-31 NOTE — Patient Outreach (Addendum)
[facility]) Care Management   01/31/16  Jaime Rodriguez 08/01/1950 [id number]  Jaime Rodriguez is an 65 y.o. female who presents to the [facility] [employer] Care Management office for routine Link To Wellness follow up for self management assistance with hyperlipidemia and new diagnosis of Type II DM.   Subjective:  Jaime Rodriguez states she has been checking her blood sugar 2 times daily , a fasting and 2 hours after dinner and only has 2 readings that did not meet target for people without Type II diabetes. Both blood sugars were post supper and were 135 and 142.  She says she attributes the higher blood sugars to eating out and eating at a relative's house. She denies hypoglycemia.   Objective:   Review of Systems  Constitutional: Negative.     Physical Exam  Constitutional: She is oriented to person, place, and time. She appears well-developed and well-nourished.  Cardiovascular: Normal rate and regular rhythm.   Respiratory: Effort normal.  Neurological: She is alert and oriented to person, place, and time.  Skin: Skin is warm and dry.  Psychiatric: She has a normal mood and affect. Her behavior is normal. Judgment and thought content normal.   Filed Weights   01/31/16 1735  Weight: 111 lb (50.3 kg)   Vitals:   01/31/16 1735  BP: 100/70   Encounter Medications:   Outpatient Encounter Prescriptions as of 07/14/2015  Medication Sig Note  . aspirin 81 MG tablet Take 81 mg by mouth daily.   . carboxymethylcellulose (REFRESH PLUS) 0.5 % SOLN 1 drop 3 (three) times daily as needed.   . Cholecalciferol (VITAMIN D3) 5000 UNITS CAPS Take by mouth 3 (three) times daily.   . clindamycin (CLEOCIN) 300 MG capsule Take 300 mg by mouth daily as needed. Prior to dental work   . diclofenac sodium (VOLTAREN) 1 % GEL  07/14/2015: Use prn  . docusate sodium (DOCU SOFT) 100 MG capsule Take 100 mg by mouth 2 (two) times daily. 07/14/2015: 200 mg daily  . LIVALO 4 MG TABS   05/24/2015: Received from: External Pharmacy  . loratadine (CLARITIN) 10 MG tablet Take 10 mg by mouth daily. 07/14/2015: Takes prn  . meclizine (ANTIVERT) 25 MG tablet Take 25 mg by mouth 3 (three) times daily as needed for dizziness.   . ezetimibe-simvastatin (VYTORIN) 10-20 MG per tablet Take 1 tablet by mouth daily. Reported on 07/14/2015   . valACYclovir (VALTREX) 500 MG tablet Take 500 mg by mouth 2 (two) times daily. Reported on 07/14/2015 07/14/2015: Uses prn   No facility-administered encounter medications on file as of 07/14/2015.    Functional Status:   In your present state of health, do you have any difficulty performing the following activities: 07/14/2015  Hearing? Y  Vision? N  Difficulty concentrating or making decisions? N  Walking or climbing stairs? N  Dressing or bathing? N  Doing errands, shopping? N  Some recent data might be hidden    Fall/Depression Screening:    PHQ 2/9 Scores 09/03/2015 07/26/2015 07/14/2015  PHQ - 2 Score 0 0 0    Assessment:   [employer] employee  with hyperlipidemia- not meeting treatment targets for total cholesterol and LDL- and new diagnosis of Type II Diabetes.   Plan:  [facility] CM Care Plan Problem One        Most Recent Value   Care Plan Problem One  Member with hyperlipidemia with elevated total cholesterol and LDL despite care plan adherence and  most recent Hgb A1C= 6.5% , >75% of fasting and post meal blood sugars meeting target   Role Documenting the Problem One  Care Management Coordinator   Care Plan for Problem One  Active   THN Long Term Goal (31-90 days)  Improved control of lipids as evidenced by improved lipid profile at next assessment and improved glycemic control as evidenced by Hgb A1C <6.4% and 75% of self monitored CBGs meeting target.   THN Long Term Goal Start Date  02/01/16   Interventions for Problem One Long Term Goal  Reviewed CBG log and positive feedback provided for consistent blood sugar checks, since all fasting numbers  are in range suggested she check premeal and post meal at the biggest meal of the day and return in one month for follow up and POC Hgb A1C check      RNCM will meet monthly with patient per Link To Wellness program guidelines to assist with hyperlipidemia and diabetes self-management and assess patient's progress toward mutually set goals.  Jaime Rodriguez S. Hauser RN,CCM,CDE [facility] Care Management Coordinator Link To Wellness Office Phone [phone] Office Fax [phone]

## 2016-02-01 MED FILL — LIVALO 4 MG TABLET: 4 | 28 days supply | Qty: 20 | Fill #1

## 2016-02-08 ENCOUNTER — Other Ambulatory Visit: Payer: 59

## 2016-03-08 ENCOUNTER — Other Ambulatory Visit: Payer: Self-pay | Admitting: *Deleted

## 2016-03-08 VITALS — BP 114/78 | Ht 60.0 in | Wt 108.0 lb

## 2016-03-08 DIAGNOSIS — R7309 Other abnormal glucose: Secondary | ICD-10-CM

## 2016-03-08 LAB — POCT GLYCOSYLATED HEMOGLOBIN (HGB A1C): Hemoglobin A1C: 6.1

## 2016-03-08 LAB — POCT CBG (FASTING - GLUCOSE)-MANUAL ENTRY: Glucose Fasting, POC: 80 mg/dL (ref 70–99)

## 2016-03-08 NOTE — Patient Outreach (Signed)
[facility]) Care Management   03/08/16  Jaime Rodriguez [birth year] [id number]  Jaime Rodriguez is an 65 y.o. female who presents to the [facility] Care Management office for routine Link To Wellness follow up for self management assistance with hyperlipidemia and new diagnosis of Type II DM.   Subjective:  Jaime Rodriguez states she has been checking her blood sugar 2 times daily, before and after supper and only has 2 readings that did not meet target for people without Type II diabetes. Both blood sugars were post supper and were 207 and 149.  She says she makes notes on what she ate when her blood sugars are not normal.  She defines her hypoglycemic threshold at <80. She states she takes all of her medications as prescribed, she consistently follows a CHO controlled meal plan and walks 5 times weekly.   Objective:   Jaime Rodriguez does not have a Smartphone so she does not qualify to participate in the [employer] digital assistant wellness program so she will continue in the Link To Wellness program for chronic disease self management assistance.    Review of Systems  Constitutional: Negative.     Physical Exam  Constitutional: She is oriented to person, place, and time. She appears well-developed and well-nourished.  Cardiovascular: Normal rate and regular rhythm.   Respiratory: Effort normal.  Neurological: She is alert and oriented to person, place, and time.  Skin: Skin is warm and dry.  Psychiatric: She has a normal mood and affect. Her behavior is normal. Judgment and thought content normal.   Filed Weights   03/08/16 1732  Weight: 108 lb (49 kg)   Vitals:   03/08/16 1732  BP: 114/78   Encounter Medications:   Outpatient Encounter Prescriptions as of 07/14/2015  Medication Sig Note  . aspirin 81 MG tablet Take 81 mg by mouth daily.   . carboxymethylcellulose (REFRESH PLUS) 0.5 % SOLN 1 drop 3 (three) times daily as needed.   . Cholecalciferol  (VITAMIN D3) 5000 UNITS CAPS Take by mouth 3 (three) times daily.   . clindamycin (CLEOCIN) 300 MG capsule Take 300 mg by mouth daily as needed. Prior to dental work   . diclofenac sodium (VOLTAREN) 1 % GEL  07/14/2015: Use prn  . docusate sodium (DOCU SOFT) 100 MG capsule Take 100 mg by mouth 2 (two) times daily. 07/14/2015: 200 mg daily  . LIVALO 4 MG TABS  05/24/2015: Received from: External Pharmacy  . loratadine (CLARITIN) 10 MG tablet Take 10 mg by mouth daily. 07/14/2015: Takes prn  . meclizine (ANTIVERT) 25 MG tablet Take 25 mg by mouth 3 (three) times daily as needed for dizziness.   . ezetimibe-simvastatin (VYTORIN) 10-20 MG per tablet Take 1 tablet by mouth daily. Reported on 07/14/2015   . valACYclovir (VALTREX) 500 MG tablet Take 500 mg by mouth 2 (two) times daily. Reported on 07/14/2015 07/14/2015: Uses prn   No facility-administered encounter medications on file as of 07/14/2015.    Functional Status:   In your present state of health, do you have any difficulty performing the following activities: 07/14/2015  Hearing? Y  Vision? N  Difficulty concentrating or making decisions? N  Walking or climbing stairs? N  Dressing or bathing? N  Doing errands, shopping? N  Some recent data might be hidden    Fall/Depression Screening:    PHQ 2/9 Scores 09/03/2015 07/26/2015 07/14/2015  PHQ - 2 Score 0 0 0    Assessment:   [employer] employee  with hyperlipidemia- not meeting treatment targets for total cholesterol and LDL- and new diagnosis of Type II Diabetes with today's POC HGB A1C= 6.1%, previously 6.5%.    Plan:  [facility] CM Care Plan Problem One        Most Recent Value   Care Plan Problem One  Member with hyperlipidemia with elevated total cholesterol and LDL despite care plan adherence, with improved glycemic control as evidenced by today's POC  Hgb A1C= 6.1% with >90% of fasting and post meal blood sugars meeting target   Role Documenting the Problem One  Care Management Coordinator   Care  Plan for Problem One  Active   THN Long Term Goal (31-90 days)  Improved control of lipids as evidenced by improved lipid profile at next assessment and ongoing good glycemic control as evidenced by Hgb A1C <6.4% and at least 75% of self monitored CBGs meeting target.   THN Long Term Goal Start Date  03/08/16   Interventions for Problem One Long Term Goal  Assessed POC Hgb A1C and POC CBG and discussed results and correlation to estimated average glucose, reviewed CBG log and positive feedback provided for consistent blood sugar checks in pairs at supper, since most numbers are normal for someone without diabetes,  suggested she always immediately recheck any value >200 and randomly check premeal and post meal at the different  meals of the day , will arrange for Link To wellness follow up in January per patient request     RNCM will fax today's note to Dr. Allyne GeeSanders.  RNCM will meet every other  Month with patient per her request  to assist with hyperlipidemia and diabetes self-management and assess patient's progress toward mutually set goals.  Bary RichardJanet S. Hauser RN,CCM,CDE [facility] Care Management Coordinator Link To Wellness Office Phone [phone] Office Fax [phone]

## 2016-03-24 MED FILL — TRUEplus LANCETS 30G MISC: 50 days supply | Qty: 100 | Fill #0

## 2016-03-24 MED FILL — LIVALO 4 MG TABLET: 4 | 28 days supply | Qty: 20 | Fill #2

## 2016-03-27 MED FILL — TRUE METRIX GLUCOSE TEST ST: 50 days supply | Qty: 100 | Fill #0

## 2016-04-27 ENCOUNTER — Encounter (HOSPITAL_COMMUNITY): Payer: Self-pay | Admitting: Emergency Medicine

## 2016-04-27 ENCOUNTER — Ambulatory Visit (HOSPITAL_COMMUNITY)
Admission: EM | Admit: 2016-04-27 | Discharge: 2016-04-27 | Disposition: A | Discharge status: Home / Self Care | Payer: 59 | Attending: Emergency Medicine | Admitting: Emergency Medicine

## 2016-04-27 DIAGNOSIS — R0982 Postnasal drip: Secondary | ICD-10-CM | POA: Diagnosis not present

## 2016-04-27 DIAGNOSIS — J069 Acute upper respiratory infection, unspecified: Secondary | ICD-10-CM | POA: Diagnosis not present

## 2016-04-27 DIAGNOSIS — R05 Cough: Secondary | ICD-10-CM | POA: Diagnosis not present

## 2016-04-27 DIAGNOSIS — J9801 Acute bronchospasm: Secondary | ICD-10-CM

## 2016-04-27 DIAGNOSIS — R059 Cough, unspecified: Secondary | ICD-10-CM

## 2016-04-27 MED ORDER — PHENYLEPHRINE-CHLORPHEN-DM 10-4-12.5 MG/5ML PO LIQD: 5.0000 mL | ORAL | 0 refills | Status: DC | PRN

## 2016-04-27 MED ORDER — ALBUTEROL SULFATE HFA 108 (90 BASE) MCG/ACT IN AERS: 2.0000 | INHALATION_SPRAY | RESPIRATORY_TRACT | 0 refills | Status: DC | PRN

## 2016-04-27 NOTE — ED Provider Notes (Signed)
CSN: [id number]     Arrival date & time 04/27/16  1331 History   First MD Initiated Contact with Patient 04/27/16 1457     Chief Complaint  Patient presents with  . URI   (Consider location/radiation/quality/duration/timing/severity/associated sxs/prior Treatment) 66 year old female complaining of a cough for 3 weeks. Associated with nasal congestion, PND and white to gray sputum. Her medications include Tussionex, Mucinex and Claritin and she is also taken to clindamycin pills for her teeth.  no history of bronchospasm, smoking or asthma.      Past Medical History:  Diagnosis Date  . Allergy   . Diabetes mellitus   . Fibrocystic disease of breast    LEFT BREAST SURGERY  . Hemifacial spasm 12/18/2012   Left lower face  . Hyperlipidemia   . Postmenopausal    Past Surgical History:  Procedure Laterality Date  . BREAST LUMPECTOMY     Benign lesion   Family History  Problem Relation Age of Onset  . Cancer Mother   . Stroke Father   . Prostate cancer Brother   . Cancer Brother    Social History  Substance Use Topics  . Smoking status: Never Smoker  . Smokeless tobacco: Never Used  . Alcohol use No   OB History    No data available     Review of Systems  Constitutional: Negative.  Negative for activity change, appetite change, chills, fatigue and fever.  HENT: Positive for congestion, postnasal drip and rhinorrhea. Negative for facial swelling and sore throat.   Eyes: Negative.   Respiratory: Positive for cough. Negative for shortness of breath.   Cardiovascular: Negative.   Musculoskeletal: Negative for neck pain and neck stiffness.  Skin: Negative for pallor and rash.  Neurological: Negative.   All other systems reviewed and are negative.   Allergies  Penicillins  Home Medications   Prior to Admission medications   Medication Sig Start Date End Date Taking? Authorizing Provider  aspirin 81 MG tablet Take 81 mg by mouth daily.   Yes Historical Provider, MD   docusate sodium (DOCU SOFT) 100 MG capsule Take 100 mg by mouth 2 (two) times daily.   Yes Historical Provider, MD  LIVALO 4 MG TABS  05/14/15  Yes Historical Provider, MD  loratadine (CLARITIN) 10 MG tablet Take 10 mg by mouth daily.   Yes Historical Provider, MD  albuterol (PROVENTIL HFA;VENTOLIN HFA) 108 (90 Base) MCG/ACT inhaler Inhale 2 puffs into the lungs every 4 (four) hours as needed for wheezing or shortness of breath. 04/27/16   Hayden Rasmussenavid Zaleigh Bermingham, NP  botulinum toxin Type A (BOTOX) 100 UNITS SOLR injection Inject 100 Units into the muscle once.    Historical Provider, MD  carboxymethylcellulose (REFRESH PLUS) 0.5 % SOLN 1 drop 3 (three) times daily as needed.    Historical Provider, MD  Cholecalciferol (VITAMIN D3) 5000 UNITS CAPS Take by mouth 3 (three) times daily.    Historical Provider, MD  diclofenac sodium (VOLTAREN) 1 % GEL  05/14/15   Historical Provider, MD  meclizine (ANTIVERT) 25 MG tablet Take 25 mg by mouth 3 (three) times daily as needed for dizziness.    Historical Provider, MD  Phenylephrine-Chlorphen-DM 01-12-11.5 MG/5ML LIQD Take 5 mLs by mouth every 4 (four) hours as needed. 04/27/16   Hayden Rasmussenavid Trimaine Maser, NP   Meds Ordered and Administered this Visit  Medications - No data to display  BP 132/63 (BP Location: Left Arm)   Pulse 104   Temp 98.8 F (37.1 C) (Oral)   Resp  16   SpO2 100%  No data found.   Physical Exam  Constitutional: She is oriented to person, place, and time. She appears well-developed and well-nourished. No distress.  HENT:  Head: Normocephalic and atraumatic.  Right Ear: External ear normal.  Left Ear: External ear normal.  Mouth/Throat: No oropharyngeal exudate.  Right TM occluded with cerumen. Left TM normal. Oropharynx with moderate amount of thick but mostly clear PND. Minor posterior pharyngeal erythema. No swelling or exudate.  Eyes: EOM are normal. Pupils are equal, round, and reactive to light.  Neck: Normal range of motion. Neck supple.   Cardiovascular: Normal rate and regular rhythm.   Murmur heard. Pulmonary/Chest: Effort normal. No respiratory distress.  Mildly prolonged expiratory phase and diminished breath sounds with forced expiration. Forced cough produces occasional distant end expiratory wheeze.  Musculoskeletal: Normal range of motion. She exhibits no edema.  Neurological: She is alert and oriented to person, place, and time.  Skin: Skin is warm and dry.  Psychiatric: She has a normal mood and affect.  Nursing note and vitals reviewed.   Urgent Care Course     Procedures (including critical care time)  Labs Review Labs Reviewed - No data to display  Imaging Review No results found.   Visual Acuity Review  Right Eye Distance:   Left Eye Distance:   Bilateral Distance:    Right Eye Near:   Left Eye Near:    Bilateral Near:         MDM   1. Acute upper respiratory infection   2. Cough   3. PND (post-nasal drip)   4. Bronchospasm    You are receiving a liquid medicine to help with drainage cough and congestion. Take only as directed. This may cause drowsiness. You are also receiving an albuterol hand-held inhaler to use 2 puffs every 4 hours as needed for cough. This will help with bronchospasm which is often associated with prolonged cough. Meds ordered this encounter  Medications  . Phenylephrine-Chlorphen-DM 01-12-11.5 MG/5ML LIQD    Sig: Take 5 mLs by mouth every 4 (four) hours as needed.    Dispense:  120 mL    Refill:  0    Order Specific Question:   Supervising Provider    Answer:   Charm Rings [id number]  . albuterol (PROVENTIL HFA;VENTOLIN HFA) 108 (90 Base) MCG/ACT inhaler    Sig: Inhale 2 puffs into the lungs every 4 (four) hours as needed for wheezing or shortness of breath.    Dispense:  1 Inhaler    Refill:  0    Order Specific Question:   Supervising Provider    Answer:   Micheline Chapman       Hayden Rasmussen, NP 04/27/16 [phone]

## 2016-04-27 NOTE — ED Triage Notes (Signed)
Pt c/o cold sx onset: 3 weeks   Sx include: nasal congestion/drainage, prod cough  Denies: fevers, chills  Taking: Tussinex, Mucinex, Claritin w/no relief.   A&O x4... NAD

## 2016-04-27 NOTE — Discharge Instructions (Signed)
You are receiving a liquid medicine to help with drainage cough and congestion. Take only as directed. This may cause drowsiness. You are also receiving an albuterol hand-held inhaler to use 2 puffs every 4 hours as needed for cough. This will help with bronchospasm which is often associated with prolonged cough.

## 2016-05-02 MED FILL — LIVALO 4 MG TABLET: 4 | 28 days supply | Qty: 20 | Fill #3

## 2016-05-03 ENCOUNTER — Ambulatory Visit (INDEPENDENT_AMBULATORY_CARE_PROVIDER_SITE_OTHER): Payer: 59 | Admitting: Adult Health

## 2016-05-03 ENCOUNTER — Encounter: Payer: Self-pay | Admitting: Adult Health

## 2016-05-03 VITALS — BP 120/60 | HR 86 | Ht 60.0 in | Wt 110.0 lb

## 2016-05-03 DIAGNOSIS — R05 Cough: Secondary | ICD-10-CM

## 2016-05-03 DIAGNOSIS — J40 Bronchitis, not specified as acute or chronic: Secondary | ICD-10-CM

## 2016-05-03 DIAGNOSIS — R059 Cough, unspecified: Secondary | ICD-10-CM

## 2016-05-03 MED ORDER — AZITHROMYCIN 250 MG PO TABS: ORAL_TABLET | ORAL | 0 refills | Status: DC

## 2016-05-03 MED ORDER — METHYLPREDNISOLONE 4 MG PO TBPK: ORAL_TABLET | ORAL | 0 refills | Status: DC

## 2016-05-03 NOTE — Progress Notes (Signed)
Subjective:     Patient ID: Jaime Rodriguez, femalDominica Rodriguez   DOB: 01-Sep-1950, 66 y.o.   MRN: [id number]  HPI Jaime Rodriguez is a 66 year old black female in complaining of cough and congestion for 4 weeks, is blowing yellow at times.She was seen at Urgent Care 04/26/16 and was given inhaler and cough meds.   Review of Systems +cough for 4 weeks +congestion + blowing yellow Reviewed past medical,surgical, social and family history. Reviewed medications and allergies.     Objective:   Physical Exam BP 120/60 (BP Location: Left Arm, Patient Position: Sitting, Cuff Size: Normal)   Pulse 86   Ht 5' (1.524 m)   Wt 110 lb (49.9 kg)   BMI 21.48 kg/m   PHQ 2 score 0. Skin warm and dry.  Lungs: clear to ausculation bilaterally, but not much air flow heard. Cardiovascular: regular rate and rhythm.   Continue using inhaler and cough meds, but will add Z pack and medrol dose pack.  Assessment:     1. Bronchitis   2. Cough       Plan:     Rx Z pack Rx medrol dose pack Increase fluids Follow up prn

## 2016-05-03 NOTE — Patient Instructions (Signed)
Increase fluids Take Z pack Take medrol dose pack  Follow up prn

## 2016-05-26 DIAGNOSIS — E782 Mixed hyperlipidemia: Secondary | ICD-10-CM | POA: Diagnosis not present

## 2016-05-26 DIAGNOSIS — R7309 Other abnormal glucose: Secondary | ICD-10-CM | POA: Diagnosis not present

## 2016-05-26 MED FILL — LIVALO 4 MG TABLET: 4 | 28 days supply | Qty: 26 | Fill #0

## 2016-05-30 MED FILL — FREESTYLE LANCETS: 50 days supply | Qty: 100 | Fill #0

## 2016-05-30 MED FILL — FREESTYLE LITE TEST STRIP: 50 days supply | Qty: 100 | Fill #0

## 2016-05-30 MED FILL — FREESTYLE LITE METER: 1 days supply | Qty: 1 | Fill #0

## 2016-07-03 ENCOUNTER — Other Ambulatory Visit: Payer: Self-pay | Admitting: *Deleted

## 2016-07-03 NOTE — Patient Outreach (Signed)
Secure e-mail sent to [facility]e at [facility] address asking her to schedule a Link To Wellness follow up [facility] with this RNCM as she has not been seen since 03/08/16. Await patient response. Bary RichardJanet S. Hauser RN,CCM,CDE [facility] Care Management Coordinator Link To Wellness Office Phone [phone] Office Fax [phone]

## 2016-07-04 ENCOUNTER — Other Ambulatory Visit: Payer: Self-pay | Admitting: *Deleted

## 2016-07-04 NOTE — Patient Outreach (Signed)
Jaime Rodriguez returned e-mail requesting she schedule Link To Wellness follow up. Appointment made for May 5,2018 at 5:00 pm. Jaime RichardJanet S. Hauser RN,CCM,CDE [facility] Care Management Coordinator Link To Wellness Office Phone [phone] Office Fax [phone]

## 2016-07-11 MED FILL — LIVALO 4 MG TABLET: 4 | 28 days supply | Qty: 26 | Fill #1

## 2016-07-21 DIAGNOSIS — Z01419 Encounter for gynecological examination (general) (routine) without abnormal findings: Secondary | ICD-10-CM | POA: Diagnosis not present

## 2016-07-21 DIAGNOSIS — Z6821 Body mass index (BMI) 21.0-21.9, adult: Secondary | ICD-10-CM | POA: Diagnosis not present

## 2016-07-21 DIAGNOSIS — M810 Age-related osteoporosis without current pathological fracture: Secondary | ICD-10-CM | POA: Diagnosis not present

## 2016-08-14 ENCOUNTER — Other Ambulatory Visit: Payer: Self-pay | Admitting: *Deleted

## 2016-08-14 NOTE — Patient Outreach (Signed)
[facility]) Care Management   08/14/16  SHANEESE TAIT 1950-05-22 [id number]  Jaime Rodriguez is an 66 y.o. female who presents to the [facility] [employer] Care Management office for routine Link To Wellness follow up for self management assistance with hyperlipidemia and Type II DM.   Subjective:  Jaime Rodriguez states she is still checking her blood sugar but not as often. She continues to vary the time when checking and has identified corn and quinoa as 2 foods that significantly elevate her blood sugar.  She says she makes notes on what she ate when her blood sugars are not normal.  She defines her hypoglycemic threshold at <80. She says she will have lab work done later this month. She states she takes all of her medications as prescribed, she consistently follows a CHO controlled meal plan and walks 5 times weekly. She is also going to start Zumba today and will go twice weekly at the [employer] on Monday and Wednesday at 6 pm.  Objective:   A review of her glucometer since January shows a variance of 68-186. 7 day average = 85 14 day average= 97 30 day average= 102 Jaime Rodriguez does not have a Smartphone so she does not qualify to participate in the [employer] program so she will continue in the [employer] To Wellness program for chronic disease self management assistance.    Review of Systems  Constitutional: Negative.     Physical Exam  Constitutional: She is oriented to person, place, and time. She appears well-developed and well-nourished.  Cardiovascular: Normal rate and regular rhythm.   Respiratory: Effort normal.  Neurological: She is alert and oriented to person, place, and time.  Skin: Skin is warm and dry.  Psychiatric: She has a normal mood and affect. Her behavior is normal. Judgment and thought content normal.   Filed Weights   08/14/16 1736  Weight: 109 lb (49.4 kg)   Vitals:   08/14/16 1736  BP: 104/64    Encounter Medications:   Outpatient Encounter Prescriptions as of 07/14/2015  Medication Sig Note  . aspirin 81 MG tablet Take 81 mg by mouth daily.   . carboxymethylcellulose (REFRESH PLUS) 0.5 % SOLN 1 drop 3 (three) times daily as needed. Uses over the counter generic eye lubricant  . Cholecalciferol (VITAMIN D3) 5000 UNITS CAPS Take by mouth 3 (three) times daily.   . diclofenac sodium (VOLTAREN) 1 % GEL  07/14/2015: Use prn  . LIVALO 4 MG TABS  05/24/2015: Received from: External Pharmacy  . loratadine (CLARITIN) 10 MG tablet Take 10 mg by mouth daily. 07/14/2015: Takes prn  . meclizine (ANTIVERT) 25 MG tablet Take 25 mg by mouth 3 (three) times daily as needed for dizziness.    No facility-administered encounter medications on file as of 07/14/2015.    Functional Status:   No flowsheet data found.  Fall/Depression Screening:    PHQ 2/9 Scores 05/03/2016 09/03/2015 07/26/2015 07/14/2015  PHQ - 2 Score 0 0 0 0    Assessment:   [employer] employee  with hyperlipidemia and Type II DM - not on medication for diabetes.    Plan:  [facility] CM Care Plan Problem One        Most Recent Value   Care Plan Problem One  Member with hyperlipidemia with elevated total cholesterol and LDL despite treatment plan adherence, with good glycemic management  as evidenced by Hgb A1C= 6.1% with >90% of fasting and post meal  blood sugars meeting target   Role Documenting the Problem One  Care Management Coordinator   Care Plan for Problem One  Active   [facility] Long Term Goal (31-90 days)  Improved control of lipids as evidenced by improved lipid profile at next assessment and ongoing good glycemic control as evidenced by Hgb A1C <6.1% and at least 75% of self monitored CBGs meeting target.   [facility] Long Term Goal Start Date  08/14/16   Interventions for Problem One Long Term Goal  Reviewed medications and medication adherence, reviewed meter log  and positive feedback provided for keeping a log of her blood sugars with  comments when her blood sugars are not meeting target, explained that the covered glucometer for Link To Wellness members is the Freestyle due to the change in the pharmacy benefits manager by [facility], requested she e-mail this RNCM the results of her labs that will be drawn later this month, will arrange for Link To Wellness follow up in November since she is not eligible for the [facility] platform because she does not have a smartphone     RNCM will fax today's note to Dr. Allyne GeeSanders.  RNCM will meet with patient every 6 months to assist with hyperlipidemia and diabetes self-management and assess patient's progress toward mutually set goals.  Bary RichardJanet S. Kalicia Dufresne RN,CCM,CDE [facility] Care Management Coordinator Link To Wellness Office Phone [phone] Office Fax [phone]

## 2016-08-23 MED FILL — LIVALO 4 MG TABLET: 4 | 28 days supply | Qty: 26 | Fill #2

## 2016-09-08 MED FILL — FREESTYLE LANCETS: 50 days supply | Qty: 100 | Fill #1

## 2016-09-08 MED FILL — FREESTYLE LITE TEST STRIP: 50 days supply | Qty: 100 | Fill #1

## 2016-10-06 DIAGNOSIS — H6121 Impacted cerumen, right ear: Secondary | ICD-10-CM | POA: Diagnosis not present

## 2016-10-06 DIAGNOSIS — E559 Vitamin D deficiency, unspecified: Secondary | ICD-10-CM | POA: Diagnosis not present

## 2016-10-06 DIAGNOSIS — Z0000 Encounter for general adult medical examination without abnormal findings: Secondary | ICD-10-CM | POA: Diagnosis not present

## 2016-10-06 DIAGNOSIS — R7309 Other abnormal glucose: Secondary | ICD-10-CM | POA: Diagnosis not present

## 2016-10-06 DIAGNOSIS — E782 Mixed hyperlipidemia: Secondary | ICD-10-CM | POA: Diagnosis not present

## 2016-10-09 ENCOUNTER — Ambulatory Visit (HOSPITAL_COMMUNITY)
Admission: RE | Admit: 2016-10-09 | Discharge: 2016-10-09 | Disposition: A | Discharge status: Home / Self Care | Payer: 59 | Source: Ambulatory Visit | Attending: Nurse Practitioner | Admitting: Nurse Practitioner

## 2016-10-09 ENCOUNTER — Other Ambulatory Visit (HOSPITAL_COMMUNITY): Payer: Self-pay | Admitting: Nurse Practitioner

## 2016-10-09 DIAGNOSIS — M1612 Unilateral primary osteoarthritis, left hip: Secondary | ICD-10-CM | POA: Insufficient documentation

## 2016-10-09 DIAGNOSIS — M25552 Pain in left hip: Secondary | ICD-10-CM

## 2016-10-09 DIAGNOSIS — R937 Abnormal findings on diagnostic imaging of other parts of musculoskeletal system: Secondary | ICD-10-CM | POA: Insufficient documentation

## 2016-10-10 MED FILL — LIVALO 4 MG TABLET: 4 | 28 days supply | Qty: 26 | Fill #3

## 2016-10-30 MED FILL — DICLOFENAC SODIUM 1% GEL: 1 | 13 days supply | Qty: 100 | Fill #0

## 2016-10-30 MED FILL — CLINDAMYCIN HCL 300 MG CAPS: 300 | 1 days supply | Qty: 2 | Fill #0

## 2016-11-03 DIAGNOSIS — Z1231 Encounter for screening mammogram for malignant neoplasm of breast: Secondary | ICD-10-CM | POA: Diagnosis not present

## 2016-11-20 MED FILL — LIVALO 4 MG TABLET: 4 | 28 days supply | Qty: 26 | Fill #4

## 2016-11-20 MED FILL — CLINDAMYCIN HCL 300 MG CAPS: 300 | 1 days supply | Qty: 2 | Fill #1

## 2016-12-27 MED FILL — LIVALO 4 MG TABLET: 4 | 28 days supply | Qty: 26 | Fill #5

## 2017-02-06 MED FILL — LIVALO 4 MG TABLET: 4 | 25 days supply | Qty: 24 | Fill #6

## 2017-02-26 ENCOUNTER — Other Ambulatory Visit: Payer: Self-pay | Admitting: *Deleted

## 2017-02-26 NOTE — Patient Outreach (Addendum)
Spoke to patient via phone advising her that disease self-management services will be transitioned from the Link To Wellness program to either [facility] or Active Health Management in 2019 for all [facility] medical plan members.. Ensured that she has completed the health risk assessment on the [email or web address] website. Also advised Gigi Gineggy  that a letter will be mailed to the home residence with details of this transition.   Will close case to Link To Wellness diabetes program. Bary RichardJanet S. Hauser RN,CCM,CDE [facility] Care Management Coordinator Link To Wellness and [employer] Office Phone [phone] Office Fax [phone]

## 2017-03-02 DIAGNOSIS — H5213 Myopia, bilateral: Secondary | ICD-10-CM | POA: Diagnosis not present

## 2017-03-02 DIAGNOSIS — H52223 Regular astigmatism, bilateral: Secondary | ICD-10-CM | POA: Diagnosis not present

## 2017-03-02 DIAGNOSIS — H524 Presbyopia: Secondary | ICD-10-CM | POA: Diagnosis not present

## 2017-03-14 MED FILL — LIVALO 4 MG TABLET: 4 | 28 days supply | Qty: 20 | Fill #0

## 2017-03-28 DIAGNOSIS — E559 Vitamin D deficiency, unspecified: Secondary | ICD-10-CM | POA: Diagnosis not present

## 2017-03-28 DIAGNOSIS — R7309 Other abnormal glucose: Secondary | ICD-10-CM | POA: Diagnosis not present

## 2017-03-28 DIAGNOSIS — E782 Mixed hyperlipidemia: Secondary | ICD-10-CM | POA: Diagnosis not present

## 2017-04-04 ENCOUNTER — Telehealth: Payer: Self-pay | Admitting: Neurology

## 2017-04-04 NOTE — Telephone Encounter (Signed)
Pt's niece called back, she said the pt has returned to work today and can be reached at [phone] opt 2 and ask for her, she will be paged.

## 2017-04-04 NOTE — Telephone Encounter (Signed)
Spoke to Patient and she is scheduled for a  Botox apt. Feb.  Patient would like to come sooner if some one CX. Thanks Annabelle Harmanana.

## 2017-04-04 NOTE — Telephone Encounter (Signed)
Pt is asking for a call re: her Botox, please call

## 2017-04-04 NOTE — Telephone Encounter (Signed)
I have already talked to Patient she is scheduled.

## 2017-04-11 NOTE — Telephone Encounter (Signed)
Noted, thank you

## 2017-04-13 MED FILL — LIVALO 4 MG TABLET: 4 | 28 days supply | Qty: 20 | Fill #1

## 2017-04-20 DIAGNOSIS — M79671 Pain in right foot: Secondary | ICD-10-CM | POA: Diagnosis not present

## 2017-04-20 DIAGNOSIS — E782 Mixed hyperlipidemia: Secondary | ICD-10-CM | POA: Diagnosis not present

## 2017-04-20 MED FILL — DICLOFENAC SODIUM 1% GEL: 1 | 13 days supply | Qty: 100 | Fill #0

## 2017-04-23 ENCOUNTER — Other Ambulatory Visit: Payer: Self-pay | Admitting: Obstetrics and Gynecology

## 2017-04-23 ENCOUNTER — Other Ambulatory Visit (HOSPITAL_COMMUNITY): Payer: Self-pay | Admitting: Nurse Practitioner

## 2017-04-23 DIAGNOSIS — M79671 Pain in right foot: Secondary | ICD-10-CM

## 2017-04-24 ENCOUNTER — Ambulatory Visit (HOSPITAL_COMMUNITY)
Admission: RE | Admit: 2017-04-24 | Discharge: 2017-04-24 | Disposition: A | Discharge status: Home / Self Care | Payer: 59 | Source: Ambulatory Visit | Attending: Nurse Practitioner | Admitting: Nurse Practitioner

## 2017-04-24 DIAGNOSIS — M7731 Calcaneal spur, right foot: Secondary | ICD-10-CM | POA: Diagnosis not present

## 2017-04-24 DIAGNOSIS — M25571 Pain in right ankle and joints of right foot: Secondary | ICD-10-CM | POA: Diagnosis not present

## 2017-04-24 DIAGNOSIS — M79671 Pain in right foot: Secondary | ICD-10-CM | POA: Diagnosis not present

## 2017-05-08 ENCOUNTER — Telehealth: Payer: Self-pay | Admitting: Neurology

## 2017-05-08 NOTE — Telephone Encounter (Signed)
Pt is asking for a call back to know if the Botox has already been [facility] for her 02-06 appointment, please call

## 2017-05-10 NOTE — Telephone Encounter (Signed)
I called the patient back and told her it was [facility] and that I would pick it up once it arrives.

## 2017-05-16 ENCOUNTER — Encounter: Payer: Self-pay | Admitting: Neurology

## 2017-05-16 ENCOUNTER — Ambulatory Visit (INDEPENDENT_AMBULATORY_CARE_PROVIDER_SITE_OTHER): Payer: 59 | Admitting: Neurology

## 2017-05-16 ENCOUNTER — Telehealth: Payer: Self-pay | Admitting: Neurology

## 2017-05-16 VITALS — BP 128/78 | HR 81 | Ht 60.0 in | Wt 112.0 lb

## 2017-05-16 DIAGNOSIS — G5139 Clonic hemifacial spasm, unspecified: Secondary | ICD-10-CM | POA: Diagnosis not present

## 2017-05-16 NOTE — Progress Notes (Signed)
**  Botox 100 units x 1 vial, NDC [id number], Lot [id number], Exp 11/2019, office supply.//mck,rn**

## 2017-05-16 NOTE — Telephone Encounter (Signed)
4-6 month Botox inj.

## 2017-05-16 NOTE — Progress Notes (Signed)
No chief complaint on file.  HISTORY:  Jaime Rodriguez is a 67 y.o.  right-handed black female referred by Dr. Jannifer Franklin for EMG guided botulinumtoxin injection for left facial spasm  She had a history of left-sided hemifacial spasm that began approximately a year ago in 2013. The patient was seen by Dr. Melton Alar, and she underwent MRI evaluation of the brain with and without gadolinium that was unremarkable. The patient underwent Botox injections in 2013,   She did well with the Botox, and the benefit lasted almost one year. She did has mild left facial droop after the injection.  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   She denies any balance issues, speech problems or swallowing problems. The patient denies any change in vision. She denies a previous history of left Bell's palsy.  In recent 1 year, since 2014, She began to have urinary urgency, she never had children in the past she denies bilateral upper and lower extremity paresthesia   UPDATE March 7th 2016:  Last injection was in Feb 2015, very helpful, did very well, she noticed recurrence of her left facial twitching, almost constant, she hoped to continue EMG guided xeomin injection for her left facial spasm,   UPDATE April 28th 2016: Her insurance refused xeomin, approved for Botox A 100 unit  UPDATE May 24 2015:She responded very well to previous Botox injection, came in for repeat injection again  Update September 14 2015: When I met her initially in 2014, she complains of urinary urgency, she has never had a vaginal delivery, her urinary urgency has been fairly stable, she denies gait difficulty, denies bowel and bladder incontinence, denied lower extremity paresthesia, she wants to initiate evaluation, she does has hyperreflexia on  examinations,.  Update May 16, 2017:  Last injection was in June 2017, she responded very well, now with frequent left hemifacial spasm again,    Past Medical History:  Diagnosis Date  . Allergy   . Diabetes mellitus   . Fibrocystic disease of breast    LEFT BREAST SURGERY  . Hemifacial spasm 12/18/2012   Left lower face  . Hyperlipidemia   . Postmenopausal     Past Surgical History:  Procedure Laterality Date  . BREAST LUMPECTOMY     Benign lesion    Family History  Problem Relation Age of Onset  . Cancer Mother   . Other Mother        sepsis  . Stroke Father   . Prostate cancer Brother   . Cancer Maternal Grandmother   . Asthma Sister   . Hyperlipidemia Sister   . Diabetes Sister   . Cancer Brother   . Hypertension Sister   . Anxiety disorder Other   . Diabetes Other   . Hyperlipidemia Other     Social history:  reports that  has never smoked. she has never used smokeless tobacco. She reports that she does not drink alcohol or use drugs.  Medications:  Current Outpatient Medications on File Prior to Visit  Medication Sig Dispense Refill  . albuterol (PROVENTIL HFA;VENTOLIN HFA) 108 (90 Base) MCG/ACT inhaler Inhale 2 puffs into the lungs every 4 (four) hours as needed for wheezing or shortness of breath. 1 Inhaler 0  . aspirin 81 MG tablet Take 81 mg by mouth daily.    Marland Kitchen azithromycin (ZITHROMAX)  250 MG tablet Take 2 today then 1 daily for 4 days 6 tablet 0  . Calcium Carb-Cholecalciferol (CALCIUM 500+D3 PO) Take 1 mg by mouth 3 (three) times daily.    . carboxymethylcellulose (REFRESH PLUS) 0.5 % SOLN 1 drop 3 (three) times daily as needed.    . diclofenac sodium (VOLTAREN) 1 % GEL Apply topically as needed.   3  . docusate sodium (DOCU SOFT) 100 MG capsule Take 100 mg by mouth daily.     Marland Kitchen LIVALO 4 MG TABS daily.   1  . loratadine (CLARITIN) 10 MG tablet Take 10 mg by mouth daily.    . meclizine (ANTIVERT) 25 MG tablet Take 25 mg by mouth 3 (three) times  daily as needed for dizziness.    . methylPREDNISolone (MEDROL DOSEPAK) 4 MG TBPK tablet Take as directed 21 tablet 0  . Phenylephrine-Chlorphen-DM 01-12-11.5 MG/5ML LIQD Take 5 mLs by mouth every 4 (four) hours as needed. 120 mL 0  . psyllium (METAMUCIL) 58.6 % powder Take 1 packet by mouth daily.     No current facility-administered medications on file prior to visit.       Allergies  Allergen Reactions  . Penicillins     ROS:  Out of a complete 14 system review of symptoms, the patient complains only of the following symptoms, and all other reviewed systems are negative. Urinary urgency  Blood pressure 128/78, pulse 81, height 5' (1.524 m), weight 112 lb (50.8 kg).  PHYSICAL EXAMNIATION:  Gen: NAD, conversant, well nourised, obese, well groomed                     Cardiovascular: Regular rate rhythm, no peripheral edema, warm, nontender. Eyes: Conjunctivae clear without exudates or hemorrhage Neck: Supple, no carotid bruise. Pulmonary: Clear to auscultation bilaterally   NEUROLOGICAL EXAM:  MENTAL STATUS: Speech:    Speech is normal; fluent and spontaneous with normal comprehension.  Cognition:    The patient is oriented to person, place, and time;     recent and remote memory intact;     language fluent;     normal attention, concentration,     fund of knowledge.  CRANIAL NERVES:  CN II: Visual fields are full to confrontation. Fundoscopic exam is normal with sharp discs and no vascular changes. Venous pulsations are present bilaterally. Pupils are 4 mm and briskly reactive to light.  CN III, IV, VI: extraocular movement are normal. No ptosis. CN V: Facial sensation is intact to pinprick in all 3 divisions bilaterally. Corneal responses are intact.  CN VII: She has frequent left hemifacial muscle spasm, involving left orbicularis oculi, orbicularis oris, mild left cheek puff weakness. She has mild  left eye closure weakness. CN VIII: Hearing is normal to rubbing  fingers CN IX, X: Palate elevates symmetrically. Phonation is normal. CN XI: Head turning and shoulder shrug are intact CN XII: Tongue is midline with normal movements and no atrophy.  MOTOR: There is no pronator drift of out-stretched arms. Muscle bulk and tone are normal. Muscle strength is normal.  REFLEXES: Reflexes are 2+ and symmetric at the biceps, triceps, knees, and ankles. Plantar responses are flexor.  SENSORY: Light touch, pinprick, position sense, and vibration sense are intact in fingers and toes.  COORDINATION: Rapid alternating movements and fine finger movements are intact. There is no dysmetria on finger-to-nose and heel-knee-shin. There are no abnormal or extraneous movements.   GAIT/STANCE: Posture is normal. Gait is steady with normal steps, base, arm  swing, and turning. Heel and toe walking are normal. Tandem gait is normal.  Romberg is absent.   Assessment/Plan:  Left hemifacial spasm, responded very well to previous EMG guided botulism toxin injection, last injection was in June 2017.  EMG guided Botox a injection use 100 units, dissolvable to 2 cc of normal saline  Left orbicularis oculi, injection was placed at 3,4, 6, 8, 10 oclock (2.5 x5=12.5 units ) Left frontalis 2.5 units x2= 5 units Right frontalis 2.5 x2= 5units  Right corrugate 5 units Left corrugate 5 units Procerus 5 units  Left zygomatic major 2.5 units Left levator labii superior 2.5 units  Left platysmas 10 units  Used total 52.5 units, discard 47.5 units    Marcial Pacas, M.D. Ph.D.  [facility] [place], [place] [address] Phone: [phone] Fax:      [phone]

## 2017-05-17 DIAGNOSIS — G5139 Clonic hemifacial spasm, unspecified: Secondary | ICD-10-CM

## 2017-05-17 MED ORDER — ONABOTULINUMTOXINA 100 UNITS IJ SOLR
100.0000 [IU] | Freq: Once | INTRAMUSCULAR | Status: AC
  Administered 2017-05-17: 100 [IU] via INTRAMUSCULAR

## 2017-05-17 MED FILL — LIVALO 4 MG TABLET: 4 | 28 days supply | Qty: 20 | Fill #2

## 2017-05-23 ENCOUNTER — Ambulatory Visit: Payer: 59 | Admitting: Neurology

## 2017-05-23 NOTE — Telephone Encounter (Signed)
Pt returned Danielle's call. I scheduled next botox appt on 5/22 per her request. Pt has a question about what medication was used at her last injection. She is requesting a call back at [phone].

## 2017-05-23 NOTE — Telephone Encounter (Signed)
I called to schedule the patient but she did not answer, I left a VM asking her to call me back.  °

## 2017-05-24 NOTE — Telephone Encounter (Signed)
I called and spoke with the patient regarding her injections and she just wanted to know if Dr. Terrace ArabiaYan used the same drug as last time. I informed her that she did. She also wanted to know about the denial she received from the pharmacy. I informed her that we used B/B because her pharmacy benefits denied it.

## 2017-06-18 MED FILL — LIVALO 4 MG TABLET: 4 | 28 days supply | Qty: 20 | Fill #3

## 2017-06-25 MED FILL — DICLOFENAC SODIUM 1% GEL: 1 | 13 days supply | Qty: 100 | Fill #1

## 2017-07-03 MED FILL — PREVIDENT 5000 ENAMEL PROTE: 1.1-5 | 30 days supply | Qty: 100 | Fill #0

## 2017-07-19 MED FILL — LIVALO 4 MG TABLET: 4 | 28 days supply | Qty: 20 | Fill #4

## 2017-08-07 MED FILL — DICLOFENAC SODIUM 1% GEL: 1 | 13 days supply | Qty: 100 | Fill #2

## 2017-08-20 MED FILL — LIVALO 4 MG TABLET: 4 | 28 days supply | Qty: 20 | Fill #5

## 2017-08-27 ENCOUNTER — Telehealth: Payer: Self-pay | Admitting: Neurology

## 2017-08-27 NOTE — Telephone Encounter (Signed)
Pt called she wants to know the status of delivery of botox for this Wednesday? Did it come to the office? Please call to advise at [phone] x [phone]

## 2017-08-27 NOTE — Telephone Encounter (Signed)
I called patient but the number provided gave a busy signal both times I called.

## 2017-08-29 ENCOUNTER — Encounter: Payer: Self-pay | Admitting: Neurology

## 2017-08-29 ENCOUNTER — Ambulatory Visit (INDEPENDENT_AMBULATORY_CARE_PROVIDER_SITE_OTHER): Payer: 59 | Admitting: Neurology

## 2017-08-29 DIAGNOSIS — G5139 Clonic hemifacial spasm, unspecified: Secondary | ICD-10-CM | POA: Diagnosis not present

## 2017-08-29 MED ORDER — ONABOTULINUMTOXINA 100 UNITS IJ SOLR
100.0000 [IU] | Freq: Once | INTRAMUSCULAR | Status: AC
  Administered 2017-08-29: 100 [IU] via INTRAMUSCULAR

## 2017-08-29 NOTE — Progress Notes (Signed)
**  Botox 100 units x 1 vial, NDC 1478-2956-21, Lot [id number], Exp 12/2019, office supply.//mck,rn**

## 2017-08-29 NOTE — Progress Notes (Signed)
Chief Complaint  Patient presents with  . Facial Spasm    Botox 100 units x 1 vial - office supply   HISTORY:  Jaime Rodriguez is a 67 y.o.  right-handed black female referred by Dr. Jannifer Franklin for EMG guided botulinumtoxin injection for left facial spasm  She had a history of left-sided hemifacial spasm that began approximately a year ago in 2013. The patient was seen by Dr. Melton Alar, and she underwent MRI evaluation of the brain with and without gadolinium that was unremarkable. The patient underwent Botox injections in 2013,   She did well with the Botox, and the benefit lasted almost one year. She did has mild left facial droop after the injection.  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   She denies any balance issues, speech problems or swallowing problems. The patient denies any change in vision. She denies a previous history of left Bell's palsy.  In recent 1 year, since 2014, She began to have urinary urgency, she never had children in the past she denies bilateral upper and lower extremity paresthesia   UPDATE March 7th 2016:  Last injection was in Feb 2015, very helpful, did very well, she noticed recurrence of her left facial twitching, almost constant, she hoped to continue EMG guided xeomin injection for her left facial spasm,   UPDATE April 28th 2016: Her insurance refused xeomin, approved for Botox A 100 unit  UPDATE May 24 2015:She responded very well to previous Botox injection, came in for repeat injection again  Update September 14 2015: When I met her initially in 2014, she complains of urinary urgency, she has never had a vaginal delivery, her urinary urgency has been fairly stable, she denies gait difficulty, denies bowel and bladder incontinence, denied lower extremity paresthesia, she  wants to initiate evaluation, she does has hyperreflexia on examinations,.  Update May 16, 2017:  Last injection was in June 2017, she responded very well, now with frequent left hemifacial spasm again,  UPDATE Aug 29 2017: She responded very well to previous injection in Feb 2019, no significant side effect noticed    Past Medical History:  Diagnosis Date  . Allergy   . Diabetes mellitus   . Fibrocystic disease of breast    LEFT BREAST SURGERY  . Hemifacial spasm 12/18/2012   Left lower face  . Hyperlipidemia   . Postmenopausal     Past Surgical History:  Procedure Laterality Date  . BREAST LUMPECTOMY     Benign lesion    Family History  Problem Relation Age of Onset  . Cancer Mother   . Other Mother        sepsis  . Stroke Father   . Prostate cancer Brother   . Cancer Maternal Grandmother   . Asthma Sister   . Hyperlipidemia Sister   . Diabetes Sister   . Cancer Brother   . Hypertension Sister   . Anxiety disorder Other   . Diabetes Other   . Hyperlipidemia Other     Social history:  reports that she has never smoked. She has never used smokeless tobacco. She reports that she does not drink alcohol or use drugs.  Medications:  Current Outpatient Medications on File Prior to Visit  Medication Sig Dispense Refill  . aspirin 81 MG tablet Take 81 mg by mouth daily.    Marland Kitchen  botulinum toxin Type A (BOTOX) 100 units SOLR injection Inject 100 Units into the muscle every 3 (three) months.    . Calcium Carb-Cholecalciferol (CALCIUM 500+D3 PO) Take 1 mg by mouth 3 (three) times daily.    . carboxymethylcellulose (REFRESH PLUS) 0.5 % SOLN 1 drop 3 (three) times daily as needed.    . diclofenac sodium (VOLTAREN) 1 % GEL Apply topically as needed.   3  . LIVALO 4 MG TABS daily.   1  . loratadine (CLARITIN) 10 MG tablet Take 10 mg by mouth daily.    . meclizine (ANTIVERT) 25 MG tablet Take 25 mg by mouth 3 (three) times daily as needed for dizziness.     No current  facility-administered medications on file prior to visit.       Allergies  Allergen Reactions  . Penicillins     ROS:  Out of a complete 14 system review of symptoms, the patient complains only of the following symptoms, and all other reviewed systems are negative. Urinary urgency  Blood pressure 109/69, pulse 79, height 5' (1.524 m), weight 111 lb 4 oz (50.5 kg).  PHYSICAL EXAMNIATION:  NEUROLOGICAL EXAM:  MENTAL STATUS: Awake, alert.  CRANIAL NERVES:  CN II: Visual fields are full to confrontation.  . Venous pulsations are present bilaterally. Pupils are 4 mm and briskly reactive to light.  CN III, IV, VI: extraocular movement are normal. No ptosis. CN V: Facial sensation is intact to light touch bilaterally CN VII: She has frequent left hemifacial muscle spasm, involving left orbicularis oculi, orbicularis oris, mild left cheek puff weakness. She has mild  left eye closure weakness. CN VIII: Hearing is normal to rubbing fingers CN IX, X: Palate elevates symmetrically. Phonation is normal. CN XI: Head turning and shoulder shrug are intact CN XII: Tongue is midline with normal movements and no atrophy.  MOTOR: Move four limbs without difficulty   COORDINATION: There is no dysmetria noted  GAIT/STANCE: Posture is normal.     Assessment/Plan:  Left hemifacial spasm, responded very well to previous EMG guided botulism toxin injection, last injection was in June 2017.  EMG guided Botox a injection use 100 units, dissolvable to 2 cc of normal saline  Left orbicularis oculi, injection was placed at 3,4,5 6, 8, 10 oclock (2.5 x6= 15units ) Left frontalis  5 units Right frontalis  5units  Right corrugate 5 units Left corrugate 5 units Procerus 5 units  Left zygomatic major 5 units Left levator labii superior 5 units   Left mentalis 5 units Left platysmas 10 unitsx 3=30  Right orbicularis oculi at 4, 5, 6, 7, 8, ,9 (2.5x6=15)    Marcial Pacas, M.D.  Ph.D.  [facility] [place], [place] [address] Phone: [phone] Fax:      [phone]

## 2017-09-10 DIAGNOSIS — Z6821 Body mass index (BMI) 21.0-21.9, adult: Secondary | ICD-10-CM | POA: Diagnosis not present

## 2017-09-10 DIAGNOSIS — Z01419 Encounter for gynecological examination (general) (routine) without abnormal findings: Secondary | ICD-10-CM | POA: Diagnosis not present

## 2017-09-19 MED FILL — LIVALO 4 MG TABLET: 4 | 28 days supply | Qty: 20 | Fill #6

## 2017-10-02 MED FILL — AZITHROMYCIN 250 MG TABLET: 250 | 5 days supply | Qty: 6 | Fill #0

## 2017-10-19 DIAGNOSIS — Z0000 Encounter for general adult medical examination without abnormal findings: Secondary | ICD-10-CM | POA: Diagnosis not present

## 2017-10-19 DIAGNOSIS — Z23 Encounter for immunization: Secondary | ICD-10-CM | POA: Diagnosis not present

## 2017-10-19 DIAGNOSIS — E782 Mixed hyperlipidemia: Secondary | ICD-10-CM | POA: Diagnosis not present

## 2017-10-19 DIAGNOSIS — E559 Vitamin D deficiency, unspecified: Secondary | ICD-10-CM | POA: Diagnosis not present

## 2017-10-19 DIAGNOSIS — Z7982 Long term (current) use of aspirin: Secondary | ICD-10-CM | POA: Diagnosis not present

## 2017-10-19 MED FILL — DICLOFENAC SODIUM 1% GEL: 1 | 12 days supply | Qty: 100 | Fill #0

## 2017-10-19 MED FILL — LIVALO 4 MG TABLET: 4 | 84 days supply | Qty: 60 | Fill #0

## 2017-11-05 DIAGNOSIS — Z1231 Encounter for screening mammogram for malignant neoplasm of breast: Secondary | ICD-10-CM | POA: Diagnosis not present

## 2017-11-15 ENCOUNTER — Telehealth: Payer: Self-pay | Admitting: Radiology

## 2017-11-15 NOTE — Telephone Encounter (Signed)
Pt is wanting to know status of botox PA. Will she have to pay anything, please call to advise at [phone]

## 2017-11-19 NOTE — Telephone Encounter (Signed)
I called the patient back regarding her botox injections. She had questions about cost and coverage. She is approved and it looks like she has no balance for the last injection so I informed her of these things.

## 2017-11-30 MED FILL — PREVIDENT 5000 ENAMEL PROTE: 1.1-5 | 30 days supply | Qty: 100 | Fill #1

## 2017-12-05 ENCOUNTER — Ambulatory Visit (INDEPENDENT_AMBULATORY_CARE_PROVIDER_SITE_OTHER): Payer: 59 | Admitting: Neurology

## 2017-12-05 ENCOUNTER — Encounter: Payer: Self-pay | Admitting: Neurology

## 2017-12-05 VITALS — BP 131/75 | HR 77

## 2017-12-05 DIAGNOSIS — G5139 Clonic hemifacial spasm, unspecified: Secondary | ICD-10-CM

## 2017-12-05 MED ORDER — ONABOTULINUMTOXINA 100 UNITS IJ SOLR
100.0000 [IU] | Freq: Once | INTRAMUSCULAR | Status: AC
  Administered 2017-12-05: 100 [IU] via INTRAMUSCULAR

## 2017-12-05 NOTE — Progress Notes (Signed)
**  Botox 100 units x 1 vials, NDC 9604-5409-810023-1145-01, Lot [id number], Exp 05/2020, office supply.//mck,rn**

## 2017-12-05 NOTE — Progress Notes (Signed)
Chief Complaint  Patient presents with  . Botulinum Toxin Injection    EMG room 3, here alone. Feels botox has helped her spasms.    HISTORY:  Jaime Rodriguez is a 67 y.o.  right-handed black female referred by Dr. Jannifer Franklin for EMG guided botulinumtoxin injection for left facial spasm  She had a history of left-sided hemifacial spasm that began approximately a year ago in 2013. The patient was seen by Dr. Melton Alar, and she underwent MRI evaluation of the brain with and without gadolinium that was unremarkable. The patient underwent Botox injections in 2013,   She did well with the Botox, and the benefit lasted almost one year. She did has mild left facial droop after the injection.  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   She denies any balance issues, speech problems or swallowing problems. The patient denies any change in vision. She denies a previous history of left Bell's palsy.  In recent 1 year, since 2014, She began to have urinary urgency, she never had children in the past she denies bilateral upper and lower extremity paresthesia   UPDATE March 7th 2016:  Last injection was in Feb 2015, very helpful, did very well, she noticed recurrence of her left facial twitching, almost constant, she hoped to continue EMG guided xeomin injection for her left facial spasm,   UPDATE April 28th 2016: Her insurance refused xeomin, approved for Botox A 100 unit  UPDATE May 24 2015:She responded very well to previous Botox injection, came in for repeat injection again  Update September 14 2015: When I met her initially in 2014, she complains of urinary urgency, she has never had a vaginal delivery, her urinary urgency has been fairly stable, she denies gait difficulty, denies bowel and bladder incontinence, denied  lower extremity paresthesia, she wants to initiate evaluation, she does has hyperreflexia on examinations,.  Update May 16, 2017:  Last injection was in June 2017, she responded very well, now with frequent left hemifacial spasm again,  UPDATE Aug 29 2017: She responded very well to previous injection in Feb 2019, no significant side effect noticed  UPDATE December 05 2017: She did weill with previous injection on Aug 29 2017.  Past Medical History:  Diagnosis Date  . Allergy   . Diabetes mellitus   . Fibrocystic disease of breast    LEFT BREAST SURGERY  . Hemifacial spasm 12/18/2012   Left lower face  . Hyperlipidemia   . Postmenopausal     Past Surgical History:  Procedure Laterality Date  . BREAST LUMPECTOMY     Benign lesion    Family History  Problem Relation Age of Onset  . Cancer Mother   . Other Mother        sepsis  . Stroke Father   . Prostate cancer Brother   . Cancer Maternal Grandmother   . Asthma Sister   . Hyperlipidemia Sister   . Diabetes Sister   . Cancer Brother   . Hypertension Sister   . Anxiety disorder Other   . Diabetes Other   . Hyperlipidemia Other     Social history:  reports that she has never smoked. She has never used smokeless tobacco. She reports that she does not drink alcohol or use drugs.  Medications:  Current Outpatient Medications on File Prior to Visit  Medication Sig Dispense Refill  . aspirin 81 MG tablet Take 81 mg by mouth daily.    . botulinum toxin Type A (BOTOX) 100 units SOLR injection Inject 100 Units into the muscle every 3 (three) months.    . Calcium Carb-Cholecalciferol (CALCIUM 500+D3 PO) Take 1 mg by mouth 3 (three) times daily.    . carboxymethylcellulose (REFRESH PLUS) 0.5 % SOLN 1 drop 3 (three) times daily as needed.    . diclofenac sodium (VOLTAREN) 1 % GEL Apply topically as needed.   3  . LIVALO 4 MG TABS daily.   1  . loratadine (CLARITIN) 10 MG tablet Take 10 mg by mouth daily.    . meclizine  (ANTIVERT) 25 MG tablet Take 25 mg by mouth 3 (three) times daily as needed for dizziness.     No current facility-administered medications on file prior to visit.       Allergies  Allergen Reactions  . Penicillins     ROS:  Out of a complete 14 system review of symptoms, the patient complains only of the following symptoms, and all other reviewed systems are negative. Urinary urgency  Blood pressure 131/75, pulse 77, height 5' (1.524 m).  PHYSICAL EXAMNIATION:  NEUROLOGICAL EXAM:  MENTAL STATUS: Awake, alert.  CRANIAL NERVES:  CN II: Visual fields are full to confrontation.  . Venous pulsations are present bilaterally. Pupils are 4 mm and briskly reactive to light.  CN III, IV, VI: extraocular movement are normal. No ptosis. CN V: Facial sensation is intact to light touch bilaterally CN VII: She has frequent left hemifacial muscle spasm, involving left orbicularis oculi, orbicularis oris, mild left cheek puff weakness. She has mild  left eye closure weakness. CN VIII: Hearing is normal to rubbing fingers CN IX, X: Palate elevates symmetrically. Phonation is normal. CN XI: Head turning and shoulder shrug are intact CN XII: Tongue is midline with normal movements and no atrophy.  MOTOR: Move four limbs without difficulty   COORDINATION: There is no dysmetria noted  GAIT/STANCE: Posture is normal.     Assessment/Plan:  Left hemifacial spasm, responded very well to previous EMG guided botulism toxin injection, last injection was in June 2017.  EMG guided Botox a injection use 100 units, dissolvable to 2 cc of normal saline, used 67.5 units, discard 32.5 units  Left orbicularis oculi, injection was placed at 2,3,4,5 6, 8, 10 oclock (2.5 x 7=17.5 units ) Left frontalis  5 units Right frontalis  5units  Right corrugate 2.5 units Left corrugate 2.5 units Procerus 5 units  Left zygomatic major 5 units Left levator labii superior 5 units   Left mentalis 5  units Left platysmas 10 units  Right orbicularis oculi at 4, 5,  (2.5x2=5)    Marcial Pacas, M.D. Ph.D.  [facility] [place], [place] [address] Phone: [phone] Fax:      [phone]

## 2017-12-31 MED FILL — LIVALO 4 MG TABLET: 4 | 84 days supply | Qty: 60 | Fill #1

## 2018-03-08 MED FILL — LIVALO 4 MG TABLET: 4 | 84 days supply | Qty: 60 | Fill #2

## 2018-03-13 ENCOUNTER — Ambulatory Visit: Payer: 59 | Admitting: Neurology

## 2018-04-19 ENCOUNTER — Encounter: Payer: Self-pay | Admitting: Nurse Practitioner

## 2018-04-19 ENCOUNTER — Ambulatory Visit: Payer: 59 | Admitting: Nurse Practitioner

## 2018-04-19 VITALS — BP 112/70 | HR 88 | Temp 97.3°F | Ht 60.0 in | Wt 110.8 lb

## 2018-04-19 DIAGNOSIS — E782 Mixed hyperlipidemia: Secondary | ICD-10-CM

## 2018-04-19 DIAGNOSIS — R7303 Prediabetes: Secondary | ICD-10-CM | POA: Diagnosis not present

## 2018-04-19 MED ORDER — LIVALO 4 MG PO TABS: 4.0000 mg | ORAL_TABLET | Freq: Every day | ORAL | 1 refills | Status: DC

## 2018-04-19 MED ORDER — DICLOFENAC SODIUM 1 % TD GEL: 2.0000 g | TRANSDERMAL | 3 refills | Status: DC | PRN

## 2018-04-19 MED ORDER — DICLOFENAC SODIUM 1 % TD GEL: 2.0000 g | Freq: Four times a day (QID) | TRANSDERMAL | 3 refills | Status: AC | PRN

## 2018-04-19 MED FILL — DICLOFENAC SODIUM 1 % GEL: 1 | 12 days supply | Qty: 100 | Fill #1

## 2018-04-19 NOTE — Progress Notes (Signed)
Subjective:     Patient ID: Jaime Rodriguez , female    DOB: [birth year] , 68 y.o.   MRN: [id number]   Chief Complaint  Patient presents with  . Hyperlipidemia    HPI  Hyperlipidemia  This is a chronic problem. The current episode started more than 1 year ago. The problem is controlled. Recent lipid tests were reviewed and are high. She has no history of chronic renal disease or obesity. There are no known factors aggravating her hyperlipidemia. Current antihyperlipidemic treatment includes bile acid squestrants. The current treatment provides no improvement of lipids. There are no compliance problems.  Risk factors for coronary artery disease include dyslipidemia.     Past Medical History:  Diagnosis Date  . Allergy   . Diabetes mellitus   . Fibrocystic disease of breast    LEFT BREAST SURGERY  . Hemifacial spasm 12/18/2012   Left lower face  . Hyperlipidemia   . Postmenopausal      Family History  Problem Relation Age of Onset  . Cancer Mother   . Other Mother        sepsis  . Stroke Father   . Prostate cancer Brother   . Cancer Maternal Grandmother   . Asthma Sister   . Hyperlipidemia Sister   . Diabetes Sister   . Cancer Brother   . Hypertension Sister   . Anxiety disorder Other   . Diabetes Other   . Hyperlipidemia Other      Current Outpatient Medications:  .  aspirin 81 MG tablet, Take 81 mg by mouth daily., Disp: , Rfl:  .  botulinum toxin Type A (BOTOX) 100 units SOLR injection, Inject 100 Units into the muscle every 3 (three) months., Disp: , Rfl:  .  Calcium Carb-Cholecalciferol (CALCIUM 500+D3 PO), Take 1 mg by mouth 3 (three) times daily., Disp: , Rfl:  .  carboxymethylcellulose (REFRESH PLUS) 0.5 % SOLN, 1 drop 3 (three) times daily as needed., Disp: , Rfl:  .  diclofenac sodium (VOLTAREN) 1 % GEL, Apply topically as needed. , Disp: , Rfl: 3 .  LIVALO 4 MG TABS, daily. , Disp: , Rfl: 1 .  loratadine (CLARITIN) 10 MG tablet, Take 10 mg by mouth daily.,  Disp: , Rfl:  .  meclizine (ANTIVERT) 25 MG tablet, Take 25 mg by mouth 3 (three) times daily as needed for dizziness., Disp: , Rfl:    Allergies  Allergen Reactions  . Penicillins      Review of Systems  Constitutional: Negative for fatigue.  Respiratory: Negative for cough.   Cardiovascular: Negative.   Endocrine: Negative for polydipsia, polyphagia and polyuria.  Musculoskeletal: Negative.   Neurological: Negative for dizziness and headaches.     Today's Vitals   04/19/18 1552  BP: 112/70  Pulse: 88  Temp: (!) 97.3 F (36.3 C)  TempSrc: Oral  SpO2: 96%  Weight: 110 lb 12.8 oz (50.3 kg)  Height: 5' (1.524 m)  PainSc: 0-No pain   Body mass index is 21.64 kg/m.   Objective:  Physical Exam Vitals signs reviewed.  Constitutional:      Appearance: She is well-developed.  HENT:     Head: Normocephalic and atraumatic.  Eyes:     Pupils: Pupils are equal, round, and reactive to light.  Cardiovascular:     Rate and Rhythm: Normal rate and regular rhythm.     Pulses: Normal pulses.     Heart sounds: Normal heart sounds. No murmur.  Pulmonary:     Effort:  Pulmonary effort is normal.     Breath sounds: Normal breath sounds.  Musculoskeletal:        General: Tenderness: cervical and low back.  Skin:    General: Skin is warm and dry.     Capillary Refill: Capillary refill takes less than 2 seconds.  Neurological:     General: No focal deficit present.     Mental Status: She is alert and oriented to person, place, and time.     Cranial Nerves: No cranial nerve deficit.  Psychiatric:        Mood and Affect: Mood normal.         Assessment And Plan:     1. Mixed hyperlipidemia  Chronic, controlled  Continue with current medications - LIVALO 4 MG TABS; Take 1 tablet (4 mg total) by mouth daily.  Dispense: 90 tablet; Refill: 1 - CMP14 + Anion Gap  2. Prediabetes  Chronic, controlled  No current medications  Encouraged to limit intake of sugary foods and  drinks  Encouraged to increase physical activity to 150 minutes per week - Lipid Profile - Hemoglobin A1c       [name], FNP

## 2018-04-20 LAB — LIPID PANEL
Chol/HDL Ratio: 2.6 ratio (ref 0.0–4.4)
Cholesterol, Total: 209 mg/dL — ABNORMAL HIGH (ref 100–199)
HDL: 79 mg/dL (ref >39)
LDL Calculated: 118 mg/dL — ABNORMAL HIGH (ref 0–99)
Triglycerides: 58 mg/dL (ref 0–149)
VLDL Cholesterol Cal: 12 mg/dL (ref 5–40)

## 2018-04-20 LAB — CMP14 + ANION GAP
ALT: 12 IU/L (ref 0–32)
ANION GAP: 15 mmol/L (ref 10.0–18.0)
AST: 24 IU/L (ref 0–40)
Albumin/Globulin Ratio: 1.6 (ref 1.2–2.2)
Albumin: 4.4 g/dL (ref 3.6–4.8)
Alkaline Phosphatase: 93 IU/L (ref 39–117)
BUN/Creatinine Ratio: 14 (ref 12–28)
BUN: 13 mg/dL (ref 8–27)
Bilirubin Total: 0.2 mg/dL (ref 0.0–1.2)
CO2: 24 mmol/L (ref 20–29)
CREATININE: 0.95 mg/dL (ref 0.57–1.00)
Calcium: 9.6 mg/dL (ref 8.7–10.3)
Chloride: 102 mmol/L (ref 96–106)
GFR calc Af Amer: 72 mL/min/{1.73_m2} (ref >59)
GFR, EST NON AFRICAN AMERICAN: 62 mL/min/{1.73_m2} (ref >59)
Globulin, Total: 2.8 g/dL (ref 1.5–4.5)
Glucose: 113 mg/dL — ABNORMAL HIGH (ref 65–99)
Potassium: 4.9 mmol/L (ref 3.5–5.2)
Sodium: 141 mmol/L (ref 134–144)
Total Protein: 7.2 g/dL (ref 6.0–8.5)

## 2018-04-20 LAB — HEMOGLOBIN A1C
Est. average glucose Bld gHb Est-mCnc: 128 mg/dL
Hgb A1c MFr Bld: 6.1 % — ABNORMAL HIGH (ref 4.8–5.6)

## 2018-05-07 ENCOUNTER — Encounter: Payer: Self-pay | Admitting: Nurse Practitioner

## 2018-05-20 ENCOUNTER — Other Ambulatory Visit: Payer: Self-pay | Admitting: Nurse Practitioner

## 2018-05-20 DIAGNOSIS — E782 Mixed hyperlipidemia: Secondary | ICD-10-CM

## 2018-05-20 MED FILL — LIVALO 4 MG TABLET: 4 | 84 days supply | Qty: 60 | Fill #0 | Status: TO

## 2018-05-23 MED FILL — PREVIDENT 5000 ENAMEL PROTE: 1.1-5 | 30 days supply | Qty: 100 | Fill #2

## 2018-06-24 ENCOUNTER — Other Ambulatory Visit: Payer: Self-pay

## 2018-06-24 ENCOUNTER — Encounter: Payer: Self-pay | Admitting: Nurse Practitioner

## 2018-06-24 ENCOUNTER — Ambulatory Visit: Payer: 59 | Admitting: Nurse Practitioner

## 2018-06-24 VITALS — BP 110/66 | HR 87 | Temp 98.0°F | Ht 60.8 in | Wt 111.6 lb

## 2018-06-24 DIAGNOSIS — R42 Dizziness and giddiness: Secondary | ICD-10-CM | POA: Diagnosis not present

## 2018-06-24 MED ORDER — PROMETHAZINE HCL 12.5 MG PO TABS: 12.5000 mg | ORAL_TABLET | Freq: Four times a day (QID) | ORAL | 0 refills | Status: DC | PRN

## 2018-06-24 MED ORDER — MECLIZINE HCL 25 MG PO TABS: 25.0000 mg | ORAL_TABLET | Freq: Three times a day (TID) | ORAL | 3 refills | Status: DC | PRN

## 2018-06-24 MED FILL — PROMETHAZINE 12.5 MG TABLET: 12.5 | 8 days supply | Qty: 30 | Fill #0

## 2018-06-24 MED FILL — MECLIZINE 25 MG TABLET: 25 | 10 days supply | Qty: 30 | Fill #0

## 2018-06-24 NOTE — Progress Notes (Signed)
Subjective:     Patient ID: Jaime Rodriguez , female    DOB: [birth year] , 68 y.o.   MRN: [id number]   Chief Complaint  Patient presents with  . Dizziness    saturday-antivert didnt work-no appetite-    HPI  She took whole antivert on Sunday, 8pm last night.  Awakened Saturday morning with symptoms.  Balance off, room has been spinning.  Nauseated but no vomiting.  Able to eat a little bit just doesn't want to eat.    Problems with her sinuses - loratadine every other day.    Dizziness  This is a recurrent problem. The current episode started in the past 7 days. Associated symptoms include fatigue and numbness. Pertinent negatives include no abdominal pain, chest pain, coughing or headaches.     Past Medical History:  Diagnosis Date  . Allergy   . Diabetes mellitus   . Fibrocystic disease of breast    LEFT BREAST SURGERY  . Hemifacial spasm 12/18/2012   Left lower face  . Hyperlipidemia   . Postmenopausal      Family History  Problem Relation Age of Onset  . Cancer Mother   . Other Mother        sepsis  . Stroke Father   . Prostate cancer Brother   . Cancer Maternal Grandmother   . Asthma Sister   . Hyperlipidemia Sister   . Diabetes Sister   . Cancer Brother   . Hypertension Sister   . Anxiety disorder Other   . Diabetes Other   . Hyperlipidemia Other      Current Outpatient Medications:  .  aspirin 81 MG tablet, Take 81 mg by mouth daily., Disp: , Rfl:  .  botulinum toxin Type A (BOTOX) 100 units SOLR injection, Inject 100 Units into the muscle every 3 (three) months., Disp: , Rfl:  .  Calcium Carb-Cholecalciferol (CALCIUM 500+D3 PO), Take 1 mg by mouth 3 (three) times daily., Disp: , Rfl:  .  carboxymethylcellulose (REFRESH PLUS) 0.5 % SOLN, 1 drop 3 (three) times daily as needed., Disp: , Rfl:  .  diclofenac sodium (VOLTAREN) 1 % GEL, Apply 2 g topically 4 (four) times daily as needed., Disp: 1 Tube, Rfl: 3 .  LIVALO 4 MG TABS, TAKE 1 TABLET BY MOUTH ONCE  DAILY ON MONDAY-FRIDAYS AND SKIP SATURDAY & SUNDAY, Disp: 90 tablet, Rfl: 1 .  loratadine (CLARITIN) 10 MG tablet, Take 10 mg by mouth daily., Disp: , Rfl:  .  meclizine (ANTIVERT) 25 MG tablet, Take 25 mg by mouth 3 (three) times daily as needed for dizziness., Disp: , Rfl:    Allergies  Allergen Reactions  . Penicillins      Review of Systems  Constitutional: Positive for fatigue.  Eyes: Negative for photophobia.  Respiratory: Negative for cough.   Cardiovascular: Negative.  Negative for chest pain, palpitations and leg swelling.  Gastrointestinal: Negative for abdominal pain.  Neurological: Positive for dizziness, light-headedness and numbness. Negative for headaches.       History of vertigo.       Today's Vitals   06/24/18 1505  BP: 110/66  Pulse: 87  Temp: 98 F (36.7 C)  TempSrc: Oral  SpO2: 97%  Weight: 111 lb 9.6 oz (50.6 kg)  Height: 5' 0.8" (1.544 m)   Body mass index is 21.23 kg/m.   Objective:  Physical Exam Constitutional:      Appearance: Normal appearance.     Comments: Appears uncomfortable and needing assistance with walking  HENT:     Right Ear: Tympanic membrane is bulging.     Left Ear: Tympanic membrane is bulging.  Cardiovascular:     Rate and Rhythm: Normal rate and regular rhythm.     Pulses: Normal pulses.     Heart sounds: Normal heart sounds. No murmur.  Pulmonary:     Effort: Pulmonary effort is normal. No respiratory distress.     Breath sounds: Normal breath sounds. No wheezing.  Skin:    General: Skin is warm.     Capillary Refill: Capillary refill takes less than 2 seconds.  Neurological:     General: No focal deficit present.     Mental Status: She is alert.         Assessment And Plan:     1. Vertigo  Tm's are bulging will also given samples of ryvent  Promethazine 25mg  IM given in office dgt here to drive her home  Advised this can take several days to improve - promethazine (PHENERGAN) 12.5 MG tablet; Take 1  tablet (12.5 mg total) by mouth every 6 (six) hours as needed for nausea or vomiting.  Dispense: 30 tablet; Refill: 0 - meclizine (ANTIVERT) 25 MG tablet; Take 1 tablet (25 mg total) by mouth 3 (three) times daily as needed for dizziness.  Dispense: 30 tablet; Refill: 3 - promethazine (PHENERGAN) injection 25 mg .      [name], FNP

## 2018-06-25 DIAGNOSIS — R42 Dizziness and giddiness: Secondary | ICD-10-CM | POA: Diagnosis not present

## 2018-06-25 MED ORDER — PROMETHAZINE HCL 25 MG/ML IJ SOLN
25.0000 mg | Freq: Once | INTRAMUSCULAR | Status: AC
  Administered 2018-06-25: 12.5 mg via INTRAMUSCULAR

## 2018-06-26 ENCOUNTER — Encounter: Payer: Self-pay | Admitting: Nurse Practitioner

## 2018-06-27 ENCOUNTER — Other Ambulatory Visit: Payer: Self-pay

## 2018-06-27 MED ORDER — CARBINOXAMINE MALEATE 6 MG PO TABS: 1.0000 | ORAL_TABLET | Freq: Every day | ORAL | 4 refills | Status: DC

## 2018-07-01 ENCOUNTER — Telehealth: Payer: Self-pay | Admitting: Neurology

## 2018-07-01 ENCOUNTER — Other Ambulatory Visit: Payer: Self-pay | Admitting: Nurse Practitioner

## 2018-07-10 ENCOUNTER — Encounter: Payer: Self-pay | Admitting: Nurse Practitioner

## 2018-07-12 ENCOUNTER — Encounter: Payer: Self-pay | Admitting: Nurse Practitioner

## 2018-07-25 MED FILL — LIVALO 4 MG TABLET: 4 | 84 days supply | Qty: 60 | Fill #0

## 2018-07-30 ENCOUNTER — Encounter: Payer: Self-pay | Admitting: Nurse Practitioner

## 2018-07-31 ENCOUNTER — Other Ambulatory Visit: Payer: Self-pay

## 2018-07-31 MED ORDER — CARBINOXAMINE MALEATE 6 MG PO TABS: 1.0000 | ORAL_TABLET | Freq: Every day | ORAL | 1 refills | Status: DC

## 2018-08-14 ENCOUNTER — Ambulatory Visit: Payer: 59 | Admitting: Neurology

## 2018-08-21 ENCOUNTER — Ambulatory Visit: Payer: Self-pay | Admitting: Neurology

## 2018-08-21 ENCOUNTER — Ambulatory Visit (INDEPENDENT_AMBULATORY_CARE_PROVIDER_SITE_OTHER): Payer: 59 | Admitting: Neurology

## 2018-08-21 ENCOUNTER — Encounter: Payer: Self-pay | Admitting: Neurology

## 2018-08-21 ENCOUNTER — Telehealth: Payer: Self-pay | Admitting: *Deleted

## 2018-08-21 ENCOUNTER — Other Ambulatory Visit: Payer: Self-pay

## 2018-08-21 VITALS — BP 128/75 | HR 83 | Temp 98.7°F | Ht 61.0 in | Wt 115.5 lb

## 2018-08-21 DIAGNOSIS — G5139 Clonic hemifacial spasm, unspecified: Secondary | ICD-10-CM

## 2018-08-21 NOTE — Progress Notes (Signed)
**  Botox 100 units x 1 vials, NDC 5427-0623-76, Lot [id number], Exp 12/2020, office supply.//mck,rn**

## 2018-08-21 NOTE — Telephone Encounter (Signed)
Called pt. Reminded her about appt today for botox. Asked her to check in at 3pm. Advised her of new check in process. She verbalized understanding and appreciation for call.

## 2018-08-23 MED ORDER — ONABOTULINUMTOXINA 100 UNITS IJ SOLR: 100.0000 [IU] | Freq: Once | INTRAMUSCULAR | Status: DC

## 2018-08-23 NOTE — Progress Notes (Signed)
PATIENT: Jaime Rodriguez DOB: 1950-04-17  Chief Complaint  Patient presents with  . Facial Nerve Spasm    Xeomin 100 units x 1 vial - office supply     HISTORICAL   EDUARDO WURTH is a 68 y.o.  right-handed black female referred by Dr. Jannifer Franklin for EMG guided botulinumtoxin injection for left facial spasm  She had a history of left-sided hemifacial spasm that began approximately a year ago in 2013. The patient was seen by Dr. Melton Alar, and she underwent MRI evaluation of the brain with and without gadolinium that was unremarkable. The patient underwent Botox injections in 2013,   She did well with the Botox, and the benefit lasted almost one year. She did has mild left facial droop after the injection.  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   She denies any balance issues, speech problems or swallowing problems. The patient denies any change in vision. She denies a previous history of left Bell's palsy.  In recent 1 year, since 2014, She began to have urinary urgency, she never had children in the past she denies bilateral upper and lower extremity paresthesia   UPDATE March 7th 2016:  Last injection was in Feb 2015, very helpful, did very well, she noticed recurrence of her left facial twitching, almost constant, she hoped to continue EMG guided xeomin injection for her left facial spasm,   UPDATE April 28th 2016: Her insurance refused xeomin, approved for Botox A 100 unit  UPDATE May 24 2015:She responded very well to previous Botox injection, came in for repeat injection again  Update September 14 2015: When I met her initially in 2014, she complains of urinary urgency, she has never had a vaginal delivery, her urinary urgency has been fairly stable, she denies gait difficulty, denies  bowel and bladder incontinence, denied lower extremity paresthesia, she wants to initiate evaluation, she does has hyperreflexia on examinations,.  Update May 16, 2017:  Last injection was in June 2017, she responded very well, now with frequent left hemifacial spasm again,  UPDATE Aug 29 2017: She responded very well to previous injection in Feb 2019, no significant side effect noticed  UPDATE December 05 2017: She did weill with previous injection on Aug 29 2017.  UPDATE Aug 21 2018: She responded very well to previous injection, there was no significant side effect noted.  On exam: She has frequent left hemifacial muscle spasm, involving left orbicularis oculi, orbicularis oris, mild left cheek puff weakness. She has mild  left eye closure weakness.    Assessment/Plan:  Left hemifacial spasm, responded very well to previous EMG guided botulism toxin injection, last injection was in June 2017.  EMG guided Botox a injection use 100 units, dissolvable to 2 cc of normal saline, used 62.5 units, discard 37.5 units  Left orbicularis oculi, injection was placed at 2,3,4,5 6, 8, 10 oclock (2.5 x 7=17.5 units ) Left frontalis  5 units Right frontalis  5units  Right corrugate 2.5 units Left corrugate 2.5 units Procerus 5 units  Left zygomatic major 5 units   Left mentalis 5 units Left platysmas 10 units  Right orbicularis oculi at 4, 5,  (2.5x2=5)    Marcial Pacas, M.D. Ph.D.  [facility] [place], [place] [address] Phone: [phone] Fax:  [phone]   

## 2018-10-11 MED FILL — LIVALO 4 MG TABLET: 4 | 84 days supply | Qty: 60 | Fill #1

## 2018-10-23 NOTE — Telephone Encounter (Signed)
Error

## 2018-10-25 ENCOUNTER — Encounter: Payer: 59 | Admitting: Nurse Practitioner

## 2018-11-05 ENCOUNTER — Encounter: Payer: 59 | Admitting: Nurse Practitioner

## 2018-11-26 ENCOUNTER — Telehealth: Payer: Self-pay | Admitting: Neurology

## 2018-11-26 NOTE — Telephone Encounter (Signed)
I called the patients insurance to check injections codes for her Botox injections. I spoke with Malachy Mood who stated NPR was required. SHN#[id number]. DW

## 2018-11-28 ENCOUNTER — Ambulatory Visit (INDEPENDENT_AMBULATORY_CARE_PROVIDER_SITE_OTHER): Payer: 59 | Admitting: Neurology

## 2018-11-28 ENCOUNTER — Encounter: Payer: Self-pay | Admitting: Neurology

## 2018-11-28 ENCOUNTER — Other Ambulatory Visit: Payer: Self-pay

## 2018-11-28 VITALS — BP 133/76 | HR 87 | Temp 98.2°F | Ht 61.0 in | Wt 114.0 lb

## 2018-11-28 DIAGNOSIS — G5133 Clonic hemifacial spasm, bilateral: Secondary | ICD-10-CM | POA: Diagnosis not present

## 2018-11-28 DIAGNOSIS — G5139 Clonic hemifacial spasm, unspecified: Secondary | ICD-10-CM

## 2018-11-28 MED ORDER — ONABOTULINUMTOXINA 100 UNITS IJ SOLR
100.0000 [IU] | Freq: Once | INTRAMUSCULAR | Status: AC
  Administered 2018-11-28: 100 [IU] via INTRAMUSCULAR

## 2018-11-28 NOTE — Progress Notes (Signed)
PATIENT: Jaime Rodriguez DOB: 1950/04/12  Chief Complaint  Patient presents with  . Hemifacial Spasm    Botox 100 units x 1 vial - office supply     HISTORICAL   Jaime Rodriguez is a 68 y.o.  right-handed black female referred by Dr. Jannifer Rodriguez for EMG guided botulinumtoxin injection for left facial spasm  She had a history of left-sided hemifacial spasm that began approximately a year ago in 2013. The patient was seen by Dr. Melton Rodriguez, and she underwent MRI evaluation of the brain with and without gadolinium that was unremarkable. The patient underwent Botox injections in 2013,   She did well with the Botox, and the benefit lasted almost one year. She did has mild left facial droop after the injection.  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   She denies any balance issues, speech problems or swallowing problems. The patient denies any change in vision. She denies a previous history of left Bell's palsy.  In recent 1 year, since 2014, She began to have urinary urgency, she never had children in the past she denies bilateral upper and lower extremity paresthesia   UPDATE March 7th 2016:  Last injection was in Feb 2015, very helpful, did very well, she noticed recurrence of her left facial twitching, almost constant, she hoped to continue EMG guided xeomin injection for her left facial spasm,   UPDATE April 28th 2016: Her insurance refused xeomin, approved for Botox A 100 unit  UPDATE May 24 2015:She responded very well to previous Botox injection, came in for repeat injection again  Update September 14 2015: When I met her initially in 2014, she complains of urinary urgency, she has never had a vaginal delivery, her urinary urgency has been fairly stable, she denies gait difficulty, denies bowel  and bladder incontinence, denied lower extremity paresthesia, she wants to initiate evaluation, she does has hyperreflexia on examinations,.  Update May 16, 2017:  Last injection was in June 2017, she responded very well, now with frequent left hemifacial spasm again,  UPDATE Aug 29 2017: She responded very well to previous injection in Feb 2019, no significant side effect noticed  UPDATE December 05 2017: She did weill with previous injection on Aug 29 2017.  UPDATE Aug 21 2018: She responded very well to previous injection, there was no significant side effect noted.  UPDATE November 28 2018: She did well to previous injection  On exam: She has frequent left hemifacial muscle spasm, involving left orbicularis oculi, orbicularis oris, mild left cheek puff weakness. She has mild  left eye closure weakness.    Assessment/Plan:  Left hemifacial spasm, responded very well to previous EMG guided botulism toxin injection, last injection was in June 2017.  EMG guided Botox a injection use 100 units, dissolvable to 2 cc of normal saline, used 55 units, discard 45 units  Left orbicularis oculi, injection was placed at 2,3,4,5 6, 8,   oclock (2.5 x 6=15  units ) Left frontalis  5 units Right frontalis  5units  Right corrugate 2.5 units Left corrugate 2.5 units Procerus 5 units  Left zygomatic major 5 units   Left platysmas 10 units  Right orbicularis oculi at 4, 5,  (2.5x2=5)    Jaime Rodriguez, M.D. Ph.D.  [facility] [address] [place], [place] [address]  Phone: [phone] Fax:      [phone]

## 2018-11-28 NOTE — Progress Notes (Signed)
**  Botox 100 units x 1 vials, NDC 0023-1145-01, Lot [id number], Exp 03/2021, office supply.//mck,rn** 

## 2018-12-03 ENCOUNTER — Other Ambulatory Visit: Payer: Self-pay

## 2018-12-03 ENCOUNTER — Encounter: Payer: Self-pay | Admitting: Nurse Practitioner

## 2018-12-03 ENCOUNTER — Ambulatory Visit: Payer: 59 | Admitting: Nurse Practitioner

## 2018-12-03 VITALS — BP 120/84 | HR 102 | Temp 98.7°F | Ht 60.6 in | Wt 116.0 lb

## 2018-12-03 DIAGNOSIS — E782 Mixed hyperlipidemia: Secondary | ICD-10-CM | POA: Diagnosis not present

## 2018-12-03 DIAGNOSIS — R7303 Prediabetes: Secondary | ICD-10-CM

## 2018-12-03 DIAGNOSIS — Z0000 Encounter for general adult medical examination without abnormal findings: Secondary | ICD-10-CM | POA: Diagnosis not present

## 2018-12-03 DIAGNOSIS — Z23 Encounter for immunization: Secondary | ICD-10-CM | POA: Diagnosis not present

## 2018-12-03 LAB — POCT URINALYSIS DIPSTICK
Bilirubin, UA: NEGATIVE
Glucose, UA: NEGATIVE
Ketones, UA: NEGATIVE
Nitrite, UA: NEGATIVE
Protein, UA: NEGATIVE
Spec Grav, UA: 1.02 (ref 1.010–1.025)
Urobilinogen, UA: 0.2 E.U./dL
pH, UA: 5.5 (ref 5.0–8.0)

## 2018-12-03 LAB — POCT UA - MICROALBUMIN
Albumin/Creatinine Ratio, Urine, POC: 30
Creatinine, POC: 50 mg/dL
Microalbumin Ur, POC: 10 mg/L

## 2018-12-03 MED ORDER — PNEUMOCOCCAL 13-VAL CONJ VACC IM SUSP: 0.5000 mL | INTRAMUSCULAR | 0 refills | Status: AC

## 2018-12-03 MED ORDER — LIVALO 4 MG PO TABS: 1.0000 | ORAL_TABLET | Freq: Every day | ORAL | 1 refills | Status: DC

## 2018-12-03 MED ORDER — METFORMIN HCL 500 MG PO TABS: 500.0000 mg | ORAL_TABLET | Freq: Every day | ORAL | 2 refills | Status: DC

## 2018-12-03 MED FILL — metFORMIN HCL 500 MG TABS: 500 | 30 days supply | Qty: 30 | Fill #0

## 2018-12-03 NOTE — Progress Notes (Signed)
Subjective:     Patient ID: Jaime Rodriguez , female    DOB: 02-19-1951 , 68 y.o.   MRN: [id number]   Chief Complaint  Patient presents with  . Annual Exam   The patient states she uses status post hysterectomy for birth control. Last LMP - postmenopausal. Mammogram last done 2019 (will have done next week).  Negative for: breast discharge, breast lump(s), breast pain and breast self exam.  Pertinent negatives include abnormal bleeding (hematology), anxiety, decreased libido, depression, difficulty falling sleep, dyspareunia, history of infertility, nocturia, sexual dysfunction, sleep disturbances, urinary incontinence, urinary urgency, vaginal discharge and vaginal itching. Diet regular. The patient states her exercise level is  walking 30 minutes average 2-3 days per week.      The patient's tobacco use is:  Social History   Tobacco Use  Smoking Status Never Smoker  Smokeless Tobacco Never Used   She has been exposed to passive smoke. The patient's alcohol use is:  Social History   Substance and Sexual Activity  Alcohol Use No   Additional information: Last pap 2019 Henreitta Leber([facility]), next one scheduled for 2020.   HPI  Here for HM      Past Medical History:  Diagnosis Date  . Allergy   . Diabetes mellitus   . Fibrocystic disease of breast    LEFT BREAST SURGERY  . Hemifacial spasm 12/18/2012   Left lower face  . Hyperlipidemia   . Postmenopausal      Family History  Problem Relation Age of Onset  . Cancer Mother   . Other Mother        sepsis  . Stroke Father   . Prostate cancer Brother   . Cancer Maternal Grandmother   . Asthma Sister   . Hyperlipidemia Sister   . Diabetes Sister   . Cancer Brother   . Hypertension Sister   . Anxiety disorder Other   . Diabetes Other   . Hyperlipidemia Other      Current Outpatient Medications:  .  aspirin 81 MG tablet, Take 81 mg by mouth daily., Disp: , Rfl:  .  botulinum toxin Type A (BOTOX) 100 units SOLR  injection, Inject 100 Units into the muscle every 3 (three) months., Disp: , Rfl:  .  Calcium Carb-Cholecalciferol (CALCIUM 500+D3 PO), Take 1 mg by mouth 3 (three) times daily., Disp: , Rfl:  .  carboxymethylcellulose (REFRESH PLUS) 0.5 % SOLN, 1 drop 3 (three) times daily as needed., Disp: , Rfl:  .  diclofenac sodium (VOLTAREN) 1 % GEL, Apply 2 g topically 4 (four) times daily as needed., Disp: 1 Tube, Rfl: 3 .  LIVALO 4 MG TABS, TAKE 1 TABLET BY MOUTH ONCE DAILY ON MONDAY-FRIDAYS AND SKIP SATURDAY & SUNDAY, Disp: 90 tablet, Rfl: 1 .  loratadine (CLARITIN) 10 MG tablet, Take 10 mg by mouth daily., Disp: , Rfl:  .  Carbinoxamine Maleate (RYVENT) 6 MG TABS, Take 1 tablet by mouth daily. (Patient not taking: Reported on 12/03/2018), Disp: 60 tablet, Rfl: 1 .  meclizine (ANTIVERT) 25 MG tablet, Take 1 tablet (25 mg total) by mouth 3 (three) times daily as needed for dizziness. (Patient not taking: Reported on 12/03/2018), Disp: 30 tablet, Rfl: 3  Current Facility-Administered Medications:  .  botulinum toxin Type A (BOTOX) injection 100 Units, 100 Units, Intramuscular, Once, Levert FeinsteinYan, Yijun, MD   Allergies  Allergen Reactions  . Penicillins      Review of Systems  Constitutional: Negative.  Negative for chills.  HENT: Negative.  Eyes: Negative.   Respiratory: Negative.   Cardiovascular: Negative.   Gastrointestinal: Negative.   Endocrine: Negative.  Negative for polydipsia, polyphagia and polyuria.  Genitourinary: Negative.   Musculoskeletal: Negative.   Skin: Negative.   Allergic/Immunologic: Negative.   Neurological: Negative.   Hematological: Negative.   Psychiatric/Behavioral: Negative.      Today's Vitals   12/03/18 1421  BP: 120/84  Pulse: (!) 102  Temp: 98.7 F (37.1 C)  TempSrc: Oral  Weight: 116 lb (52.6 kg)  Height: 5' 0.6" (1.539 m)  PainSc: 0-No pain   Body mass index is 22.21 kg/m.   Objective:  Physical Exam Constitutional:      Appearance: Normal appearance.  She is well-developed.  HENT:     Head: Normocephalic and atraumatic.     Right Ear: Hearing, tympanic membrane, ear canal and external ear normal.     Left Ear: Hearing, tympanic membrane, ear canal and external ear normal.     Nose: Nose normal.     Mouth/Throat:     Mouth: Mucous membranes are moist.  Eyes:     General: Lids are normal.     Conjunctiva/sclera: Conjunctivae normal.     Pupils: Pupils are equal, round, and reactive to light.     Funduscopic exam:    Right eye: No papilledema.        Left eye: No papilledema.  Neck:     Musculoskeletal: Full passive range of motion without pain, normal range of motion and neck supple.     Thyroid: No thyroid mass.     Vascular: No carotid bruit.  Cardiovascular:     Rate and Rhythm: Normal rate and regular rhythm.     Pulses: Normal pulses.     Heart sounds: Normal heart sounds. No murmur.  Pulmonary:     Effort: Pulmonary effort is normal.     Breath sounds: Normal breath sounds.  Abdominal:     General: Abdomen is flat. Bowel sounds are normal.     Palpations: Abdomen is soft.  Musculoskeletal: Normal range of motion.        General: No swelling.     Right lower leg: No edema.     Left lower leg: No edema.  Skin:    General: Skin is warm and dry.     Capillary Refill: Capillary refill takes less than 2 seconds.  Neurological:     General: No focal deficit present.     Mental Status: She is alert and oriented to person, place, and time.     Cranial Nerves: No cranial nerve deficit.     Sensory: No sensory deficit.  Psychiatric:        Mood and Affect: Mood normal.        Behavior: Behavior normal.        Thought Content: Thought content normal.        Judgment: Judgment normal.         Assessment And Plan:     1. Health maintenance examination Behavior modifications discussed and diet history reviewed.   Pt will continue to exercise regularly and modify diet with low GI, plant based foods and decrease intake of  processed foods.  Recommend intake of daily multivitamin, Vitamin D, and calcium.  Recommend mammogram and colonoscopy for preventive screenings, as well as recommend immunizations that include influenza, TDAP  2. Prediabetes  Chronic  Will check HgbA1c  Avoid sugary foods and drinks - POCT Urinalysis Dipstick (81002) - POCT UA - Microalbumin -  Hemoglobin A1c  3. Mixed hyperlipidemia  Chronic, stable  Continue with current medications  No current issues with muscle cramping - CMP14 + Anion Gap - Lipid panel   Jaime FeltsJanece Tanaysia Bhardwaj, FNP    THE PATIENT IS ENCOURAGED TO PRACTICE SOCIAL DISTANCING DUE TO THE COVID-19 PANDEMIC.

## 2018-12-04 ENCOUNTER — Encounter: Payer: Self-pay | Admitting: Nurse Practitioner

## 2018-12-04 LAB — CMP14 + ANION GAP
ALT: 11 IU/L (ref 0–32)
AST: 22 IU/L (ref 0–40)
Albumin/Globulin Ratio: 1.8 (ref 1.2–2.2)
Albumin: 4.6 g/dL (ref 3.8–4.8)
Alkaline Phosphatase: 92 IU/L (ref 39–117)
Anion Gap: 15 mmol/L (ref 10.0–18.0)
BUN/Creatinine Ratio: 18 (ref 12–28)
BUN: 20 mg/dL (ref 8–27)
Bilirubin Total: <0.2 mg/dL (ref 0.0–1.2)
CO2: 23 mmol/L (ref 20–29)
Calcium: 9.4 mg/dL (ref 8.7–10.3)
Chloride: 101 mmol/L (ref 96–106)
Creatinine, Ser: 1.11 mg/dL — ABNORMAL HIGH (ref 0.57–1.00)
GFR calc Af Amer: 59 mL/min/{1.73_m2} — ABNORMAL LOW (ref >59)
GFR calc non Af Amer: 51 mL/min/{1.73_m2} — ABNORMAL LOW (ref >59)
Globulin, Total: 2.6 g/dL (ref 1.5–4.5)
Glucose: 105 mg/dL — ABNORMAL HIGH (ref 65–99)
Potassium: 4.7 mmol/L (ref 3.5–5.2)
Sodium: 139 mmol/L (ref 134–144)
Total Protein: 7.2 g/dL (ref 6.0–8.5)

## 2018-12-04 LAB — HEMOGLOBIN A1C
Est. average glucose Bld gHb Est-mCnc: 134 mg/dL
Hgb A1c MFr Bld: 6.3 % — ABNORMAL HIGH (ref 4.8–5.6)

## 2018-12-04 LAB — LIPID PANEL
Chol/HDL Ratio: 3.1 ratio (ref 0.0–4.4)
Cholesterol, Total: 219 mg/dL — ABNORMAL HIGH (ref 100–199)
HDL: 70 mg/dL (ref >39)
LDL Calculated: 134 mg/dL — ABNORMAL HIGH (ref 0–99)
Triglycerides: 73 mg/dL (ref 0–149)
VLDL Cholesterol Cal: 15 mg/dL (ref 5–40)

## 2018-12-09 DIAGNOSIS — Z01419 Encounter for gynecological examination (general) (routine) without abnormal findings: Secondary | ICD-10-CM | POA: Diagnosis not present

## 2018-12-09 DIAGNOSIS — M810 Age-related osteoporosis without current pathological fracture: Secondary | ICD-10-CM | POA: Diagnosis not present

## 2018-12-09 DIAGNOSIS — Z1231 Encounter for screening mammogram for malignant neoplasm of breast: Secondary | ICD-10-CM | POA: Diagnosis not present

## 2018-12-09 DIAGNOSIS — Z6821 Body mass index (BMI) 21.0-21.9, adult: Secondary | ICD-10-CM | POA: Diagnosis not present

## 2018-12-16 IMAGING — DX DG OS CALCIS 2+V*R*
2 series · 2 of 2 positions shown · non-contrast
Comparison: None

CLINICAL DATA: RIGHT foot and ankle pain for 2 months, no known
injury

EXAM:
RIGHT OS CALCIS - 2+ VIEW

[calcaneus axial]
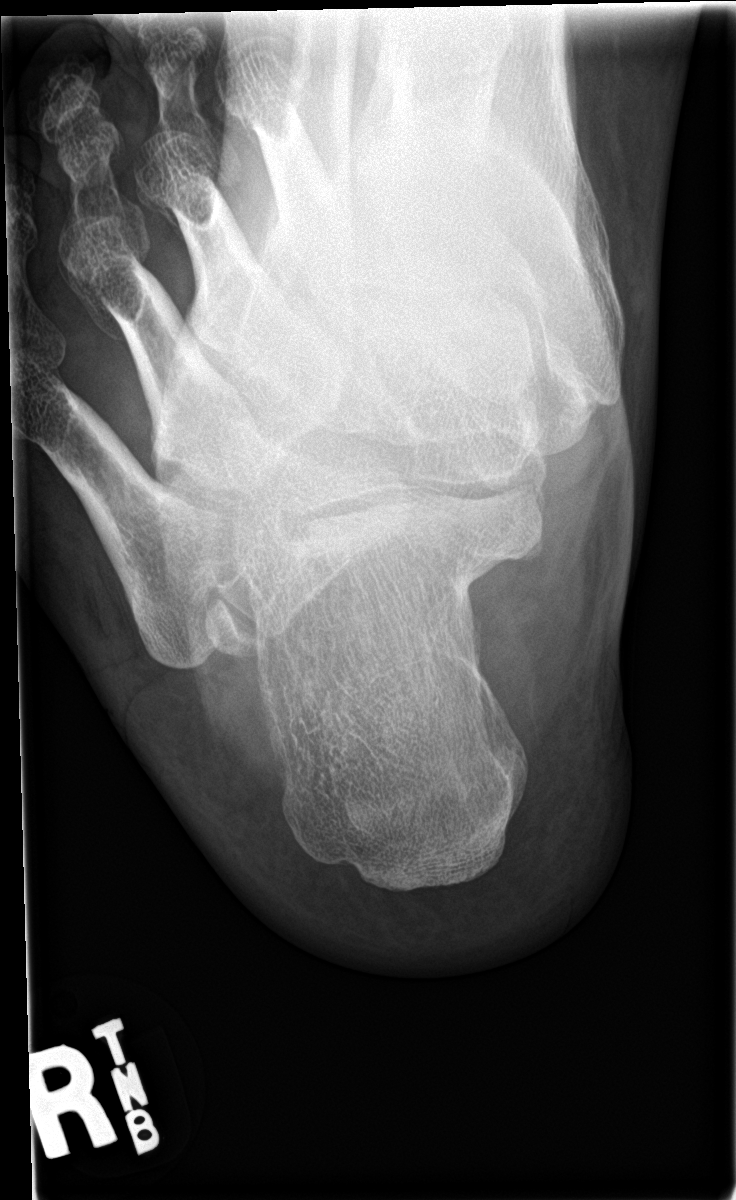

[calcaneus lat]
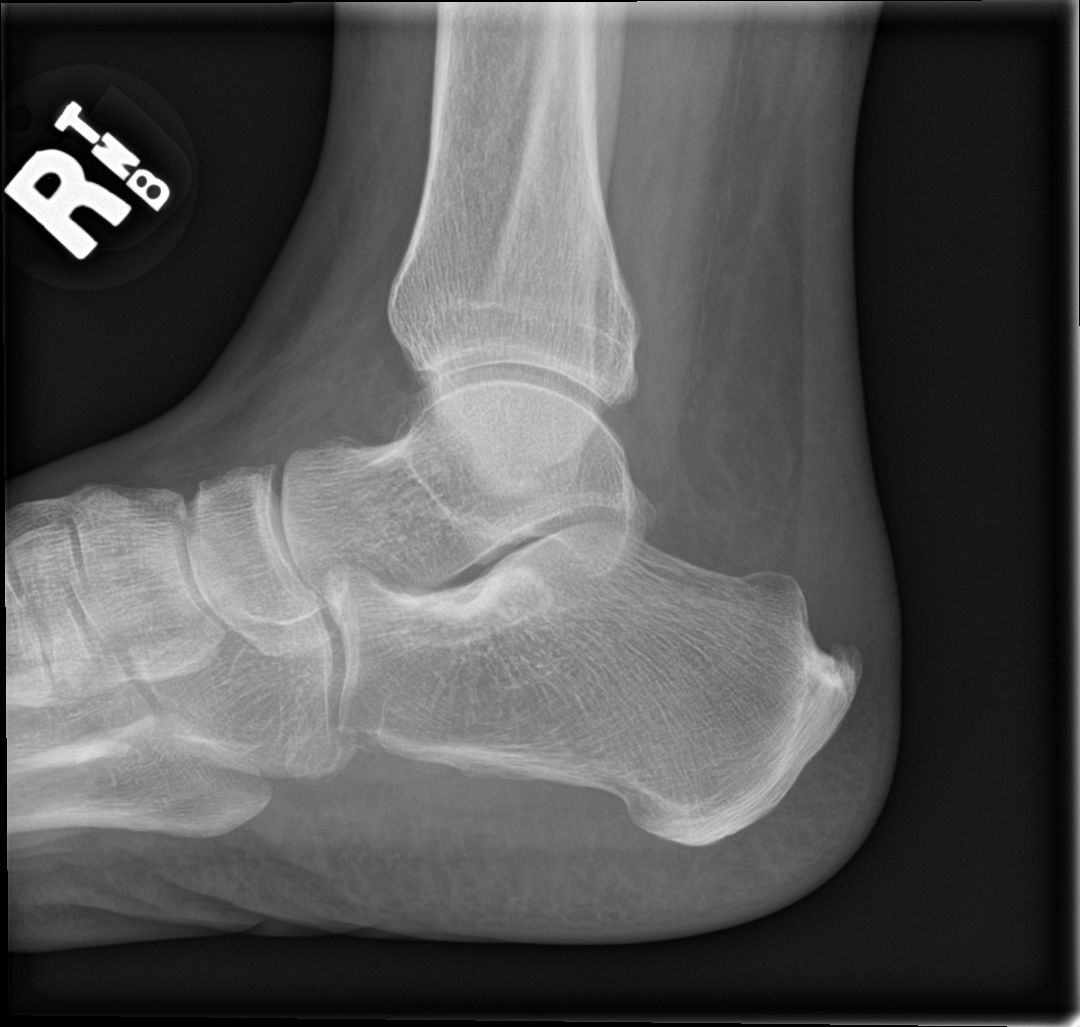

[2 of 2 positions shown; findings below may reference images not displayed]

FINDINGS: Osseous mineralization normal.

Small Achilles insertion calcaneal spur.

No acute fracture, dislocation, or bone destruction.
IMPRESSION: No acute osseous abnormalities.

## 2018-12-24 ENCOUNTER — Telehealth: Payer: Self-pay

## 2018-12-24 NOTE — Telephone Encounter (Signed)
Patient called stating the metformin is causing her stomach to hurt. She advised me to call her house phone and leave a v/m. [phone]  I HAVE RETURNED HER CALL AND LEFT HER A V/M STATING HAT JANECE WANTS HER TO TAKE 1/2 TABLET DAILY OF HER METFORMIN FOR A FEW DAYS AND SEE HOW SHE FEELS AND SHE ALSO WANTED ME TO LET HER KNOW THAT ABDOMINAL PAIN IS A COMMON SIDE EFFECT OF METFORMIN. Lonia Mad

## 2018-12-26 MED FILL — LIVALO 4 MG TABLET: 4 | 90 days supply | Qty: 90 | Fill #0

## 2019-01-02 MED FILL — metFORMIN HCL 500 MG TABS: 500 | 30 days supply | Qty: 30 | Fill #1

## 2019-01-14 ENCOUNTER — Other Ambulatory Visit: Payer: Self-pay

## 2019-01-14 ENCOUNTER — Encounter: Payer: Self-pay | Admitting: Nurse Practitioner

## 2019-01-14 ENCOUNTER — Ambulatory Visit: Payer: 59 | Admitting: Nurse Practitioner

## 2019-01-14 VITALS — BP 100/60 | HR 72 | Temp 98.2°F | Ht 59.6 in | Wt 113.2 lb

## 2019-01-14 DIAGNOSIS — R7303 Prediabetes: Secondary | ICD-10-CM | POA: Diagnosis not present

## 2019-01-14 DIAGNOSIS — Z8619 Personal history of other infectious and parasitic diseases: Secondary | ICD-10-CM

## 2019-01-14 MED ORDER — METFORMIN HCL ER 500 MG PO TB24: 500.0000 mg | ORAL_TABLET | Freq: Every day | ORAL | 2 refills | Status: DC

## 2019-01-14 MED ORDER — VALACYCLOVIR HCL 500 MG PO TABS: 500.0000 mg | ORAL_TABLET | Freq: Two times a day (BID) | ORAL | 1 refills | Status: DC

## 2019-01-14 NOTE — Progress Notes (Signed)
Subjective:     Patient ID: Jaime Rodriguez , female    DOB: July 19, 1950 , 68 y.o.   MRN: [id number]   Chief Complaint  Patient presents with  . prediabetes    Patient presents today for a 6 week med check on metformin. she is still having issues with her stomach while taking her the metformin. she stated she is currently cutting her pills in half and taking it.    HPI  She is taking metformin 500 mg cutting in half due to stomach upset. She also feels her appetite a little.  She will have stinging sensation to her stomach about 2-3 hours after taking metformin. She is not having loose stools. She will drink about 3 bottles of water.    Wt Readings from Last 3 Encounters: 01/14/19 : 113 lb 3.2 oz (51.3 kg) 12/03/18 : 116 lb (52.6 kg) 11/28/18 : 114 lb (51.7 kg)     Past Medical History:  Diagnosis Date  . Allergy   . Diabetes mellitus   . Fibrocystic disease of breast    LEFT BREAST SURGERY  . Hemifacial spasm 12/18/2012   Left lower face  . Hyperlipidemia   . Postmenopausal      Family History  Problem Relation Age of Onset  . Cancer Mother   . Other Mother        sepsis  . Stroke Father   . Prostate cancer Brother   . Cancer Maternal Grandmother   . Asthma Sister   . Hyperlipidemia Sister   . Diabetes Sister   . Cancer Brother   . Hypertension Sister   . Anxiety disorder Other   . Diabetes Other   . Hyperlipidemia Other      Current Outpatient Medications:  .  aspirin 81 MG tablet, Take 81 mg by mouth daily., Disp: , Rfl:  .  botulinum toxin Type A (BOTOX) 100 units SOLR injection, Inject 100 Units into the muscle every 3 (three) months., Disp: , Rfl:  .  diclofenac sodium (VOLTAREN) 1 % GEL, Apply 2 g topically 4 (four) times daily as needed., Disp: 1 Tube, Rfl: 3 .  loratadine (CLARITIN) 10 MG tablet, Take 10 mg by mouth daily., Disp: , Rfl:  .  metFORMIN (GLUCOPHAGE) 500 MG tablet, Take 1 tablet (500 mg total) by mouth daily with breakfast., Disp: 30 tablet,  Rfl: 2 .  Pitavastatin Calcium (LIVALO) 4 MG TABS, Take 1 tablet (4 mg total) by mouth daily., Disp: 90 tablet, Rfl: 1  Current Facility-Administered Medications:  .  botulinum toxin Type A (BOTOX) injection 100 Units, 100 Units, Intramuscular, Once, Marcial Pacas, MD   Allergies  Allergen Reactions  . Penicillins      Review of Systems   Today's Vitals   01/14/19 1412  BP: 100/60  Pulse: 72  Temp: 98.2 F (36.8 C)  TempSrc: Oral  Weight: 113 lb 3.2 oz (51.3 kg)  Height: 4' 11.6" (1.514 m)  PainSc: 0-No pain   Body mass index is 22.41 kg/m.   Objective:  Physical Exam Constitutional:      Appearance: Normal appearance. She is well-developed.  HENT:     Right Ear: Hearing normal.     Left Ear: Hearing normal.     Nose: Nose normal.  Eyes:     General: Lids are normal.     Conjunctiva/sclera: Conjunctivae normal.     Pupils: Pupils are equal, round, and reactive to light.     Funduscopic exam:    Right  eye: No papilledema.        Left eye: No papilledema.  Neck:     Musculoskeletal: Full passive range of motion without pain, normal range of motion and neck supple.     Thyroid: No thyroid mass.     Vascular: No carotid bruit.  Cardiovascular:     Rate and Rhythm: Normal rate and regular rhythm.     Pulses: Normal pulses.     Heart sounds: Normal heart sounds. No murmur.  Pulmonary:     Effort: Pulmonary effort is normal. No respiratory distress.     Breath sounds: Normal breath sounds.  Abdominal:     General: Abdomen is flat. Bowel sounds are normal.     Palpations: Abdomen is soft.  Skin:    Capillary Refill: Capillary refill takes less than 2 seconds.  Neurological:     General: No focal deficit present.     Mental Status: She is alert and oriented to person, place, and time.     Cranial Nerves: No cranial nerve deficit.     Sensory: No sensory deficit.  Psychiatric:        Mood and Affect: Mood normal.        Behavior: Behavior normal.        Thought  Content: Thought content normal.        Judgment: Judgment normal.         Assessment And Plan:  1. Prediabetes  Will switch to XR metformin due to GI upset  Will check HgbA1c at next visit - metFORMIN (GLUCOPHAGE-XR) 500 MG 24 hr tablet; Take 1 tablet (500 mg total) by mouth daily with breakfast.  Dispense: 30 tablet; Refill: 2  2. History of shingles  Intermittent stinging to left shoulder where she had shingles previously no obvious rash at this time - valACYclovir (VALTREX) 500 MG tablet; Take 1 tablet (500 mg total) by mouth 2 (two) times daily.  Dispense: 30 tablet; Refill: 1   [name], FNP    THE PATIENT IS ENCOURAGED TO PRACTICE SOCIAL DISTANCING DUE TO THE COVID-19 PANDEMIC.

## 2019-01-17 DIAGNOSIS — H52223 Regular astigmatism, bilateral: Secondary | ICD-10-CM | POA: Diagnosis not present

## 2019-01-17 DIAGNOSIS — H5213 Myopia, bilateral: Secondary | ICD-10-CM | POA: Diagnosis not present

## 2019-01-17 DIAGNOSIS — H524 Presbyopia: Secondary | ICD-10-CM | POA: Diagnosis not present

## 2019-02-10 ENCOUNTER — Telehealth: Payer: Self-pay | Admitting: Neurology

## 2019-02-10 NOTE — Telephone Encounter (Signed)
I have called patient and LVM regarding rescheduling 11/25 botox injection due to MD out.

## 2019-02-12 ENCOUNTER — Other Ambulatory Visit: Payer: Self-pay | Admitting: Nurse Practitioner

## 2019-02-12 MED FILL — FREESTYLE LANCETS: 50 days supply | Qty: 100 | Fill #0

## 2019-02-12 MED FILL — FREESTYLE LITE TEST STRIP: 50 days supply | Qty: 100 | Fill #0

## 2019-02-12 MED FILL — metFORMIN HCL ER 500 MG TB2: 500 | 30 days supply | Qty: 30 | Fill #1

## 2019-02-12 NOTE — Telephone Encounter (Signed)
Pt returned call, to r./s appt on 11/25 , pt is wanting to know if she can be seen sooner or the 1st week in December. No availability until December 28

## 2019-02-12 NOTE — Telephone Encounter (Signed)
I spoke to the patient and she has been rescheduled for 03/13/2019.

## 2019-02-17 MED FILL — PREVIDENT 5000 BOOSTER PLUS: 1.1 | 25 days supply | Qty: 100 | Fill #0

## 2019-03-05 ENCOUNTER — Ambulatory Visit: Payer: Self-pay | Admitting: Neurology

## 2019-03-13 ENCOUNTER — Ambulatory Visit (INDEPENDENT_AMBULATORY_CARE_PROVIDER_SITE_OTHER): Payer: 59 | Admitting: Neurology

## 2019-03-13 ENCOUNTER — Encounter: Payer: Self-pay | Admitting: Neurology

## 2019-03-13 ENCOUNTER — Other Ambulatory Visit: Payer: Self-pay

## 2019-03-13 VITALS — BP 128/73 | HR 62 | Temp 97.8°F | Ht 60.0 in | Wt 112.0 lb

## 2019-03-13 DIAGNOSIS — G5132 Clonic hemifacial spasm, left: Secondary | ICD-10-CM

## 2019-03-13 MED ORDER — ONABOTULINUMTOXINA 100 UNITS IJ SOLR
100.0000 [IU] | Freq: Once | INTRAMUSCULAR | Status: AC
  Administered 2019-03-13: 100 [IU] via INTRAMUSCULAR

## 2019-03-13 NOTE — Progress Notes (Signed)
**  Botox 100 units x 1, NDC 5217-4715-95, Lot [id number], Exp 07/2021, office supply.//mck,rn**

## 2019-03-13 NOTE — Progress Notes (Signed)
PATIENT: Jaime Rodriguez DOB: 23-Jan-1951  Chief Complaint  Patient presents with  . Hemifacial Spasm    Botox 100 units x 1 - office supply     HISTORICAL   Jaime Rodriguez is a 68 y.o.  right-handed black female referred by Dr. Jannifer Franklin for EMG guided botulinumtoxin injection for left facial spasm  She had a history of left-sided hemifacial spasm that began approximately a year ago in 2013. The patient was seen by Dr. Melton Alar, and she underwent MRI evaluation of the brain with and without gadolinium that was unremarkable. The patient underwent Botox injections in 2013,   She did well with the Botox, and the benefit lasted almost one year. She did has mild left facial droop after the injection.  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   She denies any balance issues, speech problems or swallowing problems. The patient denies any change in vision. She denies a previous history of left Bell's palsy.  In recent 1 year, since 2014, She began to have urinary urgency, she never had children in the past she denies bilateral upper and lower extremity paresthesia   UPDATE March 7th 2016:  Last injection was in Feb 2015, very helpful, did very well, she noticed recurrence of her left facial twitching, almost constant, she hoped to continue EMG guided xeomin injection for her left facial spasm,   UPDATE April 28th 2016: Her insurance refused xeomin, approved for Botox A 100 unit  UPDATE May 24 2015:She responded very well to previous Botox injection, came in for repeat injection again  Update September 14 2015: When I met her initially in 2014, she complains of urinary urgency, she has never had a vaginal delivery, her urinary urgency has been fairly stable, she denies gait difficulty, denies bowel and  bladder incontinence, denied lower extremity paresthesia, she wants to initiate evaluation, she does has hyperreflexia on examinations,.  Update May 16, 2017:  Last injection was in June 2017, she responded very well, now with frequent left hemifacial spasm again,  UPDATE Aug 29 2017: She responded very well to previous injection in Feb 2019, no significant side effect noticed  UPDATE December 05 2017: She did weill with previous injection on Aug 29 2017.  UPDATE Aug 21 2018: She responded very well to previous injection, there was no significant side effect noted.  UPDATE November 28 2018: She did well to previous injection  UPDATE Mar 13 2019: She did well with previous injection  On exam: She has occasionally left hemifacial muscle spasm, involving left orbicularis oculi, orbicularis oris, mild left cheek puff weakness. She has mild  left eye closure weakness.    Assessment/Plan:  Left hemifacial spasm, responded very well to previous EMG guided botulism toxin injection,   EMG guided Botox a injection use 100 units, dissolvable to 2 cc of normal saline, used 40 units, discard 60 units  Left orbicularis oculi, injection was placed at 2,3,4,5 6, 8,   oclock (2.5 x 6=15  units )  Right corrugate 2.5 units Left corrugate 2.5 units Procerus 2.5 units  Left zygomatic major 2.5 units   Left platysmas 5 units Right platysmas 5 units Right orbicularis oculi at 4, 5,  (2.5x2=5)    Marcial Pacas, M.D. Ph.D.  [facility] [place], [place] [address] Phone:  [phone] Fax:      [phone]

## 2019-03-20 MED FILL — LIVALO 4 MG TABLET: 4 | 90 days supply | Qty: 90 | Fill #1

## 2019-03-20 MED FILL — metFORMIN HCL ER 500 MG TB2: 500 | 30 days supply | Qty: 30 | Fill #2

## 2019-04-19 ENCOUNTER — Other Ambulatory Visit: Payer: Self-pay | Admitting: Nurse Practitioner

## 2019-04-19 DIAGNOSIS — R7303 Prediabetes: Secondary | ICD-10-CM

## 2019-04-21 MED FILL — metFORMIN HCL ER 500 MG TB2: 500 | 30 days supply | Qty: 30 | Fill #0

## 2019-04-22 ENCOUNTER — Other Ambulatory Visit: Payer: Self-pay | Admitting: Nurse Practitioner

## 2019-04-22 DIAGNOSIS — Z1211 Encounter for screening for malignant neoplasm of colon: Secondary | ICD-10-CM

## 2019-05-20 MED FILL — metFORMIN HCL ER 500 MG TB2: 500 | 30 days supply | Qty: 30 | Fill #1

## 2019-06-05 ENCOUNTER — Encounter: Payer: Self-pay | Admitting: Nurse Practitioner

## 2019-06-05 ENCOUNTER — Ambulatory Visit (INDEPENDENT_AMBULATORY_CARE_PROVIDER_SITE_OTHER): Payer: 59 | Admitting: Nurse Practitioner

## 2019-06-05 ENCOUNTER — Other Ambulatory Visit: Payer: Self-pay

## 2019-06-05 VITALS — BP 130/68 | HR 93 | Temp 98.1°F | Ht 60.0 in | Wt 110.4 lb

## 2019-06-05 DIAGNOSIS — E782 Mixed hyperlipidemia: Secondary | ICD-10-CM | POA: Diagnosis not present

## 2019-06-05 DIAGNOSIS — R7303 Prediabetes: Secondary | ICD-10-CM

## 2019-06-05 NOTE — Progress Notes (Addendum)
Subjective:     Patient ID: Jaime Rodriguez , female    DOB: [birth year] , 69 y.o.   MRN: [id number]   Chief Complaint  Patient presents with  . Diabetes    HPI  She is now on the long acting which is working better than the regular metformin.  Some days she does well with the water drinking.     Wt Readings from Last 3 Encounters: 06/05/19 : 110 lb 6.4 oz (50.1 kg) 03/13/19 : 112 lb (50.8 kg) 01/14/19 : 113 lb 3.2 oz (51.3 kg)  She received the covid 19 with the second one being on Tuesday - she had fatigue.    Diabetes She presents for her follow-up diabetic visit. Diabetes type: prediabetes. Her disease course has been stable. There are no hypoglycemic associated symptoms. There are no diabetic associated symptoms. There are no hypoglycemic complications. There are no diabetic complications. Risk factors for coronary artery disease include sedentary lifestyle. Current diabetic treatment includes oral agent (monotherapy). Her weight is stable. When asked about meal planning, she reported none. She has not had a previous visit with a dietitian. She rarely participates in exercise. (Blood sugars averaging 80-90. ) Eye exam is not current.     Past Medical History:  Diagnosis Date  . Allergy   . Diabetes mellitus   . Fibrocystic disease of breast    LEFT BREAST SURGERY  . Hemifacial spasm 12/18/2012   Left lower face  . Hyperlipidemia   . Postmenopausal      Family History  Problem Relation Age of Onset  . Cancer Mother   . Other Mother        sepsis  . Stroke Father   . Prostate cancer Brother   . Cancer Maternal Grandmother   . Asthma Sister   . Hyperlipidemia Sister   . Diabetes Sister   . Cancer Brother   . Hypertension Sister   . Anxiety disorder Other   . Diabetes Other   . Hyperlipidemia Other      Current Outpatient Medications:  .  aspirin 81 MG tablet, Take 81 mg by mouth daily., Disp: , Rfl:  .  botulinum toxin Type A (BOTOX) 100 units SOLR injection,  Inject 100 Units into the muscle every 3 (three) months., Disp: , Rfl:  .  diclofenac sodium (VOLTAREN) 1 % GEL, Apply 2 g topically 4 (four) times daily as needed., Disp: 1 Tube, Rfl: 3 .  FREESTYLE LITE test strip, USE TWICE A DAY BEFORE BREAKFAST & DINNER AS DIRECTED, Disp: 100 strip, Rfl: 12 .  Lancets (FREESTYLE) lancets, USE TWICE A DAY BEFORE BREAKFAST & DINNER AS DIRECTED, Disp: 100 each, Rfl: 12 .  loratadine (CLARITIN) 10 MG tablet, Take 10 mg by mouth daily., Disp: , Rfl:  .  metFORMIN (GLUCOPHAGE-XR) 500 MG 24 hr tablet, TAKE 1 TABLET BY MOUTH DAILY WITH BREAKFAST, Disp: 30 tablet, Rfl: 2 .  Pitavastatin Calcium (LIVALO) 4 MG TABS, Take 1 tablet (4 mg total) by mouth daily., Disp: 90 tablet, Rfl: 1   Allergies  Allergen Reactions  . Penicillins      Review of Systems   Today's Vitals   06/05/19 1423  BP: 130/68  Pulse: 93  Temp: 98.1 F (36.7 C)  Weight: 110 lb 6.4 oz (50.1 kg)  Height: 5' (1.524 m)   Body mass index is 21.56 kg/m.   Objective:  Physical Exam Constitutional:      Appearance: Normal appearance. She is well-developed.  HENT:  Right Ear: Hearing normal.     Left Ear: Hearing normal.     Nose: Nose normal.  Eyes:     General: Lids are normal.     Conjunctiva/sclera: Conjunctivae normal.     Pupils: Pupils are equal, round, and reactive to light.     Funduscopic exam:    Right eye: No papilledema.        Left eye: No papilledema.  Neck:     Thyroid: No thyroid mass.     Vascular: No carotid bruit.  Cardiovascular:     Rate and Rhythm: Normal rate and regular rhythm.     Pulses: Normal pulses.     Heart sounds: Normal heart sounds. No murmur.  Pulmonary:     Effort: Pulmonary effort is normal. No respiratory distress.     Breath sounds: Normal breath sounds.  Abdominal:     General: Abdomen is flat. Bowel sounds are normal.     Palpations: Abdomen is soft.  Musculoskeletal:     Cervical back: Full passive range of motion without pain,  normal range of motion and neck supple.  Skin:    Capillary Refill: Capillary refill takes less than 2 seconds.  Neurological:     General: No focal deficit present.     Mental Status: She is alert and oriented to person, place, and time.     Cranial Nerves: No cranial nerve deficit.     Sensory: No sensory deficit.  Psychiatric:        Mood and Affect: Mood normal.        Behavior: Behavior normal.        Thought Content: Thought content normal.        Judgment: Judgment normal.         Assessment And Plan:   1. Prediabetes  Chronic   She is tolerating her metformin XR  - Hemoglobin A1c - CMP14+EGFR  2. Mixed hyperlipidemia  Chronic, stable  Continue with current medications - CMP14+EGFR - Lipid panel    [name], FNP    THE PATIENT IS ENCOURAGED TO PRACTICE SOCIAL DISTANCING DUE TO THE COVID-19 PANDEMIC.

## 2019-06-06 LAB — CMP14+EGFR
ALT: 11 IU/L (ref 0–32)
AST: 17 IU/L (ref 0–40)
Albumin/Globulin Ratio: 1.5 (ref 1.2–2.2)
Albumin: 4.3 g/dL (ref 3.8–4.8)
Alkaline Phosphatase: 90 IU/L (ref 39–117)
BUN/Creatinine Ratio: 14 (ref 12–28)
BUN: 15 mg/dL (ref 8–27)
Bilirubin Total: <0.2 mg/dL (ref 0.0–1.2)
CO2: 26 mmol/L (ref 20–29)
Calcium: 9.2 mg/dL (ref 8.7–10.3)
Chloride: 103 mmol/L (ref 96–106)
Creatinine, Ser: 1.04 mg/dL — ABNORMAL HIGH (ref 0.57–1.00)
GFR calc Af Amer: 63 mL/min/{1.73_m2} (ref >59)
GFR calc non Af Amer: 55 mL/min/{1.73_m2} — ABNORMAL LOW (ref >59)
Globulin, Total: 2.8 g/dL (ref 1.5–4.5)
Glucose: 136 mg/dL — ABNORMAL HIGH (ref 65–99)
Potassium: 4.4 mmol/L (ref 3.5–5.2)
Sodium: 143 mmol/L (ref 134–144)
Total Protein: 7.1 g/dL (ref 6.0–8.5)

## 2019-06-06 LAB — LIPID PANEL
Chol/HDL Ratio: 3 ratio (ref 0.0–4.4)
Cholesterol, Total: 217 mg/dL — ABNORMAL HIGH (ref 100–199)
HDL: 73 mg/dL (ref >39)
LDL Chol Calc (NIH): 133 mg/dL — ABNORMAL HIGH (ref 0–99)
Triglycerides: 61 mg/dL (ref 0–149)
VLDL Cholesterol Cal: 11 mg/dL (ref 5–40)

## 2019-06-06 LAB — HEMOGLOBIN A1C
Est. average glucose Bld gHb Est-mCnc: 126 mg/dL
Hgb A1c MFr Bld: 6 % — ABNORMAL HIGH (ref 4.8–5.6)

## 2019-06-12 ENCOUNTER — Ambulatory Visit (INDEPENDENT_AMBULATORY_CARE_PROVIDER_SITE_OTHER): Payer: 59 | Admitting: Neurology

## 2019-06-12 ENCOUNTER — Other Ambulatory Visit: Payer: Self-pay

## 2019-06-12 ENCOUNTER — Encounter: Payer: Self-pay | Admitting: Neurology

## 2019-06-12 VITALS — BP 130/73 | HR 90 | Temp 98.2°F | Ht 60.0 in | Wt 111.0 lb

## 2019-06-12 DIAGNOSIS — G5132 Clonic hemifacial spasm, left: Secondary | ICD-10-CM

## 2019-06-12 MED ORDER — ONABOTULINUMTOXINA 100 UNITS IJ SOLR
100.0000 [IU] | Freq: Once | INTRAMUSCULAR | Status: AC
  Administered 2019-06-12: 100 [IU] via INTRAMUSCULAR

## 2019-06-12 NOTE — Progress Notes (Signed)
**  Botox 100 units x 1 vial, NDC 0023-1145-01, Lot [id number], Exp 01/2022, office supply.//mck,rn** 

## 2019-06-12 NOTE — Progress Notes (Signed)
PATIENT: Jaime Rodriguez DOB: 1950-12-10  Chief Complaint  Patient presents with  . Facial Spasms    Botox 100 units x 1 vial - office supply     HISTORICAL   Jaime Rodriguez is a 69 y.o.  right-handed black female referred by Dr. Jannifer Franklin for EMG guided botulinumtoxin injection for left facial spasm  She had a history of left-sided hemifacial spasm that began approximately a year ago in 2013. The patient was seen by Dr. Melton Alar, and she underwent MRI evaluation of the brain with and without gadolinium that was unremarkable. The patient underwent Botox injections in 2013,   She did well with the Botox, and the benefit lasted almost one year. She did has mild left facial droop after the injection.  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   She denies any balance issues, speech problems or swallowing problems. The patient denies any change in vision. She denies a previous history of left Bell's palsy.  In recent 1 year, since 2014, She began to have urinary urgency, she never had children in the past she denies bilateral upper and lower extremity paresthesia   UPDATE March 7th 2016:  Last injection was in Feb 2015, very helpful, did very well, she noticed recurrence of her left facial twitching, almost constant, she hoped to continue EMG guided xeomin injection for her left facial spasm,   UPDATE April 28th 2016: Her insurance refused xeomin, approved for Botox A 100 unit  UPDATE May 24 2015:She responded very well to previous Botox injection, came in for repeat injection again  Update September 14 2015: When I met her initially in 2014, she complains of urinary urgency, she has never had a vaginal delivery, her urinary urgency has been fairly stable, she denies gait difficulty, denies bowel and  bladder incontinence, denied lower extremity paresthesia, she wants to initiate evaluation, she does has hyperreflexia on examinations,.  Update May 16, 2017:  Last injection was in June 2017, she responded very well, now with frequent left hemifacial spasm again,  UPDATE Aug 29 2017: She responded very well to previous injection in Feb 2019, no significant side effect noticed  UPDATE December 05 2017: She did weill with previous injection on Aug 29 2017.  UPDATE Aug 21 2018: She responded very well to previous injection, there was no significant side effect noted.  UPDATE November 28 2018: She did well to previous injection  UPDATE Mar 13 2019: She did well with previous injection  UPDATE June 12 2019: She responded well to previous injection  On exam: She has occasionally left hemifacial muscle spasm, involving left orbicularis oculi, orbicularis oris, mild left cheek puff weakness. She has mild  left eye closure weakness.    Assessment/Plan:  Left hemifacial spasm, responded very well to previous EMG guided botulism toxin injection,   EMG guided Botox a injection use 100 units, dissolvable to 2 cc of normal saline, used 52.5 units, discard 47.5 units  Left orbicularis oculi, injection was placed at 2,3,4,5 6, 8,   oclock (2.5 x 6=15  units )  Right corrugate  5 units Left corrugate  5 units Procerus 5 units  Right frontalis 5 units Left frontalis 5 units  Left zygomatic major 2.5 units Left depressor labial inferioris 5 units   Right orbicularis oculi at 4,  5,  (2.5x2=5)    Marcial Pacas, M.D. Ph.D.  [facility] [place], [place] [address] Phone: [phone] Fax:      [phone]

## 2019-06-18 ENCOUNTER — Other Ambulatory Visit: Payer: Self-pay | Admitting: Nurse Practitioner

## 2019-06-18 DIAGNOSIS — E782 Mixed hyperlipidemia: Secondary | ICD-10-CM

## 2019-06-18 MED FILL — LIVALO 4 MG TABLET: 4 | 90 days supply | Qty: 90 | Fill #0

## 2019-06-19 MED FILL — metFORMIN HCL ER 500 MG TB2: 500 | 30 days supply | Qty: 30 | Fill #2

## 2019-07-18 ENCOUNTER — Other Ambulatory Visit: Payer: Self-pay | Admitting: Nurse Practitioner

## 2019-07-18 DIAGNOSIS — R7303 Prediabetes: Secondary | ICD-10-CM

## 2019-07-18 MED FILL — metFORMIN HCL ER 500 MG TB2: 500 | 30 days supply | Qty: 30 | Fill #0

## 2019-07-25 DIAGNOSIS — L988 Other specified disorders of the skin and subcutaneous tissue: Secondary | ICD-10-CM | POA: Diagnosis not present

## 2019-08-19 ENCOUNTER — Telehealth: Payer: Self-pay | Admitting: Neurology

## 2019-08-19 NOTE — Telephone Encounter (Signed)
Patient has Botox appointment on 6/2. I called [facility] [phone]) and spoke with Sundral. She states [phone] and [phone] are valid and billable and no Berkley Harvey is required. Ref #[id number].

## 2019-08-22 DIAGNOSIS — E119 Type 2 diabetes mellitus without complications: Secondary | ICD-10-CM | POA: Diagnosis not present

## 2019-08-22 DIAGNOSIS — L988 Other specified disorders of the skin and subcutaneous tissue: Secondary | ICD-10-CM | POA: Diagnosis not present

## 2019-09-10 ENCOUNTER — Other Ambulatory Visit: Payer: Self-pay

## 2019-09-10 ENCOUNTER — Ambulatory Visit: Payer: 59 | Admitting: Neurology

## 2019-09-10 ENCOUNTER — Encounter: Payer: Self-pay | Admitting: Neurology

## 2019-09-10 VITALS — BP 132/71 | HR 98 | Ht 60.0 in | Wt 113.5 lb

## 2019-09-10 DIAGNOSIS — G5132 Clonic hemifacial spasm, left: Secondary | ICD-10-CM

## 2019-09-10 MED ORDER — ONABOTULINUMTOXINA 100 UNITS IJ SOLR
100.0000 [IU] | Freq: Once | INTRAMUSCULAR | Status: AC
  Administered 2019-09-10: 100 [IU] via INTRAMUSCULAR

## 2019-09-10 NOTE — Progress Notes (Signed)
**  Botox 100 units x 1 vial, NDC 8921-1941-74, Lot [id number], Exp 01/2022, office supply.//mck,rn**

## 2019-09-10 NOTE — Progress Notes (Signed)
PATIENT: Jaime Rodriguez DOB: 1950-11-25  Chief Complaint  Patient presents with  . Hemifacial Spasm    Botox 100 units x 1 vial - office supply     HISTORICAL   Jaime Rodriguez is a 69 y.o.  right-handed black female referred by Dr. Jannifer Franklin for EMG guided botulinumtoxin injection for left facial spasm  She had a history of left-sided hemifacial spasm that began approximately a year ago in 2013. The patient was seen by Dr. Melton Alar, and she underwent MRI evaluation of the brain with and without gadolinium that was unremarkable. The patient underwent Botox injections in 2013,   She did well with the Botox, and the benefit lasted almost one year. She did has mild left facial droop after the injection.  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   She denies any balance issues, speech problems or swallowing problems. The patient denies any change in vision. She denies a previous history of left Bell's palsy.  In recent 1 year, since 2014, She began to have urinary urgency, she never had children in the past she denies bilateral upper and lower extremity paresthesia   UPDATE March 7th 2016:  Last injection was in Feb 2015, very helpful, did very well, she noticed recurrence of her left facial twitching, almost constant, she hoped to continue EMG guided xeomin injection for her left facial spasm,   UPDATE April 28th 2016: Her insurance refused xeomin, approved for Botox A 100 unit  UPDATE May 24 2015:She responded very well to previous Botox injection, came in for repeat injection again  Update September 14 2015: When I met her initially in 2014, she complains of urinary urgency, she has never had a vaginal delivery, her urinary urgency has been fairly stable, she denies gait difficulty, denies bowel  and bladder incontinence, denied lower extremity paresthesia, she wants to initiate evaluation, she does has hyperreflexia on examinations,.  Update May 16, 2017:  Last injection was in June 2017, she responded very well, now with frequent left hemifacial spasm again,  UPDATE Aug 29 2017: She responded very well to previous injection in Feb 2019, no significant side effect noticed  UPDATE December 05 2017: She did weill with previous injection on Aug 29 2017.  UPDATE Aug 21 2018: She responded very well to previous injection, there was no significant side effect noted.  UPDATE November 28 2018: She did well to previous injection  UPDATE Mar 13 2019: She did well with previous injection  UPDATE June 12 2019: She responded well to previous injection  On exam: She has occasionally left hemifacial muscle spasm, involving left orbicularis oculi, orbicularis oris, mild left cheek puff weakness. She has mild  left eye closure weakness.    Assessment/Plan:  Left hemifacial spasm, responded very well to previous EMG guided botulism toxin injection,   EMG guided Botox a injection use 100 units, dissolvable to 2 cc of normal saline, used 55 units, discard 45 units  Left orbicularis oculi, injection was placed at 2,3,4,5 6, 8,10   oclock (2.5 x 7=17.5  units )  Right corrugate  5 units Left corrugate  5 units Procerus 5 units  Right frontalis 5 units Left frontalis 5 units  Left zygomatic major 2.5 units Left depressor labial inferioris 5 units   Right orbicularis oculi at 4,  5,  (2.5x2=5)    Marcial Pacas, M.D. Ph.D.  [facility] [place], [place] [address] Phone: [phone] Fax:      [phone]

## 2019-10-16 ENCOUNTER — Other Ambulatory Visit: Payer: Self-pay | Admitting: Nurse Practitioner

## 2019-10-16 DIAGNOSIS — R7303 Prediabetes: Secondary | ICD-10-CM

## 2019-10-16 MED FILL — metFORMIN HCL ER 500 MG TB2: 500 | 30 days supply | Qty: 30 | Fill #0

## 2019-11-19 MED FILL — metFORMIN HCL ER 500 MG TB2: 500 | 30 days supply | Qty: 30 | Fill #1

## 2019-11-24 MED FILL — LIVALO 4 MG TABLET: 4 | 90 days supply | Qty: 90 | Fill #1

## 2019-11-26 ENCOUNTER — Encounter: Payer: Self-pay | Admitting: Nurse Practitioner

## 2019-12-01 ENCOUNTER — Other Ambulatory Visit: Payer: Self-pay | Admitting: Nurse Practitioner

## 2019-12-01 DIAGNOSIS — E782 Mixed hyperlipidemia: Secondary | ICD-10-CM

## 2019-12-01 DIAGNOSIS — R7303 Prediabetes: Secondary | ICD-10-CM

## 2019-12-01 NOTE — Telephone Encounter (Signed)
Please fax her lab orders to [facility] they are in the system

## 2019-12-03 ENCOUNTER — Other Ambulatory Visit: Payer: Self-pay | Admitting: Nurse Practitioner

## 2019-12-03 DIAGNOSIS — R7303 Prediabetes: Secondary | ICD-10-CM | POA: Diagnosis not present

## 2019-12-03 DIAGNOSIS — E782 Mixed hyperlipidemia: Secondary | ICD-10-CM | POA: Diagnosis not present

## 2019-12-04 LAB — CMP14+EGFR
ALT: 13 IU/L (ref 0–32)
AST: 19 IU/L (ref 0–40)
Albumin/Globulin Ratio: 1.8 (ref 1.2–2.2)
Albumin: 4.6 g/dL (ref 3.8–4.8)
Alkaline Phosphatase: 87 IU/L (ref 48–121)
BUN/Creatinine Ratio: 21 (ref 12–28)
BUN: 20 mg/dL (ref 8–27)
Bilirubin Total: 0.3 mg/dL (ref 0.0–1.2)
CO2: 24 mmol/L (ref 20–29)
Calcium: 9.7 mg/dL (ref 8.7–10.3)
Chloride: 103 mmol/L (ref 96–106)
Creatinine, Ser: 0.96 mg/dL (ref 0.57–1.00)
GFR calc Af Amer: 70 mL/min/{1.73_m2} (ref >59)
GFR calc non Af Amer: 61 mL/min/{1.73_m2} (ref >59)
Globulin, Total: 2.6 g/dL (ref 1.5–4.5)
Glucose: 84 mg/dL (ref 65–99)
Potassium: 4.6 mmol/L (ref 3.5–5.2)
Sodium: 142 mmol/L (ref 134–144)
Total Protein: 7.2 g/dL (ref 6.0–8.5)

## 2019-12-04 LAB — LIPID PANEL
Chol/HDL Ratio: 2.8 ratio (ref 0.0–4.4)
Cholesterol, Total: 214 mg/dL — ABNORMAL HIGH (ref 100–199)
HDL: 76 mg/dL (ref >39)
LDL Chol Calc (NIH): 131 mg/dL — ABNORMAL HIGH (ref 0–99)
Triglycerides: 41 mg/dL (ref 0–149)
VLDL Cholesterol Cal: 7 mg/dL (ref 5–40)

## 2019-12-04 LAB — HEMOGLOBIN A1C
Est. average glucose Bld gHb Est-mCnc: 128 mg/dL
Hgb A1c MFr Bld: 6.1 % — ABNORMAL HIGH (ref 4.8–5.6)

## 2019-12-08 ENCOUNTER — Encounter: Payer: Self-pay | Admitting: Nurse Practitioner

## 2019-12-08 ENCOUNTER — Other Ambulatory Visit: Payer: Self-pay

## 2019-12-08 ENCOUNTER — Ambulatory Visit (INDEPENDENT_AMBULATORY_CARE_PROVIDER_SITE_OTHER): Payer: 59 | Admitting: Nurse Practitioner

## 2019-12-08 ENCOUNTER — Other Ambulatory Visit: Payer: Self-pay | Admitting: Nurse Practitioner

## 2019-12-08 VITALS — BP 116/72 | HR 78 | Temp 98.4°F | Ht 60.0 in | Wt 112.8 lb

## 2019-12-08 DIAGNOSIS — E782 Mixed hyperlipidemia: Secondary | ICD-10-CM | POA: Diagnosis not present

## 2019-12-08 DIAGNOSIS — R7303 Prediabetes: Secondary | ICD-10-CM

## 2019-12-08 DIAGNOSIS — Z23 Encounter for immunization: Secondary | ICD-10-CM

## 2019-12-08 DIAGNOSIS — H6121 Impacted cerumen, right ear: Secondary | ICD-10-CM | POA: Diagnosis not present

## 2019-12-08 DIAGNOSIS — Z0000 Encounter for general adult medical examination without abnormal findings: Secondary | ICD-10-CM | POA: Diagnosis not present

## 2019-12-08 LAB — POCT URINALYSIS DIPSTICK
Bilirubin, UA: NEGATIVE
Glucose, UA: NEGATIVE
Nitrite, UA: NEGATIVE
Protein, UA: NEGATIVE
Spec Grav, UA: 1.025 (ref 1.010–1.025)
Urobilinogen, UA: 0.2 E.U./dL
pH, UA: 5 (ref 5.0–8.0)

## 2019-12-08 LAB — POCT UA - MICROALBUMIN
Albumin/Creatinine Ratio, Urine, POC: 30
Creatinine, POC: 200 mg/dL
Microalbumin Ur, POC: 10 mg/L

## 2019-12-08 MED ORDER — LIVALO 4 MG PO TABS: 1.0000 | ORAL_TABLET | Freq: Every day | ORAL | 1 refills | Status: DC

## 2019-12-08 MED ORDER — METFORMIN HCL ER 500 MG PO TB24: 500.0000 mg | ORAL_TABLET | Freq: Every day | ORAL | 1 refills | Status: DC

## 2019-12-08 NOTE — Progress Notes (Signed)
I,[name] as a Neurosurgeon for [employer], FNP.,have documented all relevant documentation on the behalf of [name], FNP,as directed by  [name], FNP while in the presence of [name], FNP.  This visit occurred during the SARS-CoV-2 public health emergency.  Safety protocols were in place, including screening questions prior to the visit, additional usage of staff PPE, and extensive cleaning of exam room while observing appropriate contact time as indicated for disinfecting solutions.  Subjective:     Patient ID: Jaime Rodriguez , female    DOB: 07-05-1950 , 69 y.o.   MRN: [id number]   Chief Complaint  Patient presents with  . Annual Exam    HPI  Here for HM.  She has her eye exam yearly at [facility] in [place].  Diabetes She presents for her follow-up diabetic visit. Diabetes type: prediabetes. Her disease course has been stable. There are no hypoglycemic associated symptoms. There are no diabetic associated symptoms. There are no hypoglycemic complications. Risk factors for coronary artery disease include sedentary lifestyle. Current diabetic treatment includes oral agent (monotherapy). She is compliant with treatment all of the time. When asked about meal planning, she reported none. She has not had a previous visit with a dietitian. She rarely participates in exercise. There is no change in her home blood glucose trend. Her breakfast blood glucose is taken between 6-7 am. She does not see a podiatrist.Eye exam is current (will get a copy of last eye exam).     Past Medical History:  Diagnosis Date  . Allergy   . Diabetes mellitus   . Fibrocystic disease of breast    LEFT BREAST SURGERY  . Hemifacial spasm 12/18/2012   Left lower face  . Hyperlipidemia   . Postmenopausal      Family History  Problem Relation Age of Onset  . Cancer Mother   . Other Mother        sepsis  . Stroke Father   . Prostate cancer Brother   . Cancer Maternal  Grandmother   . Asthma Sister   . Hyperlipidemia Sister   . Diabetes Sister   . Cancer Brother   . Hypertension Sister   . Anxiety disorder Other   . Diabetes Other   . Hyperlipidemia Other      Current Outpatient Medications:  .  aspirin 81 MG tablet, Take 81 mg by mouth daily., Disp: , Rfl:  .  botulinum toxin Type A (BOTOX) 100 units SOLR injection, Inject 100 Units into the muscle every 3 (three) months., Disp: , Rfl:  .  diclofenac sodium (VOLTAREN) 1 % GEL, Apply 2 g topically 4 (four) times daily as needed., Disp: 1 Tube, Rfl: 3 .  FREESTYLE LITE test strip, USE TWICE A DAY BEFORE BREAKFAST & DINNER AS DIRECTED, Disp: 100 strip, Rfl: 12 .  Lancets (FREESTYLE) lancets, USE TWICE A DAY BEFORE BREAKFAST & DINNER AS DIRECTED, Disp: 100 each, Rfl: 12 .  loratadine (CLARITIN) 10 MG tablet, Take 10 mg by mouth daily., Disp: , Rfl:  .  metFORMIN (GLUCOPHAGE-XR) 500 MG 24 hr tablet, Take 1 tablet (500 mg total) by mouth daily with breakfast., Disp: 90 tablet, Rfl: 1 .  Pitavastatin Calcium (LIVALO) 4 MG TABS, Take 1 tablet (4 mg total) by mouth daily., Disp: 90 tablet, Rfl: 1   Allergies  Allergen Reactions  . Penicillins       The patient states she is post menopausal status Negative for Dysmenorrhea and Negative for Menorrhagia.  She sees [facility] for PAP, Dexa, and mammogram which are both scheduled for Sept 1st. Negative for: breast discharge, breast lump(s), breast pain and breast self exam. Associated symptoms include abnormal vaginal bleeding. Pertinent negatives include abnormal bleeding (hematology), anxiety, decreased libido, depression, difficulty falling sleep, dyspareunia, history of infertility, nocturia, sexual dysfunction, sleep disturbances, urinary incontinence, urinary urgency, vaginal discharge and vaginal itching. Diet regular.  She feels she can do better with her diet.  The patient states her exercise level is minimal.  She is planning to do virtual  [facility] with [facility]    The patient's tobacco use is:  Social History   Tobacco Use  Smoking Status Never Smoker  Smokeless Tobacco Never Used   She has been exposed to passive smoke. The patient's alcohol use is:  Social History   Substance and Sexual Activity  Alcohol Use No     Review of Systems  Constitutional: Negative.   HENT: Negative.   Eyes: Negative.   Respiratory: Negative.   Cardiovascular: Negative.   Gastrointestinal: Negative.   Endocrine: Negative.   Genitourinary: Negative.   Musculoskeletal: Negative.   Skin: Negative.   Allergic/Immunologic: Negative.   Neurological: Negative.   Hematological: Negative.   Psychiatric/Behavioral: Negative.      Today's Vitals   12/08/19 1421  BP: 116/72  Pulse: 78  Temp: 98.4 F (36.9 C)  TempSrc: Oral  Weight: 112 lb 12.8 oz (51.2 kg)  Height: 5' (1.524 m)  PainSc: 0-No pain   Body mass index is 22.03 kg/m.   Objective:  Physical Exam Constitutional:      General: She is not in acute distress.    Appearance: Normal appearance. She is well-developed. She is obese.  HENT:     Head: Normocephalic and atraumatic.     Right Ear: Hearing and external ear normal. There is impacted cerumen.     Left Ear: Hearing, tympanic membrane, ear canal and external ear normal. There is no impacted cerumen.     Nose:     Comments: Deferred - masked    Mouth/Throat:     Comments: Deferred - masked Eyes:     General: Lids are normal.     Extraocular Movements: Extraocular movements intact.     Conjunctiva/sclera: Conjunctivae normal.     Pupils: Pupils are equal, round, and reactive to light.     Funduscopic exam:    Right eye: No papilledema.        Left eye: No papilledema.  Neck:     Thyroid: No thyroid mass.     Vascular: No carotid bruit.  Cardiovascular:     Rate and Rhythm: Normal rate and regular rhythm.     Pulses: Normal pulses.     Heart sounds: Normal heart sounds. No murmur heard.   Pulmonary:      Effort: Pulmonary effort is normal.     Breath sounds: Normal breath sounds.  Abdominal:     General: Abdomen is flat. Bowel sounds are normal. There is no distension.     Palpations: Abdomen is soft.     Tenderness: There is no abdominal tenderness.  Genitourinary:    Rectum: Guaiac result negative.  Musculoskeletal:        General: No swelling. Normal range of motion.     Cervical back: Full passive range of motion without pain, normal range of motion and neck supple.     Right lower leg: No edema.     Left lower leg: No edema.  Skin:    General: Skin is warm and dry.     Capillary Refill: Capillary refill takes less than 2 seconds.  Neurological:     General: No focal deficit present.     Mental Status: She is alert and oriented to person, place, and time.     Cranial Nerves: No cranial nerve deficit.     Sensory: No sensory deficit.  Psychiatric:        Mood and Affect: Mood normal.        Behavior: Behavior normal.        Thought Content: Thought content normal.        Judgment: Judgment normal.         Assessment And Plan:     1. Encounter for general adult medical examination w/o abnormal findings . Behavior modifications discussed and diet history reviewed.   . Pt will continue to exercise regularly and modify diet with low GI, plant based foods and decrease intake of processed foods.  . Recommend intake of daily multivitamin, Vitamin D, and calcium.  . Recommend mammogram (scheduled for next month) for preventive screenings, as well as recommend immunizations that include influenza (will get at work), TDAP, she was given her pneumonia 23 in the office.  . Requested records from [facility] [place].  . Discussed results of her labs during office visit  2. Encounter for immunization - Pneumococcal polysaccharide vaccine 23-valent greater than or equal to 2yo subcutaneous/IM  3. Mixed hyperlipidemia  Chronic, controlled  Continue with current medications, no  issues with medications  Advised to increase intake of fiber and stay well hydrated - Pitavastatin Calcium (LIVALO) 4 MG TABS; Take 1 tablet (4 mg total) by mouth daily.  Dispense: 90 tablet; Refill: 1  4. Prediabetes  Chronic, stable  Continue with current medications  Encouraged to limit intake of sugary foods and drinks  Encouraged to increase physical activity to 150 minutes per week as tolerated - POCT Urinalysis Dipstick (81002) - POCT UA - Microalbumin - metFORMIN (GLUCOPHAGE-XR) 500 MG 24 hr tablet; Take 1 tablet (500 mg total) by mouth daily with breakfast.  Dispense: 90 tablet; Refill: 1  5. Excessive cerumen in right ear canal  We did not clean her ear out, will advise to use 1/2 water and 1/2 peroxide to ear daily until clear.    Patient was given opportunity to ask questions. Patient verbalized understanding of the plan and was able to repeat key elements of the plan. All questions were answered to their satisfaction.    Jeanell Sparrow, FNP, have reviewed all documentation for this visit. The documentation on 12/08/19 for the exam, diagnosis, procedures, and orders are all accurate and complete.  THE PATIENT IS ENCOURAGED TO PRACTICE SOCIAL DISTANCING DUE TO THE COVID-19 PANDEMIC.

## 2019-12-08 NOTE — Patient Instructions (Addendum)
Health Maintenance, Female Adopting a healthy lifestyle and getting preventive care are important in promoting health and wellness. Ask your health care provider about:  The right schedule for you to have regular tests and exams.  Things you can do on your own to prevent diseases and keep yourself healthy. What should I know about diet, weight, and exercise? Eat a healthy diet   Eat a diet that includes plenty of vegetables, fruits, low-fat dairy products, and lean protein.  Do not eat a lot of foods that are high in solid fats, added sugars, or sodium. Maintain a healthy weight Body mass index (BMI) is used to identify weight problems. It estimates body fat based on height and weight. Your health care provider can help determine your BMI and help you achieve or maintain a healthy weight. Get regular exercise Get regular exercise. This is one of the most important things you can do for your health. Most adults should:  Exercise for at least 150 minutes each week. The exercise should increase your heart rate and make you sweat (moderate-intensity exercise).  Do strengthening exercises at least twice a week. This is in addition to the moderate-intensity exercise.  Spend less time sitting. Even light physical activity can be beneficial. Watch cholesterol and blood lipids Have your blood tested for lipids and cholesterol at 69 years of age, then have this test every 5 years. Have your cholesterol levels checked more often if:  Your lipid or cholesterol levels are high.  You are older than 69 years of age.  You are at high risk for heart disease. What should I know about cancer screening? Depending on your health history and family history, you may need to have cancer screening at various ages. This may include screening for:  Breast cancer.  Cervical cancer.  Colorectal cancer.  Skin cancer.  Lung cancer. What should I know about heart disease, diabetes, and high blood  pressure? Blood pressure and heart disease  High blood pressure causes heart disease and increases the risk of stroke. This is more likely to develop in people who have high blood pressure readings, are of African descent, or are overweight.  Have your blood pressure checked: ? Every 3-5 years if you are 18-39 years of age. ? Every year if you are 40 years old or older. Diabetes Have regular diabetes screenings. This checks your fasting blood sugar level. Have the screening done:  Once every three years after age 40 if you are at a normal weight and have a low risk for diabetes.  More often and at a younger age if you are overweight or have a high risk for diabetes. What should I know about preventing infection? Hepatitis B If you have a higher risk for hepatitis B, you should be screened for this virus. Talk with your health care provider to find out if you are at risk for hepatitis B infection. Hepatitis C Testing is recommended for:  Everyone born from 1945 through 1965.  Anyone with known risk factors for hepatitis C. Sexually transmitted infections (STIs)  Get screened for STIs, including gonorrhea and chlamydia, if: ? You are sexually active and are younger than 69 years of age. ? You are older than 69 years of age and your health care provider tells you that you are at risk for this type of infection. ? Your sexual activity has changed since you were last screened, and you are at increased risk for chlamydia or gonorrhea. Ask your health care provider if   you are at risk.  Ask your health care provider about whether you are at high risk for HIV. Your health care provider may recommend a prescription medicine to help prevent HIV infection. If you choose to take medicine to prevent HIV, you should first get tested for HIV. You should then be tested every 3 months for as long as you are taking the medicine. Pregnancy  If you are about to stop having your period (premenopausal) and  you may become pregnant, seek counseling before you get pregnant.  Take 400 to 800 micrograms (mcg) of folic acid every day if you become pregnant.  Ask for birth control (contraception) if you want to prevent pregnancy. Osteoporosis and menopause Osteoporosis is a disease in which the bones lose minerals and strength with aging. This can result in bone fractures. If you are 49 years old or older, or if you are at risk for osteoporosis and fractures, ask your health care provider if you should:  Be screened for bone loss.  Take a calcium or vitamin D supplement to lower your risk of fractures.  Be given hormone replacement therapy (HRT) to treat symptoms of menopause. Follow these instructions at home: Lifestyle  Do not use any products that contain nicotine or tobacco, such as cigarettes, e-cigarettes, and chewing tobacco. If you need help quitting, ask your health care provider.  Do not use street drugs.  Do not share needles.  Ask your health care provider for help if you need support or information about quitting drugs. Alcohol use  Do not drink alcohol if: ? Your health care provider tells you not to drink. ? You are pregnant, may be pregnant, or are planning to become pregnant.  If you drink alcohol: ? Limit how much you use to 0-1 drink a day. ? Limit intake if you are breastfeeding.  Be aware of how much alcohol is in your drink. In the [place] one drink equals one 12 oz bottle of beer (355 mL), one 5 oz glass of wine (148 mL), or one 1 oz glass of hard liquor (44 mL). General instructions  Schedule regular health, dental, and eye exams.  Stay current with your vaccines.  Tell your health care provider if: ? You often feel depressed. ? You have ever been abused or do not feel safe at home. Summary  Adopting a healthy lifestyle and getting preventive care are important in promoting health and wellness.  Follow your health care provider's instructions about healthy  diet, exercising, and getting tested or screened for diseases.  Follow your health care provider's instructions on monitoring your cholesterol and blood pressure. This information is not intended to replace advice given to you by your health care provider. Make sure you discuss any questions you have with your health care provider. Document Revised: 03/20/2018 Document Reviewed: 03/20/2018 Elsevier Patient Education  2020 Elsevier Inc.  Take red yeast rice supplement daily

## 2019-12-10 DIAGNOSIS — Z124 Encounter for screening for malignant neoplasm of cervix: Secondary | ICD-10-CM | POA: Diagnosis not present

## 2019-12-10 DIAGNOSIS — N952 Postmenopausal atrophic vaginitis: Secondary | ICD-10-CM | POA: Diagnosis not present

## 2019-12-10 DIAGNOSIS — Z1239 Encounter for other screening for malignant neoplasm of breast: Secondary | ICD-10-CM | POA: Diagnosis not present

## 2019-12-10 DIAGNOSIS — R102 Pelvic and perineal pain: Secondary | ICD-10-CM | POA: Diagnosis not present

## 2019-12-10 DIAGNOSIS — M8589 Other specified disorders of bone density and structure, multiple sites: Secondary | ICD-10-CM | POA: Diagnosis not present

## 2019-12-10 DIAGNOSIS — Z1231 Encounter for screening mammogram for malignant neoplasm of breast: Secondary | ICD-10-CM | POA: Diagnosis not present

## 2019-12-10 DIAGNOSIS — Z13820 Encounter for screening for osteoporosis: Secondary | ICD-10-CM | POA: Diagnosis not present

## 2019-12-10 DIAGNOSIS — Z1211 Encounter for screening for malignant neoplasm of colon: Secondary | ICD-10-CM | POA: Diagnosis not present

## 2019-12-10 DIAGNOSIS — Z01419 Encounter for gynecological examination (general) (routine) without abnormal findings: Secondary | ICD-10-CM | POA: Diagnosis not present

## 2019-12-10 DIAGNOSIS — Z6821 Body mass index (BMI) 21.0-21.9, adult: Secondary | ICD-10-CM | POA: Diagnosis not present

## 2019-12-10 LAB — HM PAP SMEAR

## 2019-12-11 ENCOUNTER — Encounter: Payer: Self-pay | Admitting: Nurse Practitioner

## 2019-12-17 ENCOUNTER — Other Ambulatory Visit: Payer: Self-pay

## 2019-12-17 ENCOUNTER — Ambulatory Visit (INDEPENDENT_AMBULATORY_CARE_PROVIDER_SITE_OTHER): Payer: 59 | Admitting: Neurology

## 2019-12-17 ENCOUNTER — Encounter: Payer: Self-pay | Admitting: Neurology

## 2019-12-17 VITALS — BP 126/70 | HR 89 | Ht 60.0 in | Wt 111.5 lb

## 2019-12-17 DIAGNOSIS — G5132 Clonic hemifacial spasm, left: Secondary | ICD-10-CM | POA: Diagnosis not present

## 2019-12-17 NOTE — Progress Notes (Signed)
**  Botox 100 units x 1 vial, NDC 0165-5374-82, Lot [id number], Exp 02/2022, office supply.//mck,rn**

## 2019-12-18 DIAGNOSIS — G5132 Clonic hemifacial spasm, left: Secondary | ICD-10-CM

## 2019-12-18 MED ORDER — ONABOTULINUMTOXINA 100 UNITS IJ SOLR
100.0000 [IU] | Freq: Once | INTRAMUSCULAR | Status: AC
  Administered 2019-12-18: 100 [IU] via INTRAMUSCULAR

## 2019-12-18 NOTE — Progress Notes (Signed)
PATIENT: Jaime Rodriguez DOB: 08/11/1950  Chief Complaint  Patient presents with  . Hemifacial    Botox     HISTORICAL   RANIAH KARAN is a 69 y.o.  right-handed black female referred by Dr. Jannifer Franklin for EMG guided botulinumtoxin injection for left facial spasm  She had a history of left-sided hemifacial spasm that began approximately a year ago in 2013. The patient was seen by Dr. Melton Alar, and she underwent MRI evaluation of the brain with and without gadolinium that was unremarkable. The patient underwent Botox injections in 2013,   She did well with the Botox, and the benefit lasted almost one year. She did has mild left facial droop after the injection.  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   She denies any balance issues, speech problems or swallowing problems. The patient denies any change in vision. She denies a previous history of left Bell's palsy.  In recent 1 year, since 2014, She began to have urinary urgency, she never had children in the past she denies bilateral upper and lower extremity paresthesia   UPDATE March 7th 2016:  Last injection was in Feb 2015, very helpful, did very well, she noticed recurrence of her left facial twitching, almost constant, she hoped to continue EMG guided xeomin injection for her left facial spasm,   UPDATE April 28th 2016: Her insurance refused xeomin, approved for Botox A 100 unit  UPDATE May 24 2015:She responded very well to previous Botox injection, came in for repeat injection again  Update September 14 2015: When I met her initially in 2014, she complains of urinary urgency, she has never had a vaginal delivery, her urinary urgency has been fairly stable, she denies gait difficulty, denies bowel and bladder incontinence, denied lower  extremity paresthesia, she wants to initiate evaluation, she does has hyperreflexia on examinations,.  Update May 16, 2017:  Last injection was in June 2017, she responded very well, now with frequent left hemifacial spasm again,  UPDATE Aug 29 2017: She responded very well to previous injection in Feb 2019, no significant side effect noticed  UPDATE December 05 2017: She did weill with previous injection on Aug 29 2017.  UPDATE Aug 21 2018: She responded very well to previous injection, there was no significant side effect noted.  UPDATE November 28 2018: She did well to previous injection  UPDATE Mar 13 2019: She did well with previous injection  UPDATE June 12 2019: She responded well to previous injection  Update December 17, 2019: She responded well to previous injection,  On exam: She has frequent left hemifacial muscle spasm, involving left orbicularis oculi, orbicularis oris, mild left cheek puff weakness. She has mild  left eye closure weakness.    Assessment/Plan:  Left hemifacial spasm, responded very well to previous EMG guided botulism toxin injection,   EMG guided Botox a injection use 100 units, dissolvable to 2 cc of normal saline, used 55 units, discard 45 units  Left orbicularis oculi, injection was placed at 2,3,4,5 6, 8,10   oclock (2.5 x 7=17.5  units )  Right corrugate  5 units Left corrugate  5 units Procerus 5 units  Right frontalis 5 units Left frontalis 5 units  Left zygomatic major 2.5 units Left depressor labial inferioris 5 units   Right orbicularis oculi  at 4, 5,  (2.5x2=5)    Marcial Pacas, M.D. Ph.D.  [facility] [place], [place] [address] Phone: [phone] Fax:      [phone]

## 2019-12-25 MED FILL — metFORMIN HCL ER 500 MG TB2: 500 | 30 days supply | Qty: 30 | Fill #2

## 2019-12-30 DIAGNOSIS — M810 Age-related osteoporosis without current pathological fracture: Secondary | ICD-10-CM | POA: Diagnosis not present

## 2020-01-01 ENCOUNTER — Encounter: Payer: Self-pay | Admitting: Nurse Practitioner

## 2020-01-21 ENCOUNTER — Other Ambulatory Visit (HOSPITAL_COMMUNITY): Payer: Self-pay

## 2020-01-21 MED FILL — PREVIDENT 5000 BOOSTER PLUS: 1.1 | 25 days supply | Qty: 100 | Fill #1

## 2020-01-21 MED FILL — SODIUM FLUORIDE 0.2 % SOLN: 0.2 | 47 days supply | Qty: 473 | Fill #0

## 2020-01-22 ENCOUNTER — Other Ambulatory Visit: Payer: Self-pay

## 2020-01-22 DIAGNOSIS — R7303 Prediabetes: Secondary | ICD-10-CM

## 2020-01-22 MED ORDER — METFORMIN HCL ER 500 MG PO TB24: 500.0000 mg | ORAL_TABLET | Freq: Every day | ORAL | 1 refills | Status: DC

## 2020-01-22 MED FILL — metFORMIN HCL ER 500 MG TB2: 500 | 90 days supply | Qty: 90 | Fill #0

## 2020-02-19 ENCOUNTER — Other Ambulatory Visit: Payer: Self-pay

## 2020-02-19 DIAGNOSIS — E782 Mixed hyperlipidemia: Secondary | ICD-10-CM

## 2020-02-19 MED ORDER — LIVALO 4 MG PO TABS: 1.0000 | ORAL_TABLET | Freq: Every day | ORAL | 1 refills | Status: DC

## 2020-02-19 MED FILL — LIVALO 4 MG TABLET: 4 | 90 days supply | Qty: 90 | Fill #0

## 2020-04-26 MED FILL — metFORMIN HCL ER 500 MG TB2: 500 | 90 days supply | Qty: 90 | Fill #1

## 2020-04-26 MED FILL — SODIUM FLUORIDE 0.2 % SOLN: 0.2 | 47 days supply | Qty: 473 | Fill #0

## 2020-05-24 MED FILL — LIVALO 4 MG TABLET: 4 | 90 days supply | Qty: 90 | Fill #1

## 2020-05-26 ENCOUNTER — Encounter: Payer: Self-pay | Admitting: Nurse Practitioner

## 2020-05-27 ENCOUNTER — Other Ambulatory Visit: Payer: Self-pay

## 2020-05-27 DIAGNOSIS — E782 Mixed hyperlipidemia: Secondary | ICD-10-CM

## 2020-05-27 DIAGNOSIS — Z79899 Other long term (current) drug therapy: Secondary | ICD-10-CM

## 2020-05-27 DIAGNOSIS — R7303 Prediabetes: Secondary | ICD-10-CM

## 2020-05-28 ENCOUNTER — Other Ambulatory Visit: Payer: Self-pay | Admitting: Nurse Practitioner

## 2020-05-28 DIAGNOSIS — Z79899 Other long term (current) drug therapy: Secondary | ICD-10-CM | POA: Diagnosis not present

## 2020-05-28 DIAGNOSIS — R7303 Prediabetes: Secondary | ICD-10-CM | POA: Diagnosis not present

## 2020-05-28 DIAGNOSIS — H5213 Myopia, bilateral: Secondary | ICD-10-CM | POA: Diagnosis not present

## 2020-05-28 DIAGNOSIS — E782 Mixed hyperlipidemia: Secondary | ICD-10-CM | POA: Diagnosis not present

## 2020-05-29 LAB — CMP14+EGFR
ALT: 14 IU/L (ref 0–32)
AST: 21 IU/L (ref 0–40)
Albumin/Globulin Ratio: 1.6 (ref 1.2–2.2)
Albumin: 4.4 g/dL (ref 3.8–4.8)
Alkaline Phosphatase: 85 IU/L (ref 44–121)
BUN/Creatinine Ratio: 20 (ref 12–28)
BUN: 18 mg/dL (ref 8–27)
Bilirubin Total: <0.2 mg/dL (ref 0.0–1.2)
CO2: 22 mmol/L (ref 20–29)
Calcium: 9.4 mg/dL (ref 8.7–10.3)
Chloride: 103 mmol/L (ref 96–106)
Creatinine, Ser: 0.91 mg/dL (ref 0.57–1.00)
GFR calc Af Amer: 74 mL/min/{1.73_m2} (ref >59)
GFR calc non Af Amer: 65 mL/min/{1.73_m2} (ref >59)
Globulin, Total: 2.7 g/dL (ref 1.5–4.5)
Glucose: 94 mg/dL (ref 65–99)
Potassium: 4.5 mmol/L (ref 3.5–5.2)
Sodium: 140 mmol/L (ref 134–144)
Total Protein: 7.1 g/dL (ref 6.0–8.5)

## 2020-05-29 LAB — LIPID PANEL
Chol/HDL Ratio: 3 ratio (ref 0.0–4.4)
Cholesterol, Total: 235 mg/dL — ABNORMAL HIGH (ref 100–199)
HDL: 79 mg/dL (ref >39)
LDL Chol Calc (NIH): 148 mg/dL — ABNORMAL HIGH (ref 0–99)
Triglycerides: 46 mg/dL (ref 0–149)
VLDL Cholesterol Cal: 8 mg/dL (ref 5–40)

## 2020-05-29 LAB — HGB A1C W/O EAG: Hgb A1c MFr Bld: 6.3 % — ABNORMAL HIGH (ref 4.8–5.6)

## 2020-06-07 ENCOUNTER — Ambulatory Visit: Payer: 59 | Admitting: Nurse Practitioner

## 2020-06-07 ENCOUNTER — Encounter: Payer: Self-pay | Admitting: Nurse Practitioner

## 2020-06-07 ENCOUNTER — Other Ambulatory Visit: Payer: Self-pay

## 2020-06-07 VITALS — BP 116/72 | HR 79 | Temp 97.9°F | Ht 59.8 in | Wt 111.2 lb

## 2020-06-07 DIAGNOSIS — K5900 Constipation, unspecified: Secondary | ICD-10-CM

## 2020-06-07 DIAGNOSIS — R7303 Prediabetes: Secondary | ICD-10-CM

## 2020-06-07 DIAGNOSIS — E782 Mixed hyperlipidemia: Secondary | ICD-10-CM | POA: Diagnosis not present

## 2020-06-07 NOTE — Patient Instructions (Signed)
Prediabetes Eating Plan Prediabetes is a condition that causes blood sugar (glucose) levels to be higher than normal. This increases the risk for developing type 2 diabetes (type 2 diabetes mellitus). Working with a health care provider or nutrition specialist (dietitian) to make diet and lifestyle changes can help prevent the onset of diabetes. These changes may help you:  Control your blood glucose levels.  Improve your cholesterol levels.  Manage your blood pressure. What are tips for following this plan? Reading food labels  Read food labels to check the amount of fat, salt (sodium), and sugar in prepackaged foods. Avoid foods that have: ? Saturated fats. ? Trans fats. ? Added sugars.  Avoid foods that have more than 300 milligrams (mg) of sodium per serving. Limit your sodium intake to less than 2,300 mg each day. Shopping  Avoid buying pre-made and processed foods.  Avoid buying drinks with added sugar. Cooking  Cook with olive oil. Do not use butter, lard, or ghee.  Bake, broil, grill, steam, or boil foods. Avoid frying. Meal planning  Work with your dietitian to create an eating plan that is right for you. This may include tracking how many calories you take in each day. Use a food diary, notebook, or mobile application to track what you eat at each meal.  Consider following a Mediterranean diet. This includes: ? Eating several servings of fresh fruits and vegetables each day. ? Eating fish at least twice a week. ? Eating one serving each day of whole grains, beans, nuts, and seeds. ? Using olive oil instead of other fats. ? Limiting alcohol. ? Limiting red meat. ? Using nonfat or low-fat dairy products.  Consider following a plant-based diet. This includes dietary choices that focus on eating mostly vegetables and fruit, grains, beans, nuts, and seeds.  If you have high blood pressure, you may need to limit your sodium intake or follow a diet such as the DASH  (Dietary Approaches to Stop Hypertension) eating plan. The DASH diet aims to lower high blood pressure.   Lifestyle  Set weight loss goals with help from your health care team. It is recommended that most people with prediabetes lose 7% of their body weight.  Exercise for at least 30 minutes 5 or more days a week.  Attend a support group or seek support from a mental health counselor.  Take over-the-counter and prescription medicines only as told by your health care provider. What foods are recommended? Fruits Berries. Bananas. Apples. Oranges. Grapes. Papaya. Mango. Pomegranate. Kiwi. Grapefruit. Cherries. Vegetables Lettuce. Spinach. Peas. Beets. Cauliflower. Cabbage. Broccoli. Carrots. Tomatoes. Squash. Eggplant. Herbs. Peppers. Onions. Cucumbers. Brussels sprouts. Grains Whole grains, such as whole-wheat or whole-grain breads, crackers, cereals, and pasta. Unsweetened oatmeal. Bulgur. Barley. Quinoa. Brown rice. Corn or whole-wheat flour tortillas or taco shells. Meats and other proteins Seafood. Poultry without skin. Lean cuts of pork and beef. Tofu. Eggs. Nuts. Beans. Dairy Low-fat or fat-free dairy products, such as yogurt, cottage cheese, and cheese. Beverages Water. Tea. Coffee. Sugar-free or diet soda. Seltzer water. Low-fat or nonfat milk. Milk alternatives, such as soy or almond milk. Fats and oils Olive oil. Canola oil. Sunflower oil. Grapeseed oil. Avocado. Walnuts. Sweets and desserts Sugar-free or low-fat pudding. Sugar-free or low-fat ice cream and other frozen treats. Seasonings and condiments Herbs. Sodium-free spices. Mustard. Relish. Low-salt, low-sugar ketchup. Low-salt, low-sugar barbecue sauce. Low-fat or fat-free mayonnaise. The items listed above may not be a complete list of recommended foods and beverages. Contact a dietitian for more   information. What foods are not recommended? Fruits Fruits canned with syrup. Vegetables Canned vegetables. Frozen  vegetables with butter or cream sauce. Grains Refined white flour and flour products, such as bread, pasta, snack foods, and cereals. Meats and other proteins Fatty cuts of meat. Poultry with skin. Breaded or fried meat. Processed meats. Dairy Full-fat yogurt, cheese, or milk. Beverages Sweetened drinks, such as iced tea and soda. Fats and oils Butter. Lard. Ghee. Sweets and desserts Baked goods, such as cake, cupcakes, pastries, cookies, and cheesecake. Seasonings and condiments Spice mixes with added salt. Ketchup. Barbecue sauce. Mayonnaise. The items listed above may not be a complete list of foods and beverages that are not recommended. Contact a dietitian for more information. Where to find more information  American Diabetes Association: www.diabetes.org Summary  You may need to make diet and lifestyle changes to help prevent the onset of diabetes. These changes can help you control blood sugar, improve cholesterol levels, and manage blood pressure.  Set weight loss goals with help from your health care team. It is recommended that most people with prediabetes lose 7% of their body weight.  Consider following a Mediterranean diet. This includes eating plenty of fresh fruits and vegetables, whole grains, beans, nuts, seeds, fish, and low-fat dairy, and using olive oil instead of other fats. This information is not intended to replace advice given to you by your health care provider. Make sure you discuss any questions you have with your health care provider. Document Revised: 06/26/2019 Document Reviewed: 06/26/2019 Elsevier Patient Education  2021 Elsevier Inc.  

## 2020-06-07 NOTE — Progress Notes (Signed)
Tomasa Hose as a scribe for [name], FNP.,have documented all relevant documentation on the behalf of [name], FNP,as directed by  [name], FNP while in the presence of [name], FNP. This visit occurred during the SARS-CoV-2 public health emergency.  Safety protocols were in place, including screening questions prior to the visit, additional usage of staff PPE, and extensive cleaning of exam room while observing appropriate contact time as indicated for disinfecting solutions.  Subjective:     Patient ID: Jaime Rodriguez , female    DOB: 1950/12/25 , 70 y.o.   MRN: [id number]   Chief Complaint  Patient presents with  . Prediabetes  . Hyperlipidemia  . Referral    HPI  Pt here today for f/u on prediabetes and hyperlipidemia. She has no other concerns today. She has been taking Livalo 4mg  daily. She has been under more stress. Her sister passed away in Mar 21, 2023.  She is just started back exercising last week. She mostly eat baked foods.  She eats oatmeal and whole grain cereal.  She has tried metamucil but not effective with her having a bowel movement daily will sometimes go 3 days without a bowel movement. She drinks 3 bottles of water a day, maybe less at work.  She will eat eggs occasionally. She does eat a good amount of cheese.    Wt Readings from Last 3 Encounters: 06/07/20 : 111 lb 3.2 oz (50.4 kg) 12/17/19 : 111 lb 8 oz (50.6 kg) 12/08/19 : 112 lb 12.8 oz (51.2 kg)   Diabetes She presents for her follow-up diabetic visit. Diabetes type: prediabetes. Her disease course has been stable. There are no hypoglycemic associated symptoms. Pertinent negatives for hypoglycemia include no dizziness or headaches. There are no diabetic associated symptoms. Pertinent negatives for diabetes include no chest pain, no fatigue, no polydipsia, no polyphagia and no polyuria. There are no hypoglycemic complications. There are no diabetic complications. Risk factors for  coronary artery disease include sedentary lifestyle. Current diabetic treatment includes oral agent (monotherapy). Her weight is stable. She is following a generally healthy (she does eat a good amount of cheese) diet. When asked about meal planning, she reported none. She has not had a previous visit with a dietitian. She rarely participates in exercise. (Blood sugars averaging 80-90. ) Eye exam is not current.     Past Medical History:  Diagnosis Date  . Allergy   . Diabetes mellitus   . Fibrocystic disease of breast    LEFT BREAST SURGERY  . Hemifacial spasm 12/18/2012   Left lower face  . Hyperlipidemia   . Postmenopausal      Family History  Problem Relation Age of Onset  . Cancer Mother   . Other Mother        sepsis  . Stroke Father   . Prostate cancer Brother   . Cancer Maternal Grandmother   . Asthma Sister   . Hyperlipidemia Sister   . Diabetes Sister   . Cancer Brother   . Hypertension Sister   . Anxiety disorder Other   . Diabetes Other   . Hyperlipidemia Other      Current Outpatient Medications:  .  aspirin 81 MG tablet, Take 81 mg by mouth daily., Disp: , Rfl:  .  botulinum toxin Type A (BOTOX) 100 units SOLR injection, Inject 100 Units into the muscle every 3 (three) months., Disp: , Rfl:  .  diclofenac sodium (VOLTAREN) 1 % GEL, Apply 2 g topically 4 (four) times  daily as needed., Disp: 1 Tube, Rfl: 3 .  FREESTYLE LITE test strip, USE TWICE A DAY BEFORE BREAKFAST & DINNER AS DIRECTED, Disp: 100 strip, Rfl: 12 .  Lancets (FREESTYLE) lancets, USE TWICE A DAY BEFORE BREAKFAST & DINNER AS DIRECTED, Disp: 100 each, Rfl: 12 .  loratadine (CLARITIN) 10 MG tablet, Take 10 mg by mouth daily., Disp: , Rfl:  .  metFORMIN (GLUCOPHAGE-XR) 500 MG 24 hr tablet, Take 1 tablet (500 mg total) by mouth daily with breakfast., Disp: 90 tablet, Rfl: 1 .  Pitavastatin Calcium (LIVALO) 4 MG TABS, Take 1 tablet (4 mg total) by mouth daily., Disp: 90 tablet, Rfl: 1 .  Sodium Fluoride  (FLUORIDE PO), Take by mouth. 0.2 Rinse, Disp: , Rfl:  .  Sodium Fluoride (PREVIDENT 5000 BOOSTER DT), Place onto teeth. Toothpaste, Disp: , Rfl:    Allergies  Allergen Reactions  . Penicillins      Review of Systems  Constitutional: Negative.  Negative for fatigue.  HENT: Negative.   Respiratory: Negative.   Cardiovascular: Negative.  Negative for chest pain, palpitations and leg swelling.  Endocrine: Negative for polydipsia, polyphagia and polyuria.  Musculoskeletal: Negative.   Skin: Negative.   Neurological: Negative for dizziness and headaches.  Psychiatric/Behavioral: Negative.      Today's Vitals   06/07/20 1417  BP: 116/72  Pulse: 79  Temp: 97.9 F (36.6 C)  TempSrc: Oral  Weight: 111 lb 3.2 oz (50.4 kg)  Height: 4' 11.8" (1.519 m)   Body mass index is 21.86 kg/m.  Wt Readings from Last 3 Encounters:  06/07/20 111 lb 3.2 oz (50.4 kg)  12/17/19 111 lb 8 oz (50.6 kg)  12/08/19 112 lb 12.8 oz (51.2 kg)   Objective:  Physical Exam Vitals reviewed.  Constitutional:      General: She is not in acute distress.    Appearance: Normal appearance. She is obese.  Eyes:     Extraocular Movements: Extraocular movements intact.     Conjunctiva/sclera: Conjunctivae normal.     Pupils: Pupils are equal, round, and reactive to light.  Cardiovascular:     Rate and Rhythm: Normal rate and regular rhythm.     Pulses: Normal pulses.     Heart sounds: Normal heart sounds. No murmur heard.   Pulmonary:     Effort: Pulmonary effort is normal. No respiratory distress.     Breath sounds: Normal breath sounds. No wheezing.  Skin:    General: Skin is warm and dry.     Capillary Refill: Capillary refill takes less than 2 seconds.  Neurological:     General: No focal deficit present.     Mental Status: She is alert and oriented to person, place, and time.     Cranial Nerves: No cranial nerve deficit.  Psychiatric:        Mood and Affect: Mood normal.        Behavior:  Behavior normal.        Thought Content: Thought content normal.        Judgment: Judgment normal.         Assessment And Plan:     1. Mixed hyperlipidemia  Her cholesterol levels are slightly elevated.   2. Prediabetes  She is going to try glucose control supplement daily  Labs were discussed during office visit and her HgbA1c is slightly elevated  She is to continue with metformin XR  3. Constipation, unspecified constipation type  Given fiber supplements to try  Increase water intake to  at least 64 oz.     Patient was given opportunity to ask questions. Patient verbalized understanding of the plan and was able to repeat key elements of the plan. All questions were answered to their satisfaction.  [name], FNP    I, [name], FNP, have reviewed all documentation for this visit. The documentation on 06/07/20 for the exam, diagnosis, procedures, and orders are all accurate and complete.   THE PATIENT IS ENCOURAGED TO PRACTICE SOCIAL DISTANCING DUE TO THE COVID-19 PANDEMIC.

## 2020-06-29 ENCOUNTER — Other Ambulatory Visit (HOSPITAL_BASED_OUTPATIENT_CLINIC_OR_DEPARTMENT_OTHER): Payer: Self-pay

## 2020-07-12 ENCOUNTER — Other Ambulatory Visit (HOSPITAL_COMMUNITY): Payer: Self-pay

## 2020-07-14 ENCOUNTER — Encounter: Payer: Self-pay | Admitting: Neurology

## 2020-07-14 ENCOUNTER — Ambulatory Visit: Payer: 59 | Admitting: Neurology

## 2020-07-14 VITALS — BP 110/62 | HR 95 | Ht 60.0 in | Wt 110.0 lb

## 2020-07-14 DIAGNOSIS — G5132 Clonic hemifacial spasm, left: Secondary | ICD-10-CM | POA: Diagnosis not present

## 2020-07-14 NOTE — Progress Notes (Signed)
PATIENT: Jaime Rodriguez DOB: Dec 15, 1950  Chief Complaint  Patient presents with  . Procedure    Botox     HISTORICAL   Jaime Rodriguez is a 70 y.o.  right-handed black female referred by Dr. Jannifer Franklin for EMG guided botulinumtoxin injection for left facial spasm  She had a history of left-sided hemifacial spasm that began approximately a year ago in 2013. The patient was seen by Dr. Melton Alar, and she underwent MRI evaluation of the brain with and without gadolinium that was unremarkable. The patient underwent Botox injections in 2013,   She did well with the Botox, and the benefit lasted almost one year. She did has mild left facial droop after the injection.  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   She denies any balance issues, speech problems or swallowing problems. The patient denies any change in vision. She denies a previous history of left Bell's palsy.  In recent 1 year, since 2014, She began to have urinary urgency, she never had children in the past she denies bilateral upper and lower extremity paresthesia   UPDATE March 7th 2016:  Last injection was in Feb 2015, very helpful, did very well, she noticed recurrence of her left facial twitching, almost constant, she hoped to continue EMG guided xeomin injection for her left facial spasm,   UPDATE April 28th 2016: Her insurance refused xeomin, approved for Botox A 100 unit  UPDATE May 24 2015:She responded very well to previous Botox injection, came in for repeat injection again  Update September 14 2015: When I met her initially in 2014, she complains of urinary urgency, she has never had a vaginal delivery, her urinary urgency has been fairly stable, she denies gait difficulty, denies bowel and bladder incontinence, denied lower  extremity paresthesia, she wants to initiate evaluation, she does has hyperreflexia on examinations,.  Update May 16, 2017:  Last injection was in June 2017, she responded very well, now with frequent left hemifacial spasm again,  UPDATE Aug 29 2017: She responded very well to previous injection in Feb 2019, no significant side effect noticed  UPDATE December 05 2017: She did weill with previous injection on Aug 29 2017.  UPDATE Aug 21 2018: She responded very well to previous injection, there was no significant side effect noted.  UPDATE November 28 2018: She did well to previous injection  UPDATE Mar 13 2019: She did well with previous injection  UPDATE June 12 2019: She responded well to previous injection  Update December 17, 2019: She responded well to previous injection,  Update July 14, 2020: She responded well to previous injection, has much prolonged interval in between  On exam: She has occasionally left hemifacial muscle spasm, involving left orbicularis oculi, orbicularis oris, mild left cheek puff weakness. She has mild  left eye closure weakness.    Assessment/Plan:  Left hemifacial spasm, responded very well to previous EMG guided botulism toxin injection,   EMG guided Botox a injection use 100 units, dissolvable to 2 cc of normal saline, used 55 units, discard 45 units  Left orbicularis oculi, injection was placed at 2,3,4,5 6, 8,10   oclock (2.5 x 7=17.5  units )  Right corrugate  5 units Left corrugate  5 units Procerus 5 units  Right frontalis 5 units Left frontalis 5 units  Left zygomatic major 2.5 units Left depressor labial inferioris 5 units   Right orbicularis oculi at 4, 5,  (2.5x2=5) Botox    Marcial Pacas, M.D. Ph.D.  [facility] [place], [place] [address] Phone: [phone] Fax:      [phone]

## 2020-07-14 NOTE — Progress Notes (Signed)
**  Botox 100 units x 1 vial, NDC 7092-9574-73, Lot [id number], Exp 08/2022, office supply.//mck,rn**

## 2020-07-15 MED ORDER — ONABOTULINUMTOXINA 100 UNITS IJ SOLR
100.0000 [IU] | Freq: Once | INTRAMUSCULAR | Status: AC
  Administered 2020-07-15: 100 [IU] via INTRAMUSCULAR

## 2020-07-20 ENCOUNTER — Other Ambulatory Visit (HOSPITAL_COMMUNITY): Payer: Self-pay

## 2020-07-20 MED ORDER — METFORMIN HCL ER 500 MG PO TB24
ORAL_TABLET | ORAL | 1 refills | Status: DC
  Filled 2020-07-20: qty 90, 90d supply, fill #0
  Filled 2020-10-21: qty 90, 90d supply, fill #1

## 2020-07-22 ENCOUNTER — Other Ambulatory Visit (HOSPITAL_COMMUNITY): Payer: Self-pay

## 2020-08-26 ENCOUNTER — Other Ambulatory Visit (HOSPITAL_COMMUNITY): Payer: Self-pay

## 2020-08-26 ENCOUNTER — Other Ambulatory Visit: Payer: Self-pay | Admitting: Nurse Practitioner

## 2020-08-26 DIAGNOSIS — E782 Mixed hyperlipidemia: Secondary | ICD-10-CM

## 2020-08-26 MED ORDER — LIVALO 4 MG PO TABS
1.0000 | ORAL_TABLET | Freq: Every day | ORAL | 1 refills | Status: DC
  Filled 2020-08-26: qty 90, 90d supply, fill #0
  Filled 2020-11-25: qty 90, 90d supply, fill #1

## 2020-08-30 ENCOUNTER — Encounter: Payer: Self-pay | Admitting: Nurse Practitioner

## 2020-08-30 ENCOUNTER — Ambulatory Visit: Payer: 59 | Admitting: Nurse Practitioner

## 2020-08-30 ENCOUNTER — Other Ambulatory Visit: Payer: Self-pay

## 2020-08-30 VITALS — BP 102/60 | HR 80 | Temp 97.8°F | Ht 60.0 in | Wt 109.2 lb

## 2020-08-30 DIAGNOSIS — R7303 Prediabetes: Secondary | ICD-10-CM

## 2020-08-30 DIAGNOSIS — E782 Mixed hyperlipidemia: Secondary | ICD-10-CM

## 2020-08-30 NOTE — Progress Notes (Signed)
I,Jaime Rodriguez as a Neurosurgeon for [employer], FNP.,have documented all relevant documentation on the behalf of Jaime Felts, FNP,as directed by  Jaime Felts, FNP while in the presence of Jaime Felts, FNP. This visit occurred during the SARS-CoV-2 public health emergency.  Safety protocols were in place, including screening questions prior to the visit, additional usage of staff PPE, and extensive cleaning of exam room while observing appropriate contact time as indicated for disinfecting solutions.  Subjective:     Patient ID: Jaime Rodriguez , female    DOB: May 11, 1950 , 70 y.o.   MRN: [id number]   Chief Complaint  Patient presents with  . Prediabetes  . Hyperlipidemia    HPI  Pt here today for f/u on prediabetes and hyperlipidemia. She is taking glucose control.  She is not taking red yeast rice.    Hyperlipidemia This is a chronic problem. The problem is controlled. She has no history of chronic renal disease. Pertinent negatives include no chest pain. Current antihyperlipidemic treatment includes herbal therapy. Risk factors for coronary artery disease include a sedentary lifestyle and dyslipidemia.  Diabetes She presents for her follow-up diabetic visit. Diabetes type: prediabetes. Her disease course has been stable. There are no hypoglycemic associated symptoms. Pertinent negatives for hypoglycemia include no dizziness or headaches. There are no diabetic associated symptoms. Pertinent negatives for diabetes include no chest pain, no fatigue, no polydipsia, no polyphagia and no polyuria. There are no hypoglycemic complications. There are no diabetic complications. Risk factors for coronary artery disease include sedentary lifestyle. Current diabetic treatment includes oral agent (monotherapy). Her weight is stable. She is following a generally healthy (she does eat a good amount of cheese) diet. When asked about meal planning, she reported none. She has not had a previous  visit with a dietitian. She rarely participates in exercise. (Blood sugars averaging 80ss) She does not see a podiatrist.Eye exam is not current.     Past Medical History:  Diagnosis Date  . Allergy   . Diabetes mellitus   . Fibrocystic disease of breast    LEFT BREAST SURGERY  . Hemifacial spasm 12/18/2012   Left lower face  . Hyperlipidemia   . Postmenopausal      Family History  Problem Relation Age of Onset  . Cancer Mother   . Other Mother        sepsis  . Stroke Father   . Prostate cancer Brother   . Cancer Maternal Grandmother   . Asthma Sister   . Hyperlipidemia Sister   . Diabetes Sister   . Cancer Brother   . Hypertension Sister   . Anxiety disorder Other   . Diabetes Other   . Hyperlipidemia Other      Current Outpatient Medications:  .  aspirin 81 MG tablet, Take 81 mg by mouth daily., Disp: , Rfl:  .  botulinum toxin Type A (BOTOX) 100 units SOLR injection, Inject 100 Units into the muscle every 3 (three) months., Disp: , Rfl:  .  diclofenac sodium (VOLTAREN) 1 % GEL, Apply 2 g topically 4 (four) times daily as needed., Disp: 1 Tube, Rfl: 3 .  FREESTYLE LITE test strip, USE TWICE A DAY BEFORE BREAKFAST & DINNER AS DIRECTED, Disp: 100 strip, Rfl: 12 .  Lancets (FREESTYLE) lancets, USE TWICE A DAY BEFORE BREAKFAST & DINNER AS DIRECTED, Disp: 100 each, Rfl: 12 .  loratadine (CLARITIN) 10 MG tablet, Take 10 mg by mouth daily., Disp: , Rfl:  .  metFORMIN (GLUCOPHAGE-XR) 500 MG  24 hr tablet, Take 1 tablet  by mouth daily with breakfast, Disp: 90 tablet, Rfl: 1 .  Pitavastatin Calcium (LIVALO) 4 MG TABS, Take 1 tablet (4 mg total) by mouth daily., Disp: 90 tablet, Rfl: 1 .  Sodium Fluoride (FLUORIDE PO), Take by mouth. 0.2 Rinse, Disp: , Rfl:  .  Sodium Fluoride (PREVIDENT 5000 BOOSTER DT), Place onto teeth. Toothpaste, Disp: , Rfl:  .  SODIUM FLUORIDE, DENTAL RINSE, 0.2 % SOLN, USE NIGHTLY AS DIRECTED, PREFERABLY BEFORE BEDTIME, SWISH WITH 10 ML FOR 30 SECONDS AND  SPIT OUT, DO NOT SWALLOW, Disp: 473 mL, Rfl: 0   Allergies  Allergen Reactions  . Penicillins      Review of Systems  Constitutional: Negative for fatigue.  Respiratory: Negative.   Cardiovascular: Negative for chest pain, palpitations and leg swelling.  Endocrine: Negative for polydipsia, polyphagia and polyuria.  Neurological: Negative for dizziness and headaches.  Psychiatric/Behavioral: Negative.      Today's Vitals   08/30/20 1413  BP: 102/60  Pulse: 80  Temp: 97.8 F (36.6 C)  Weight: 109 lb 3.2 oz (49.5 kg)  Height: 5' (1.524 m)  PainSc: 0-No pain   Body mass index is 21.33 kg/m.   Objective:  Physical Exam Vitals reviewed.  Constitutional:      General: She is not in acute distress.    Appearance: Normal appearance.  HENT:     Head: Normocephalic.  Eyes:     Extraocular Movements: Extraocular movements intact.     Conjunctiva/sclera: Conjunctivae normal.     Pupils: Pupils are equal, round, and reactive to light.  Cardiovascular:     Rate and Rhythm: Normal rate and regular rhythm.     Pulses: Normal pulses.     Heart sounds: Normal heart sounds. No murmur heard.   Pulmonary:     Effort: Pulmonary effort is normal. No respiratory distress.     Breath sounds: Normal breath sounds. No wheezing.  Skin:    General: Skin is warm and dry.     Capillary Refill: Capillary refill takes less than 2 seconds.  Neurological:     General: No focal deficit present.     Mental Status: She is alert and oriented to person, place, and time.     Cranial Nerves: No cranial nerve deficit.  Psychiatric:        Mood and Affect: Mood normal.        Behavior: Behavior normal.        Thought Content: Thought content normal.        Judgment: Judgment normal.         Assessment And Plan:     1. Prediabetes   Chronic, slightly elevated at last visit  Continue with current medications  Encouraged to limit intake of sugary foods and drinks  Encouraged to increase  physical activity to 150 minutes per week - Hemoglobin A1c; Future  2. Mixed hyperlipidemia   Chronic, controlled  Continue with current medications, continue with supplement pending labs will make changes.  - Lipid panel; Future     Patient was given opportunity to ask questions. Patient verbalized understanding of the plan and was able to repeat key elements of the plan. All questions were answered to their satisfaction.  Jaime Felts, FNP   I, Jaime Felts, FNP, have reviewed all documentation for this visit. The documentation on 08/30/20 for the exam, diagnosis, procedures, and orders are all accurate and complete.   IF YOU HAVE BEEN REFERRED TO A SPECIALIST, IT  MAY TAKE 1-2 WEEKS TO SCHEDULE/PROCESS THE REFERRAL. IF YOU HAVE NOT HEARD FROM US/SPECIALIST IN TWO WEEKS, PLEASE GIVE US A CALL AT [phone].   THE PATIENT IS ENCOURAGED TO PRACTICE SOCIAL DISTANCING DUE TO THE COVID-19 PANDEMIC.   

## 2020-08-31 DIAGNOSIS — E782 Mixed hyperlipidemia: Secondary | ICD-10-CM | POA: Diagnosis not present

## 2020-08-31 LAB — LIPID PANEL
Chol/HDL Ratio: 2.8 ratio (ref 0.0–4.4)
Cholesterol, Total: 217 mg/dL — ABNORMAL HIGH (ref 100–199)
HDL: 77 mg/dL (ref >39)
LDL Chol Calc (NIH): 131 mg/dL — ABNORMAL HIGH (ref 0–99)
Triglycerides: 53 mg/dL (ref 0–149)
VLDL Cholesterol Cal: 9 mg/dL (ref 5–40)

## 2020-08-31 LAB — HEMOGLOBIN A1C
Est. average glucose Bld gHb Est-mCnc: 128 mg/dL
Hgb A1c MFr Bld: 6.1 % — ABNORMAL HIGH (ref 4.8–5.6)

## 2020-09-16 ENCOUNTER — Other Ambulatory Visit (HOSPITAL_COMMUNITY): Payer: Self-pay

## 2020-09-16 MED ORDER — SODIUM FLUORIDE 0.2 % MT SOLN
OROMUCOSAL | 5 refills | Status: DC
  Filled 2020-09-16: qty 473, 30d supply, fill #0

## 2020-09-24 DIAGNOSIS — L84 Corns and callosities: Secondary | ICD-10-CM | POA: Diagnosis not present

## 2020-09-24 DIAGNOSIS — E119 Type 2 diabetes mellitus without complications: Secondary | ICD-10-CM | POA: Diagnosis not present

## 2020-10-15 ENCOUNTER — Other Ambulatory Visit (HOSPITAL_COMMUNITY): Payer: Self-pay

## 2020-10-20 ENCOUNTER — Ambulatory Visit: Payer: 59 | Admitting: Neurology

## 2020-10-21 ENCOUNTER — Other Ambulatory Visit (HOSPITAL_COMMUNITY): Payer: Self-pay

## 2020-10-27 ENCOUNTER — Telehealth: Payer: Self-pay | Admitting: Neurology

## 2020-10-27 ENCOUNTER — Encounter: Payer: Self-pay | Admitting: Neurology

## 2020-10-27 ENCOUNTER — Ambulatory Visit: Payer: 59 | Admitting: Neurology

## 2020-10-27 VITALS — BP 126/77 | HR 77 | Ht 61.0 in | Wt 108.0 lb

## 2020-10-27 DIAGNOSIS — G5132 Clonic hemifacial spasm, left: Secondary | ICD-10-CM | POA: Diagnosis not present

## 2020-10-27 NOTE — Progress Notes (Signed)
PATIENT: Jaime Rodriguez DOB: 04/03/1951  Chief Complaint  Patient presents with   Procedure     HISTORICAL   Jaime Rodriguez is a 70 y.o.  right-handed black female referred by Dr. Anne Rodriguez for EMG guided botulinumtoxin injection for left facial spasm   She had a history of left-sided hemifacial spasm that began approximately a year ago in 2013. The patient was seen by Dr. Vela Rodriguez, and she underwent MRI evaluation of the brain with and without gadolinium that was unremarkable. The patient underwent Botox injections in 2013,   She did well with the Botox, and the benefit lasted almost one year. She did has mild left facial droop after the injection.   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    She denies any balance issues, speech problems or swallowing problems. The patient denies any change in vision. She denies a previous history of left Bell's palsy.   In recent 1 year, since 2014, She began to have urinary urgency, she never had children in the past she denies bilateral upper and lower extremity paresthesia    UPDATE March 7th 2016:   Last injection was in Feb 2015, very helpful, did very well, she noticed recurrence of her left facial twitching, almost constant, she hoped to continue EMG guided xeomin injection for her left facial spasm,    UPDATE April 28th 2016: Her insurance refused xeomin, approved for Botox A 100 unit   UPDATE May 24 2015:She responded very well to previous Botox injection, came in for repeat injection again   Update September 14 2015: When I met her initially in 2014, she complains of urinary urgency, she has never had a vaginal delivery, her urinary urgency has been fairly stable, she denies gait difficulty, denies bowel and bladder incontinence, denied lower extremity  paresthesia, she wants to initiate evaluation, she does has hyperreflexia on examinations,.   Update May 16, 2017:   Last injection was in June 2017, she responded very well, now with frequent left hemifacial spasm again,   UPDATE Aug 29 2017: She responded very well to previous injection in Feb 2019, no significant side effect noticed   UPDATE December 05 2017: She did weill with previous injection on Aug 29 2017.  UPDATE Aug 21 2018: She responded very well to previous injection, there was no significant side effect noted.  UPDATE November 28 2018: She did well to previous injection   UPDATE Mar 13 2019: She did well with previous injection  UPDATE June 12 2019: She responded well to previous injection  Update December 17, 2019: She responded well to previous injection,  Update July 14, 2020: She responded well to previous injection, has much prolonged interval in between  UPDATE October 27 2020: She did notice return of left facial twitching at the end of 3 months  On exam: She has occasionally left hemifacial muscle spasm, involving left orbicularis oculi, orbicularis oris, mild left cheek puff weakness. She has mild  left eye closure weakness.     Assessment/Plan:   Left hemifacial spasm, responded very well to previous EMG guided botulism toxin injection,    EMG guided Botox a injection use 100 units, dissolvable to 2 cc of normal saline, used 52.5 units, discard 47.5 units   Left orbicularis oculi, injection was placed at 2,3,4,5 6,  8,10   oclock (2.5 x 7=17.5  units )   Right corrugate  5 units Left corrugate  5 units Procerus 5 units  Right frontalis 5 units Left frontalis 5 units   Left zygomatic major 2.5 units    Right orbicularis oculi at 3,4, 5,  (2.5x3=7.5) Botox       Jaime Rodriguez, M.D. Ph.D.   [facility] [place], [place] [address] Phone: [phone] Fax:      [phone]

## 2020-10-27 NOTE — Progress Notes (Signed)
Botox- 100 units x 1 vial Lot: [id number] Expiration: 12/2021 NDC: 4562-5638-93      B/B OFFICE SUPPLY

## 2020-10-27 NOTE — Telephone Encounter (Signed)
I called [employer] to check PA requirements for Botox. Patient will be coming into the office for an injection today. I spoke with Dorene Sorrow to check CPT codes [phone] and [phone], she states no authorization is required. Reference #[id number].

## 2020-10-28 DIAGNOSIS — G5132 Clonic hemifacial spasm, left: Secondary | ICD-10-CM

## 2020-10-28 MED ORDER — ONABOTULINUMTOXINA 100 UNITS IJ SOLR
100.0000 [IU] | Freq: Once | INTRAMUSCULAR | Status: AC
  Administered 2020-10-28: 100 [IU] via INTRAMUSCULAR

## 2020-11-25 ENCOUNTER — Other Ambulatory Visit (HOSPITAL_COMMUNITY): Payer: Self-pay

## 2020-11-30 ENCOUNTER — Other Ambulatory Visit: Payer: Self-pay

## 2020-11-30 ENCOUNTER — Encounter: Payer: Self-pay | Admitting: Nurse Practitioner

## 2020-11-30 DIAGNOSIS — R7303 Prediabetes: Secondary | ICD-10-CM

## 2020-11-30 DIAGNOSIS — Z79899 Other long term (current) drug therapy: Secondary | ICD-10-CM

## 2020-12-03 DIAGNOSIS — Z79899 Other long term (current) drug therapy: Secondary | ICD-10-CM | POA: Diagnosis not present

## 2020-12-03 DIAGNOSIS — R7303 Prediabetes: Secondary | ICD-10-CM | POA: Diagnosis not present

## 2020-12-03 DIAGNOSIS — L84 Corns and callosities: Secondary | ICD-10-CM | POA: Diagnosis not present

## 2020-12-03 DIAGNOSIS — E119 Type 2 diabetes mellitus without complications: Secondary | ICD-10-CM | POA: Diagnosis not present

## 2020-12-04 LAB — CMP14+EGFR
ALT: 13 IU/L (ref 0–32)
AST: 19 IU/L (ref 0–40)
Albumin/Globulin Ratio: 1.7 (ref 1.2–2.2)
Albumin: 4.4 g/dL (ref 3.8–4.8)
Alkaline Phosphatase: 91 IU/L (ref 44–121)
BUN/Creatinine Ratio: 20 (ref 12–28)
BUN: 18 mg/dL (ref 8–27)
Bilirubin Total: <0.2 mg/dL (ref 0.0–1.2)
CO2: 24 mmol/L (ref 20–29)
Calcium: 9.3 mg/dL (ref 8.7–10.3)
Chloride: 103 mmol/L (ref 96–106)
Creatinine, Ser: 0.9 mg/dL (ref 0.57–1.00)
Globulin, Total: 2.6 g/dL (ref 1.5–4.5)
Glucose: 82 mg/dL (ref 65–99)
Potassium: 4.4 mmol/L (ref 3.5–5.2)
Sodium: 141 mmol/L (ref 134–144)
Total Protein: 7 g/dL (ref 6.0–8.5)
eGFR: 69 mL/min/{1.73_m2} (ref >59)

## 2020-12-04 LAB — CBC WITH DIFFERENTIAL/PLATELET
Basophils Absolute: 0 10*3/uL (ref 0.0–0.2)
Basos: 1 %
EOS (ABSOLUTE): 0.1 10*3/uL (ref 0.0–0.4)
Eos: 2 %
Hematocrit: 36.1 % (ref 34.0–46.6)
Hemoglobin: 11.5 g/dL (ref 11.1–15.9)
Immature Grans (Abs): 0 10*3/uL (ref 0.0–0.1)
Immature Granulocytes: 0 %
Lymphocytes Absolute: 1.1 10*3/uL (ref 0.7–3.1)
Lymphs: 31 %
MCH: 30.3 pg (ref 26.6–33.0)
MCHC: 31.9 g/dL (ref 31.5–35.7)
MCV: 95 fL (ref 79–97)
Monocytes Absolute: 0.4 10*3/uL (ref 0.1–0.9)
Monocytes: 11 %
Neutrophils Absolute: 2 10*3/uL (ref 1.4–7.0)
Neutrophils: 55 %
Platelets: 295 10*3/uL (ref 150–450)
RBC: 3.8 x10E6/uL (ref 3.77–5.28)
RDW: 11.8 % (ref 11.7–15.4)
WBC: 3.6 10*3/uL (ref 3.4–10.8)

## 2020-12-04 LAB — HEMOGLOBIN A1C
Est. average glucose Bld gHb Est-mCnc: 134 mg/dL
Hgb A1c MFr Bld: 6.3 % — ABNORMAL HIGH (ref 4.8–5.6)

## 2020-12-08 ENCOUNTER — Ambulatory Visit (INDEPENDENT_AMBULATORY_CARE_PROVIDER_SITE_OTHER): Payer: 59 | Admitting: Nurse Practitioner

## 2020-12-08 ENCOUNTER — Encounter: Payer: Self-pay | Admitting: Nurse Practitioner

## 2020-12-08 ENCOUNTER — Other Ambulatory Visit: Payer: Self-pay

## 2020-12-08 ENCOUNTER — Other Ambulatory Visit (HOSPITAL_COMMUNITY): Payer: Self-pay

## 2020-12-08 VITALS — BP 116/78 | HR 89 | Temp 98.1°F | Ht 61.0 in | Wt 105.4 lb

## 2020-12-08 DIAGNOSIS — R7303 Prediabetes: Secondary | ICD-10-CM

## 2020-12-08 DIAGNOSIS — Z0000 Encounter for general adult medical examination without abnormal findings: Secondary | ICD-10-CM

## 2020-12-08 DIAGNOSIS — E782 Mixed hyperlipidemia: Secondary | ICD-10-CM | POA: Diagnosis not present

## 2020-12-08 DIAGNOSIS — H6121 Impacted cerumen, right ear: Secondary | ICD-10-CM | POA: Diagnosis not present

## 2020-12-08 MED ORDER — METFORMIN HCL ER 750 MG PO TB24
750.0000 mg | ORAL_TABLET | Freq: Every day | ORAL | 1 refills | Status: DC
Start: 2020-12-08 — End: 2021-06-14
  Filled 2020-12-08: qty 90, 90d supply, fill #0
  Filled 2021-03-16: qty 90, 90d supply, fill #1

## 2020-12-08 NOTE — Patient Instructions (Addendum)
Health Maintenance, Female Adopting a healthy lifestyle and getting preventive care are important in promoting health and wellness. Ask your health care provider about: The right schedule for you to have regular tests and exams. Things you can do on your own to prevent diseases and keep yourself healthy. What should I know about diet, weight, and exercise? Eat a healthy diet  Eat a diet that includes plenty of vegetables, fruits, low-fat dairy products, and lean protein. Do not eat a lot of foods that are high in solid fats, added sugars, or sodium. Maintain a healthy weight Body mass index (BMI) is used to identify weight problems. It estimates body fat based on height and weight. Your health care provider can help determine your BMI and help you achieve or maintain a healthy weight. Get regular exercise Get regular exercise. This is one of the most important things you can do for your health. Most adults should: Exercise for at least 150 minutes each week. The exercise should increase your heart rate and make you sweat (moderate-intensity exercise). Do strengthening exercises at least twice a week. This is in addition to the moderate-intensity exercise. Spend less time sitting. Even light physical activity can be beneficial. Watch cholesterol and blood lipids Have your blood tested for lipids and cholesterol at 70 years of age, then have this test every 5 years. Have your cholesterol levels checked more often if: Your lipid or cholesterol levels are high. You are older than 70 years of age. You are at high risk for heart disease. What should I know about cancer screening? Depending on your health history and family history, you may need to have cancer screening at various ages. This may include screening for: Breast cancer. Cervical cancer. Colorectal cancer. Skin cancer. Lung cancer. What should I know about heart disease, diabetes, and high blood pressure? Blood pressure and heart  disease High blood pressure causes heart disease and increases the risk of stroke. This is more likely to develop in people who have high blood pressure readings, are of African descent, or are overweight. Have your blood pressure checked: Every 3-5 years if you are 18-39 years of age. Every year if you are 40 years old or older. Diabetes Have regular diabetes screenings. This checks your fasting blood sugar level. Have the screening done: Once every three years after age 40 if you are at a normal weight and have a low risk for diabetes. More often and at a younger age if you are overweight or have a high risk for diabetes. What should I know about preventing infection? Hepatitis B If you have a higher risk for hepatitis B, you should be screened for this virus. Talk with your health care provider to find out if you are at risk for hepatitis B infection. Hepatitis C Testing is recommended for: Everyone born from 1945 through 1965. Anyone with known risk factors for hepatitis C. Sexually transmitted infections (STIs) Get screened for STIs, including gonorrhea and chlamydia, if: You are sexually active and are younger than 70 years of age. You are older than 70 years of age and your health care provider tells you that you are at risk for this type of infection. Your sexual activity has changed since you were last screened, and you are at increased risk for chlamydia or gonorrhea. Ask your health care provider if you are at risk. Ask your health care provider about whether you are at high risk for HIV. Your health care provider may recommend a prescription medicine   to help prevent HIV infection. If you choose to take medicine to prevent HIV, you should first get tested for HIV. You should then be tested every 3 months for as long as you are taking the medicine. Pregnancy If you are about to stop having your period (premenopausal) and you may become pregnant, seek counseling before you get  pregnant. Take 400 to 800 micrograms (mcg) of folic acid every day if you become pregnant. Ask for birth control (contraception) if you want to prevent pregnancy. Osteoporosis and menopause Osteoporosis is a disease in which the bones lose minerals and strength with aging. This can result in bone fractures. If you are 65 years old or older, or if you are at risk for osteoporosis and fractures, ask your health care provider if you should: Be screened for bone loss. Take a calcium or vitamin D supplement to lower your risk of fractures. Be given hormone replacement therapy (HRT) to treat symptoms of menopause. Follow these instructions at home: Lifestyle Do not use any products that contain nicotine or tobacco, such as cigarettes, e-cigarettes, and chewing tobacco. If you need help quitting, ask your health care provider. Do not use street drugs. Do not share needles. Ask your health care provider for help if you need support or information about quitting drugs. Alcohol use Do not drink alcohol if: Your health care provider tells you not to drink. You are pregnant, may be pregnant, or are planning to become pregnant. If you drink alcohol: Limit how much you use to 0-1 drink a day. Limit intake if you are breastfeeding. Be aware of how much alcohol is in your drink. In the [place] one drink equals one 12 oz bottle of beer (355 mL), one 5 oz glass of wine (148 mL), or one 1 oz glass of hard liquor (44 mL). General instructions Schedule regular health, dental, and eye exams. Stay current with your vaccines. Tell your health care provider if: You often feel depressed. You have ever been abused or do not feel safe at home. Summary Adopting a healthy lifestyle and getting preventive care are important in promoting health and wellness. Follow your health care provider's instructions about healthy diet, exercising, and getting tested or screened for diseases. Follow your health care provider's  instructions on monitoring your cholesterol and blood pressure. This information is not intended to replace advice given to you by your health care provider. Make sure you discuss any questions you have with your health care provider. Document Revised: 06/04/2020 Document Reviewed: 03/20/2018 Elsevier Patient Education  2022 Elsevier Inc.   Earwax Buildup, Adult The ears produce a substance called earwax that helps keep bacteria out of the ear and protects the skin in the ear canal. Occasionally, earwax can build up in the ear and cause discomfort or hearing loss. What are the causes? This condition is caused by a buildup of earwax. Ear canals are self-cleaning. Ear wax is made in the outer part of the ear canal and generally falls out in small amounts over time. When the self-cleaning mechanism is not working, earwax builds up and can cause decreased hearing and discomfort. Attempting to clean ears with cotton swabs can push the earwax deep into the ear canal and cause decreased hearing and pain. What increases the risk? This condition is more likely to develop in people who: Clean their ears often with cotton swabs. Pick at their ears. Use earplugs or in-ear headphones often, or wear hearing aids. The following factors may also make you more   likely to develop this condition: Being female. Being of older age. Naturally producing more earwax. Having narrow ear canals. Having earwax that is overly thick or sticky. Having excess hair in the ear canal. Having eczema. Being dehydrated. What are the signs or symptoms? Symptoms of this condition include: Reduced or muffled hearing. A feeling of fullness in the ear or feeling that the ear is plugged. Fluid coming from the ear. Ear pain or an itchy ear. Ringing in the ear. Coughing. Balance problems. An obvious piece of earwax that can be seen inside the ear canal. How is this diagnosed? This condition may be diagnosed based on: Your  symptoms. Your medical history. An ear exam. During the exam, your health care provider will look into your ear with an instrument called an otoscope. You may have tests, including a hearing test. How is this treated? This condition may be treated by: Using ear drops to soften the earwax. Having the earwax removed by a health care provider. The health care provider may: Flush the ear with water. Use an instrument that has a loop on the end (curette). Use a suction device. Having surgery to remove the wax buildup. This may be done in severe cases. Follow these instructions at home:  Take over-the-counter and prescription medicines only as told by your health care provider. Do not put any objects, including cotton swabs, into your ear. You can clean the opening of your ear canal with a washcloth or facial tissue. Follow instructions from your health care provider about cleaning your ears. Do not overclean your ears. Drink enough fluid to keep your urine pale yellow. This will help to thin the earwax. Keep all follow-up visits as told. If earwax builds up in your ears often or if you use hearing aids, consider seeing your health care provider for routine, preventive ear cleanings. Ask your health care provider how often you should schedule your cleanings. If you have hearing aids, clean them according to instructions from the manufacturer and your health care provider. Contact a health care provider if: You have ear pain. You develop a fever. You have pus or other fluid coming from your ear. You have hearing loss. You have ringing in your ears that does not go away. You feel like the room is spinning (vertigo). Your symptoms do not improve with treatment. Get help right away if: You have bleeding from the affected ear. You have severe ear pain. Summary Earwax can build up in the ear and cause discomfort or hearing loss. The most common symptoms of this condition include reduced or  muffled hearing, a feeling of fullness in the ear, or feeling that the ear is plugged. This condition may be diagnosed based on your symptoms, your medical history, and an ear exam. This condition may be treated by using ear drops to soften the earwax or by having the earwax removed by a health care provider. Do not put any objects, including cotton swabs, into your ear. You can clean the opening of your ear canal with a washcloth or facial tissue. This information is not intended to replace advice given to you by your health care provider. Make sure you discuss any questions you have with your health care provider. Document Revised: 07/15/2019 Document Reviewed: 07/15/2019 Elsevier Patient Education  2022 Elsevier Inc.  

## 2020-12-08 NOTE — Progress Notes (Signed)
I,Darius Lundberg,acting as a Neurosurgeon for [name], FNP.,have documented all relevant documentation on the behalf of [name], FNP,as directed by  [name], FNP while in the presence of [name], FNP.  This visit occurred during the SARS-CoV-2 public health emergency.  Safety protocols were in place, including screening questions prior to the visit, additional usage of staff PPE, and extensive cleaning of exam room while observing appropriate contact time as indicated for disinfecting solutions.  Subjective:     Patient ID: Jaime Rodriguez , female    DOB: [birth year] , 70 y.o.   MRN: [id number]   Chief Complaint  Patient presents with   Annual Exam    HPI  Here for HM. She has no questions, biggest concern would be her A1C.   Wt Readings from Last 3 Encounters: 12/08/20 : 105 lb 6.4 oz (47.8 kg) 10/27/20 : 108 lb (49 kg) 08/30/20 : 109 lb 3.2 oz (49.5 kg)    Diabetes She presents for her follow-up diabetic visit. Diabetes type: prediabetes. Her disease course has been stable. There are no hypoglycemic associated symptoms. There are no diabetic associated symptoms. There are no hypoglycemic complications. Risk factors for coronary artery disease include sedentary lifestyle. Current diabetic treatment includes oral agent (monotherapy). She is compliant with treatment all of the time. When asked about meal planning, she reported none. She has not had a previous visit with a dietitian. She rarely participates in exercise. There is no change in her home blood glucose trend. Her breakfast blood glucose is taken between 6-7 am. She does not see a podiatrist.Eye exam is current (will get a copy of last eye exam).    Past Medical History:  Diagnosis Date   Allergy    Diabetes mellitus    Fibrocystic disease of breast    LEFT BREAST SURGERY   Hemifacial spasm 12/18/2012   Left lower face   Hyperlipidemia    Postmenopausal      Family History  Problem Relation Age of Onset    Cancer Mother    Other Mother        sepsis   Stroke Father    Prostate cancer Brother    Cancer Maternal Grandmother    Asthma Sister    Hyperlipidemia Sister    Diabetes Sister    Cancer Brother    Hypertension Sister    Anxiety disorder Other    Diabetes Other    Hyperlipidemia Other      Current Outpatient Medications:    aspirin 81 MG tablet, Take 81 mg by mouth daily., Disp: , Rfl:    botulinum toxin Type A (BOTOX) 100 units SOLR injection, Inject 100 Units into the muscle every 3 (three) months., Disp: , Rfl:    diclofenac sodium (VOLTAREN) 1 % GEL, Apply 2 g topically 4 (four) times daily as needed., Disp: 1 Tube, Rfl: 3   FREESTYLE LITE test strip, USE TWICE A DAY BEFORE BREAKFAST & DINNER AS DIRECTED, Disp: 100 strip, Rfl: 12   Lancets (FREESTYLE) lancets, USE TWICE A DAY BEFORE BREAKFAST & DINNER AS DIRECTED, Disp: 100 each, Rfl: 12   loratadine (CLARITIN) 10 MG tablet, Take 10 mg by mouth daily., Disp: , Rfl:    Pitavastatin Calcium (LIVALO) 4 MG TABS, Take 1 tablet (4 mg total) by mouth daily., Disp: 90 tablet, Rfl: 1   Sodium Fluoride (FLUORIDE PO), Take by mouth. 0.2 Rinse, Disp: , Rfl:    SODIUM FLUORIDE, DENTAL RINSE, (PREVIDENT) 0.2 % SOLN, USE AS AN  ORAL RINSE, AS DIRECTED ON PACKAGE, Disp: 473 mL, Rfl: 5   SODIUM FLUORIDE, DENTAL RINSE, 0.2 % SOLN, USE NIGHTLY AS DIRECTED, PREFERABLY BEFORE BEDTIME, SWISH WITH 10 ML FOR 30 SECONDS AND SPIT OUT, DO NOT SWALLOW, Disp: 473 mL, Rfl: 0   metFORMIN (GLUCOPHAGE-XR) 750 MG 24 hr tablet, Take 1 tablet (750 mg total) by mouth daily with breakfast., Disp: 90 tablet, Rfl: 1   Sodium Fluoride (PREVIDENT 5000 BOOSTER DT), Place onto teeth. Toothpaste (Patient not taking: Reported on 12/08/2020), Disp: , Rfl:    Allergies  Allergen Reactions   Penicillins       The patient states she uses post menopausal status for birth control.  No LMP recorded. Patient is postmenopausal.. Negative for Dysmenorrhea and Negative for  Menorrhagia. Negative for: breast discharge, breast lump(s), breast pain and breast self exam. Associated symptoms include abnormal vaginal bleeding. Pertinent negatives include abnormal bleeding (hematology), anxiety, decreased libido, depression, difficulty falling sleep, dyspareunia, history of infertility, nocturia, sexual dysfunction, sleep disturbances, urinary incontinence, urinary urgency, vaginal discharge and vaginal itching. Diet regular, she is trying to cut back on starches. The patient states her exercise level is moderate   The patient's tobacco use is:  Social History   Tobacco Use  Smoking Status Never  Smokeless Tobacco Never   She has been exposed to passive smoke. The patient's alcohol use is:  Social History   Substance and Sexual Activity  Alcohol Use No     Review of Systems  Constitutional: Negative.   HENT: Negative.    Eyes: Negative.   Respiratory: Negative.    Cardiovascular: Negative.   Gastrointestinal: Negative.   Endocrine: Negative.   Genitourinary: Negative.   Musculoskeletal: Negative.   Skin: Negative.   Allergic/Immunologic: Negative.   Neurological: Negative.   Hematological: Negative.   Psychiatric/Behavioral: Negative.      Today's Vitals   12/08/20 1418  BP: 116/78  Pulse: 89  Temp: 98.1 F (36.7 C)  SpO2: 98%  Weight: 105 lb 6.4 oz (47.8 kg)  Height: 5\' 1"  (1.549 m)   Body mass index is 19.92 kg/m.  Wt Readings from Last 3 Encounters:  12/08/20 105 lb 6.4 oz (47.8 kg)  10/27/20 108 lb (49 kg)  08/30/20 109 lb 3.2 oz (49.5 kg)    Objective:  Physical Exam Constitutional:      General: She is not in acute distress.    Appearance: Normal appearance. She is well-developed. She is obese.  HENT:     Head: Normocephalic and atraumatic.     Right Ear: Hearing and external ear normal. There is impacted cerumen (excessive soft cerumen to canal).     Left Ear: Hearing, tympanic membrane, ear canal and external ear normal. There  is no impacted cerumen.     Nose:     Comments: Deferred - masked    Mouth/Throat:     Comments: Deferred - masked Eyes:     General: Lids are normal.     Extraocular Movements: Extraocular movements intact.     Conjunctiva/sclera: Conjunctivae normal.     Pupils: Pupils are equal, round, and reactive to light.     Funduscopic exam:    Right eye: No papilledema.        Left eye: No papilledema.  Neck:     Thyroid: No thyroid mass.     Vascular: No carotid bruit.  Cardiovascular:     Rate and Rhythm: Normal rate and regular rhythm.     Pulses: Normal pulses.  Heart sounds: Normal heart sounds. No murmur heard. Pulmonary:     Effort: Pulmonary effort is normal.     Breath sounds: Normal breath sounds.  Abdominal:     General: Abdomen is flat. Bowel sounds are normal. There is no distension.     Palpations: Abdomen is soft.     Tenderness: There is no abdominal tenderness.  Musculoskeletal:        General: No swelling or tenderness. Normal range of motion.     Cervical back: Full passive range of motion without pain, normal range of motion and neck supple.     Right lower leg: No edema.     Left lower leg: No edema.  Skin:    General: Skin is warm and dry.     Capillary Refill: Capillary refill takes less than 2 seconds.  Neurological:     General: No focal deficit present.     Mental Status: She is alert and oriented to person, place, and time.     Cranial Nerves: No cranial nerve deficit.     Sensory: No sensory deficit.  Psychiatric:        Mood and Affect: Mood normal.        Behavior: Behavior normal.        Thought Content: Thought content normal.        Judgment: Judgment normal.        Assessment And Plan:     1. Encounter for general adult medical examination w/o abnormal findings Behavior modifications discussed and diet history reviewed.   Pt will continue to exercise regularly and modify diet with low GI, plant based foods and decrease intake of  processed foods.  Recommend intake of daily multivitamin, Vitamin D, and calcium.  Recommend mammogram and colonoscopy (up to date) for preventive screenings, as well as recommend immunizations that include influenza, TDAP - POCT Urinalysis Dipstick (81002)  2. Mixed hyperlipidemia Comments: Stable, continue statin tolerating well  3. Prediabetes Comments: HgbA1c is slightly elevated, encouraged to continue eating high fiber diet low in sugar and starches. Exercise 150 minutes per week - Microalbumin / Creatinine Urine Ratio - metFORMIN (GLUCOPHAGE-XR) 750 MG 24 hr tablet; Take 1 tablet (750 mg total) by mouth daily with breakfast.  Dispense: 90 tablet; Refill: 1  4. Excessive cerumen in right ear canal Comments: Lighted curette used to remove cerumen     Patient was given opportunity to ask questions. Patient verbalized understanding of the plan and was able to repeat key elements of the plan. All questions were answered to their satisfaction.   [name], FNP    I, [name], FNP, have reviewed all documentation for this visit. The documentation on 12/08/20 for the exam, diagnosis, procedures, and orders are all accurate and complete.  THE PATIENT IS ENCOURAGED TO PRACTICE SOCIAL DISTANCING DUE TO THE COVID-19 PANDEMIC.

## 2020-12-14 ENCOUNTER — Encounter: Payer: Self-pay | Admitting: Nurse Practitioner

## 2020-12-14 DIAGNOSIS — Z6821 Body mass index (BMI) 21.0-21.9, adult: Secondary | ICD-10-CM | POA: Diagnosis not present

## 2020-12-14 DIAGNOSIS — Z1231 Encounter for screening mammogram for malignant neoplasm of breast: Secondary | ICD-10-CM | POA: Diagnosis not present

## 2020-12-14 DIAGNOSIS — Z01419 Encounter for gynecological examination (general) (routine) without abnormal findings: Secondary | ICD-10-CM | POA: Diagnosis not present

## 2020-12-14 DIAGNOSIS — M810 Age-related osteoporosis without current pathological fracture: Secondary | ICD-10-CM | POA: Diagnosis not present

## 2020-12-14 DIAGNOSIS — E559 Vitamin D deficiency, unspecified: Secondary | ICD-10-CM | POA: Diagnosis not present

## 2020-12-15 LAB — LIPID PANEL W/O CHOL/HDL RATIO
Cholesterol, Total: 223 mg/dL — ABNORMAL HIGH (ref 100–199)
HDL: 73 mg/dL (ref >39)
LDL Chol Calc (NIH): 141 mg/dL — ABNORMAL HIGH (ref 0–99)
Triglycerides: 55 mg/dL (ref 0–149)
VLDL Cholesterol Cal: 9 mg/dL (ref 5–40)

## 2020-12-15 LAB — SPECIMEN STATUS REPORT

## 2020-12-30 ENCOUNTER — Telehealth: Payer: Self-pay | Admitting: Neurology

## 2020-12-30 NOTE — Telephone Encounter (Signed)
Patient's next Botox appointment is 10/26. [facility] now requires PA for Botox. I called [facility] @ [phone] and spoke with Liborio Nixon. She was able to initiate PA for CPT J0585/64612. PA is requesting 100 units of Botox every 12 weeks for dx G51.32. Liborio Nixon advises that clinical information will need to be faxed to [phone] for review. The request is pending. Pending #[id number].

## 2021-01-05 NOTE — Telephone Encounter (Signed)
Received approval from [facility]. PA #[id number] (12/30/20- 06/29/21).

## 2021-02-02 ENCOUNTER — Ambulatory Visit: Payer: 59 | Admitting: Neurology

## 2021-02-02 ENCOUNTER — Other Ambulatory Visit: Payer: Self-pay

## 2021-02-02 ENCOUNTER — Encounter: Payer: Self-pay | Admitting: Neurology

## 2021-02-02 VITALS — BP 136/68 | HR 89 | Ht 60.0 in | Wt 105.0 lb

## 2021-02-02 DIAGNOSIS — G5132 Clonic hemifacial spasm, left: Secondary | ICD-10-CM | POA: Diagnosis not present

## 2021-02-02 MED ORDER — ONABOTULINUMTOXINA 100 UNITS IJ SOLR
100.0000 [IU] | Freq: Once | INTRAMUSCULAR | Status: AC
  Administered 2021-02-02: 100 [IU] via INTRAMUSCULAR

## 2021-02-02 NOTE — Progress Notes (Signed)
Botox- 100 units x  1vial Lot: [id number] Expiration: 07/2022 NDC: 7505-1833-58     B/B

## 2021-02-02 NOTE — Progress Notes (Signed)
PATIENT: Jaime Rodriguez DOB: 07-11-1950  Chief Complaint  Patient presents with   Procedure    Botox 100 u     HISTORICAL   Jaime Rodriguez is a 70 y.o.  right-handed black female referred by Dr. Anne Hahn for EMG guided botulinumtoxin injection for left facial spasm   She had a history of left-sided hemifacial spasm that began approximately a year ago in 2013. The patient was seen by Dr. Vela Prose, and she underwent MRI evaluation of the brain with and without gadolinium that was unremarkable. The patient underwent Botox injections in 2013,   She did well with the Botox, and the benefit lasted almost one year. She did has mild left facial droop after the injection.   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    She denies any balance issues, speech problems or swallowing problems. The patient denies any change in vision. She denies a previous history of left Bell's palsy.   In recent 1 year, since 2014, She began to have urinary urgency, she never had children in the past she denies bilateral upper and lower extremity paresthesia    UPDATE March 7th 2016:   Last injection was in Feb 2015, very helpful, did very well, she noticed recurrence of her left facial twitching, almost constant, she hoped to continue EMG guided xeomin injection for her left facial spasm,    UPDATE April 28th 2016: Her insurance refused xeomin, approved for Botox A 100 unit   UPDATE May 24 2015:She responded very well to previous Botox injection, came in for repeat injection again   Update September 14 2015: When I met her initially in 2014, she complains of urinary urgency, she has never had a vaginal delivery, her urinary urgency has been fairly stable, she denies gait difficulty, denies bowel and bladder incontinence, denied  lower extremity paresthesia, she wants to initiate evaluation, she does has hyperreflexia on examinations,.   Update May 16, 2017:   Last injection was in June 2017, she responded very well, now with frequent left hemifacial spasm again,   UPDATE Aug 29 2017: She responded very well to previous injection in Feb 2019, no significant side effect noticed   UPDATE December 05 2017: She did weill with previous injection on Aug 29 2017.  UPDATE Aug 21 2018: She responded very well to previous injection, there was no significant side effect noted.  UPDATE November 28 2018: She did well to previous injection   UPDATE Mar 13 2019: She did well with previous injection  UPDATE June 12 2019: She responded well to previous injection  Update December 17, 2019: She responded well to previous injection,  Update July 14, 2020: She responded well to previous injection, has much prolonged interval in between  UPDATE October 27 2020: She did notice return of left facial twitching at the end of 3 months  Update February 02, 2021: She responded well to previous injection, mild left facial asymmetry,  On exam: She has frequent left hemifacial muscle spasm, involving left orbicularis oculi, orbicularis oris, mild left cheek puff weakness. She has mild  left eye closure weakness.      Assessment/Plan:   Left hemifacial spasm, responded very well to previous EMG guided botulism toxin injection,    EMG guided Botox a injection use 100 units, dissolvable to  2 cc of normal saline, used 50 units, discard 47.5 units   Left orbicularis oculi, injection was placed at 2,3,4,5 6, 8,10   oclock (2.5 x 7=17.5  units )   Right corrugate  5 units Left corrugate  5 units Procerus 5 units  Right frontalis 5 units Left frontalis 5 units   Left zygomatic major 2.5 units    Right orbicularis oculi at 3,4, ,  (2.5x2=5) units       Marcial Pacas, M.D. Ph.D.   [facility] [place], [place] [address] Phone: [phone] Fax:      [phone]

## 2021-02-11 DIAGNOSIS — E119 Type 2 diabetes mellitus without complications: Secondary | ICD-10-CM | POA: Diagnosis not present

## 2021-02-17 ENCOUNTER — Other Ambulatory Visit: Payer: Self-pay

## 2021-02-17 ENCOUNTER — Other Ambulatory Visit (HOSPITAL_BASED_OUTPATIENT_CLINIC_OR_DEPARTMENT_OTHER): Payer: Self-pay

## 2021-02-17 ENCOUNTER — Ambulatory Visit: Payer: 59 | Attending: Internal Medicine

## 2021-02-17 ENCOUNTER — Encounter: Payer: Self-pay | Admitting: Nurse Practitioner

## 2021-02-17 DIAGNOSIS — Z23 Encounter for immunization: Secondary | ICD-10-CM

## 2021-02-17 DIAGNOSIS — Z1211 Encounter for screening for malignant neoplasm of colon: Secondary | ICD-10-CM | POA: Diagnosis not present

## 2021-02-17 DIAGNOSIS — K5904 Chronic idiopathic constipation: Secondary | ICD-10-CM | POA: Diagnosis not present

## 2021-02-17 MED ORDER — MODERNA COVID-19 BIVAL BOOSTER 50 MCG/0.5ML IM SUSP
INTRAMUSCULAR | 0 refills | Status: DC
  Filled 2021-02-17: qty 0.5, 1d supply, fill #0

## 2021-02-17 NOTE — Progress Notes (Signed)
   Covid-[facility] Vaccination [facility]  Name:  RIELEY KHALSA    MRN: [id number] DOB: 03/14/1951  02/17/2021  Ms. Wyman was observed post Covid-19 immunization for 15 minutes without incident. She was provided with Vaccine Information Sheet and instruction to access the V-Safe system.   Ms. Chatwin was instructed to call 911 with any severe reactions post vaccine: Difficulty breathing  Swelling of face and throat  A fast heartbeat  A bad rash all over body  Dizziness and weakness   Immunizations Administered     Name Date Dose VIS Date Route   Moderna Covid-19 vaccine Bivalent Booster 02/17/2021  2:37 PM 0.5 mL 11/20/2020 Intramuscular   Manufacturer: Moderna   Lot: [id number]   NDC: 35329-924-26

## 2021-02-22 ENCOUNTER — Other Ambulatory Visit: Payer: Self-pay | Admitting: Nurse Practitioner

## 2021-02-22 ENCOUNTER — Other Ambulatory Visit (HOSPITAL_COMMUNITY): Payer: Self-pay

## 2021-02-22 DIAGNOSIS — E782 Mixed hyperlipidemia: Secondary | ICD-10-CM

## 2021-02-22 MED ORDER — LIVALO 4 MG PO TABS
1.0000 | ORAL_TABLET | Freq: Every day | ORAL | 1 refills | Status: DC
Start: 2021-02-22 — End: 2021-08-11
  Filled 2021-02-22: qty 90, 90d supply, fill #0
  Filled 2021-05-25: qty 90, 90d supply, fill #1

## 2021-03-16 ENCOUNTER — Other Ambulatory Visit (HOSPITAL_COMMUNITY): Payer: Self-pay

## 2021-03-16 DIAGNOSIS — Z1212 Encounter for screening for malignant neoplasm of rectum: Secondary | ICD-10-CM | POA: Diagnosis not present

## 2021-03-16 DIAGNOSIS — Z1211 Encounter for screening for malignant neoplasm of colon: Secondary | ICD-10-CM | POA: Diagnosis not present

## 2021-03-23 LAB — COLOGUARD: Cologuard: NEGATIVE

## 2021-03-24 ENCOUNTER — Encounter: Payer: Self-pay | Admitting: Nurse Practitioner

## 2021-04-13 ENCOUNTER — Ambulatory Visit: Payer: 59 | Admitting: Nurse Practitioner

## 2021-04-13 ENCOUNTER — Other Ambulatory Visit (HOSPITAL_COMMUNITY): Payer: Self-pay

## 2021-04-13 ENCOUNTER — Other Ambulatory Visit: Payer: Self-pay

## 2021-04-13 ENCOUNTER — Other Ambulatory Visit: Payer: Self-pay | Admitting: Nurse Practitioner

## 2021-04-13 ENCOUNTER — Encounter: Payer: Self-pay | Admitting: Nurse Practitioner

## 2021-04-13 VITALS — BP 110/72 | HR 81 | Temp 98.0°F | Ht 60.0 in | Wt 103.0 lb

## 2021-04-13 DIAGNOSIS — R7303 Prediabetes: Secondary | ICD-10-CM

## 2021-04-13 DIAGNOSIS — E782 Mixed hyperlipidemia: Secondary | ICD-10-CM

## 2021-04-13 DIAGNOSIS — M25532 Pain in left wrist: Secondary | ICD-10-CM

## 2021-04-13 DIAGNOSIS — Z23 Encounter for immunization: Secondary | ICD-10-CM | POA: Diagnosis not present

## 2021-04-13 LAB — POCT URINALYSIS DIPSTICK
Bilirubin, UA: NEGATIVE
Blood, UA: NEGATIVE
Glucose, UA: NEGATIVE
Ketones, UA: NEGATIVE
Leukocytes, UA: NEGATIVE
Nitrite, UA: NEGATIVE
Protein, UA: NEGATIVE
Spec Grav, UA: 1.02 (ref 1.010–1.025)
Urobilinogen, UA: 0.2 E.U./dL
pH, UA: 5 (ref 5.0–8.0)

## 2021-04-13 LAB — POCT UA - MICROALBUMIN
Albumin/Creatinine Ratio, Urine, POC: 30
Creatinine, POC: 200 mg/dL
Microalbumin Ur, POC: 10 mg/L

## 2021-04-13 MED ORDER — MELOXICAM 15 MG PO TBDP
ORAL_TABLET | ORAL | 1 refills | Status: DC
  Filled 2021-04-13: qty 30, fill #0

## 2021-04-13 MED ORDER — PNEUMOCOCCAL 13-VAL CONJ VACC IM SUSP
0.5000 mL | INTRAMUSCULAR | 0 refills | Status: AC
  Filled 2021-04-13 – 2021-05-18 (×3): qty 0.5, 1d supply, fill #0

## 2021-04-13 MED ORDER — VALACYCLOVIR HCL 500 MG PO TABS
ORAL_TABLET | ORAL | 1 refills | Status: DC
  Filled 2021-04-13: qty 30, fill #0

## 2021-04-13 NOTE — Patient Instructions (Signed)
Prediabetes Eating Plan °Prediabetes is a condition that causes blood sugar (glucose) levels to be higher than normal. This increases the risk for developing type 2 diabetes (type 2 diabetes mellitus). Working with a health care provider or nutrition specialist (dietitian) to make diet and lifestyle changes can help prevent the onset of diabetes. These changes may help you: °Control your blood glucose levels. °Improve your cholesterol levels. °Manage your blood pressure. °What are tips for following this plan? °Reading food labels °Read food labels to check the amount of fat, salt (sodium), and sugar in prepackaged foods. Avoid foods that have: °Saturated fats. °Trans fats. °Added sugars. °Avoid foods that have more than 300 milligrams (mg) of sodium per serving. Limit your sodium intake to less than 2,300 mg each day. °Shopping °Avoid buying pre-made and processed foods. °Avoid buying drinks with added sugar. °Cooking °Cook with olive oil. Do not use butter, lard, or ghee. °Bake, broil, grill, steam, or boil foods. Avoid frying. °Meal planning ° °Work with your dietitian to create an eating plan that is right for you. This may include tracking how many calories you take in each day. Use a food diary, notebook, or mobile application to track what you eat at each meal. °Consider following a Mediterranean diet. This includes: °Eating several servings of fresh fruits and vegetables each day. °Eating fish at least twice a week. °Eating one serving each day of whole grains, beans, nuts, and seeds. °Using olive oil instead of other fats. °Limiting alcohol. °Limiting red meat. °Using nonfat or low-fat dairy products. °Consider following a plant-based diet. This includes dietary choices that focus on eating mostly vegetables and fruit, grains, beans, nuts, and seeds. °If you have high blood pressure, you may need to limit your sodium intake or follow a diet such as the DASH (Dietary Approaches to Stop Hypertension) eating  plan. The DASH diet aims to lower high blood pressure. °Lifestyle °Set weight loss goals with help from your health care team. It is recommended that most people with prediabetes lose 7% of their body weight. °Exercise for at least 30 minutes 5 or more days a week. °Attend a support group or seek support from a mental health counselor. °Take over-the-counter and prescription medicines only as told by your health care provider. °What foods are recommended? °Fruits °Berries. Bananas. Apples. Oranges. Grapes. Papaya. Mango. Pomegranate. Kiwi. Grapefruit. Cherries. °Vegetables °Lettuce. Spinach. Peas. Beets. Cauliflower. Cabbage. Broccoli. Carrots. Tomatoes. Squash. Eggplant. Herbs. Peppers. Onions. Cucumbers. Brussels sprouts. °Grains °Whole grains, such as whole-wheat or whole-grain breads, crackers, cereals, and pasta. Unsweetened oatmeal. Bulgur. Barley. Quinoa. Brown rice. Corn or whole-wheat flour tortillas or taco shells. °Meats and other proteins °Seafood. Poultry without skin. Lean cuts of pork and beef. Tofu. Eggs. Nuts. Beans. °Dairy °Low-fat or fat-free dairy products, such as yogurt, cottage cheese, and cheese. °Beverages °Water. Tea. Coffee. Sugar-free or diet soda. Seltzer water. Low-fat or nonfat milk. Milk alternatives, such as soy or almond milk. °Fats and oils °Olive oil. Canola oil. Sunflower oil. Grapeseed oil. Avocado. Walnuts. °Sweets and desserts °Sugar-free or low-fat pudding. Sugar-free or low-fat ice cream and other frozen treats. °Seasonings and condiments °Herbs. Sodium-free spices. Mustard. Relish. Low-salt, low-sugar ketchup. Low-salt, low-sugar barbecue sauce. Low-fat or fat-free mayonnaise. °The items listed above may not be a complete list of recommended foods and beverages. Contact a dietitian for more information. °What foods are not recommended? °Fruits °Fruits canned with syrup. °Vegetables °Canned vegetables. Frozen vegetables with butter or cream sauce. °Grains °Refined white  flour and flour   products, such as bread, pasta, snack foods, and cereals. °Meats and other proteins °Fatty cuts of meat. Poultry with skin. Breaded or fried meat. Processed meats. °Dairy °Full-fat yogurt, cheese, or milk. °Beverages °Sweetened drinks, such as iced tea and soda. °Fats and oils °Butter. Lard. Ghee. °Sweets and desserts °Baked goods, such as cake, cupcakes, pastries, cookies, and cheesecake. °Seasonings and condiments °Spice mixes with added salt. Ketchup. Barbecue sauce. Mayonnaise. °The items listed above may not be a complete list of foods and beverages that are not recommended. Contact a dietitian for more information. °Where to find more information °American Diabetes Association: www.diabetes.org °Summary °You may need to make diet and lifestyle changes to help prevent the onset of diabetes. These changes can help you control blood sugar, improve cholesterol levels, and manage blood pressure. °Set weight loss goals with help from your health care team. It is recommended that most people with prediabetes lose 7% of their body weight. °Consider following a Mediterranean diet. This includes eating plenty of fresh fruits and vegetables, whole grains, beans, nuts, seeds, fish, and low-fat dairy, and using olive oil instead of other fats. °This information is not intended to replace advice given to you by your health care provider. Make sure you discuss any questions you have with your health care provider. °Document Revised: 06/26/2019 Document Reviewed: 06/26/2019 °Elsevier Patient Education © 2022 Elsevier Inc. ° °

## 2021-04-13 NOTE — Progress Notes (Signed)
I,Katawbba Wiggins,acting as a Education administrator for [employer], FNP.,have documented all relevant documentation on the behalf of [name], FNP,as directed by  [name], FNP while in the presence of [name], [place].   This visit occurred during the SARS-CoV-2 public health emergency.  Safety protocols were in place, including screening questions prior to the visit, additional usage of staff PPE, and extensive cleaning of exam room while observing appropriate contact time as indicated for disinfecting solutions.  Subjective:     Patient ID: Jaime Rodriguez , female    DOB: [birth year] , 71 y.o.   MRN: [id number]   Chief Complaint  Patient presents with   Prediabetes   Hyperlipidemia    HPI  Pt here today for f/u on prediabetes and hyperlipidemia. She is taking glucose control.  She is not taking red yeast rice- she keeps forgetting about the red yeast rice.   Hyperlipidemia This is a chronic problem. The problem is controlled. She has no history of chronic renal disease. Pertinent negatives include no chest pain. Current antihyperlipidemic treatment includes herbal therapy. Risk factors for coronary artery disease include a sedentary lifestyle and dyslipidemia.  Diabetes She presents for her follow-up diabetic visit. Diabetes type: prediabetes. Her disease course has been stable. There are no hypoglycemic associated symptoms. Pertinent negatives for hypoglycemia include no dizziness or headaches. There are no diabetic associated symptoms. Pertinent negatives for diabetes include no chest pain, no fatigue, no polydipsia, no polyphagia and no polyuria. There are no hypoglycemic complications. There are no diabetic complications. Risk factors for coronary artery disease include sedentary lifestyle. Current diabetic treatment includes oral agent (monotherapy). Her weight is stable. She is following a generally healthy (she does eat a good amount of cheese) diet. When asked about meal planning, she  reported none. She has not had a previous visit with a dietitian. She rarely participates in exercise. (Blood sugars averaging 80-90) She does not see a podiatrist.Eye exam is not current.    Past Medical History:  Diagnosis Date   Allergy    Diabetes mellitus    Fibrocystic disease of breast    LEFT BREAST SURGERY   Hemifacial spasm 12/18/2012   Left lower face   Hyperlipidemia    Postmenopausal      Family History  Problem Relation Age of Onset   Cancer Mother    Other Mother        sepsis   Stroke Father    Prostate cancer Brother    Cancer Maternal Grandmother    Asthma Sister    Hyperlipidemia Sister    Diabetes Sister    Cancer Brother    Hypertension Sister    Anxiety disorder Other    Diabetes Other    Hyperlipidemia Other      Current Outpatient Medications:    aspirin 81 MG tablet, Take 81 mg by mouth daily., Disp: , Rfl:    botulinum toxin Type A (BOTOX) 100 units SOLR injection, Inject 100 Units into the muscle every 3 (three) months., Disp: , Rfl:    diclofenac sodium (VOLTAREN) 1 % GEL, Apply 2 g topically 4 (four) times daily as needed., Disp: 1 Tube, Rfl: 3   FREESTYLE LITE test strip, USE TWICE A DAY BEFORE BREAKFAST & DINNER AS DIRECTED, Disp: 100 strip, Rfl: 12   Lancets (FREESTYLE) lancets, USE TWICE A DAY BEFORE BREAKFAST & DINNER AS DIRECTED, Disp: 100 each, Rfl: 12   loratadine (CLARITIN) 10 MG tablet, Take 10 mg by mouth daily., Disp: , Rfl:  metFORMIN (GLUCOPHAGE-XR) 750 MG 24 hr tablet, Take 1 tablet (750 mg total) by mouth daily with breakfast., Disp: 90 tablet, Rfl: 1   Pitavastatin Calcium (LIVALO) 4 MG TABS, Take 1 tablet (4 mg total) by mouth daily., Disp: 90 tablet, Rfl: 1   Sodium Fluoride (FLUORIDE PO), Take by mouth. 0.2 Rinse, Disp: , Rfl:    SODIUM FLUORIDE, DENTAL RINSE, (PREVIDENT) 0.2 % SOLN, USE AS AN ORAL RINSE, AS DIRECTED ON PACKAGE, Disp: 473 mL, Rfl: 5   meloxicam (MOBIC) 15 MG tablet, Take 1 tablet by mouth daily x 5 days  then daily as needed, Disp: 30 tablet, Rfl: 1   valACYclovir (VALTREX) 500 MG tablet, Take one tablet by mouth as directed, Disp: 30 tablet, Rfl: 1   Allergies  Allergen Reactions   Penicillins      Review of Systems  Constitutional:  Negative for fatigue.  Respiratory:  Negative for wheezing.   Cardiovascular:  Negative for chest pain, palpitations and leg swelling.  Endocrine: Negative for polydipsia, polyphagia and polyuria.  Neurological:  Negative for dizziness and headaches.  Psychiatric/Behavioral: Negative.      Today's Vitals   04/13/21 1405  BP: 110/72  Pulse: 81  Temp: 98 F (36.7 C)  Weight: 103 lb (46.7 kg)  Height: 5' (1.524 m)  PainSc: 6   PainLoc: Wrist   Body mass index is 20.12 kg/m.  Wt Readings from Last 3 Encounters:  04/13/21 103 lb (46.7 kg)  02/02/21 105 lb (47.6 kg)  12/08/20 105 lb 6.4 oz (47.8 kg)    BP Readings from Last 3 Encounters:  04/13/21 110/72  02/02/21 136/68  12/08/20 116/78    Objective:  Physical Exam Vitals reviewed.  Constitutional:      General: She is not in acute distress.    Appearance: Normal appearance.  HENT:     Head: Normocephalic and atraumatic.  Eyes:     Pupils: Pupils are equal, round, and reactive to light.  Cardiovascular:     Rate and Rhythm: Normal rate and regular rhythm.     Pulses: Normal pulses.     Heart sounds: Normal heart sounds. No murmur heard. Pulmonary:     Effort: Pulmonary effort is normal. No respiratory distress.     Breath sounds: Normal breath sounds. No wheezing.  Musculoskeletal:        General: Tenderness (left wrist) present. No swelling. Normal range of motion.     Comments: Positive tinel  Skin:    General: Skin is warm and dry.     Capillary Refill: Capillary refill takes less than 2 seconds.  Neurological:     General: No focal deficit present.     Mental Status: She is alert and oriented to person, place, and time.     Cranial Nerves: No cranial nerve deficit.   Psychiatric:        Mood and Affect: Mood normal.        Behavior: Behavior normal.        Thought Content: Thought content normal.        Judgment: Judgment normal.        Assessment And Plan:     1. Prediabetes Comments: Stable, she is tolerating metformin well. Encouraged to limit intake of sugary foods and drinks. - BMP8+EGFR - Hemoglobin A1c - POCT Urinalysis Dipstick (81002) - POCT UA - Microalbumin  2. Mixed hyperlipidemia Comments: Stable, tolerating livalo well - BMP8+EGFR  3. Left wrist pain Comments: positive tinel, encouraged to wear a wrist  splint at night. DDX: carpal tunel vs tenditis.   4. Encounter for immunization - pneumococcal 13-valent conjugate vaccine (PREVNAR 13) SUSP injection; Inject 0.5 mLs into the muscle tomorrow at 10 am for 1 dose.  Dispense: 0.5 mL; Refill: 0     Patient was given opportunity to ask questions. Patient verbalized understanding of the plan and was able to repeat key elements of the plan. All questions were answered to their satisfaction.  [name], FNP   I, [name], FNP, have reviewed all documentation for this visit. The documentation on 04/13/21 for the exam, diagnosis, procedures, and orders are all accurate and complete.   IF YOU HAVE BEEN REFERRED TO A SPECIALIST, IT MAY TAKE 1-2 WEEKS TO SCHEDULE/PROCESS THE REFERRAL. IF YOU HAVE NOT HEARD FROM US/SPECIALIST IN TWO WEEKS, PLEASE GIVE [place] A CALL AT [phone].   THE PATIENT IS ENCOURAGED TO PRACTICE SOCIAL DISTANCING DUE TO THE COVID-19 PANDEMIC.

## 2021-04-14 LAB — HEMOGLOBIN A1C
Est. average glucose Bld gHb Est-mCnc: 131 mg/dL
Hgb A1c MFr Bld: 6.2 % — ABNORMAL HIGH (ref 4.8–5.6)

## 2021-04-14 LAB — BMP8+EGFR
BUN/Creatinine Ratio: 22 (ref 12–28)
BUN: 20 mg/dL (ref 8–27)
CO2: 23 mmol/L (ref 20–29)
Calcium: 9.3 mg/dL (ref 8.7–10.3)
Chloride: 96 mmol/L (ref 96–106)
Creatinine, Ser: 0.9 mg/dL (ref 0.57–1.00)
Glucose: 75 mg/dL (ref 70–99)
Potassium: 4.3 mmol/L (ref 3.5–5.2)
Sodium: 143 mmol/L (ref 134–144)
eGFR: 69 mL/min/{1.73_m2} (ref >59)

## 2021-04-16 ENCOUNTER — Other Ambulatory Visit (HOSPITAL_COMMUNITY): Payer: Self-pay

## 2021-04-18 ENCOUNTER — Other Ambulatory Visit (HOSPITAL_COMMUNITY): Payer: Self-pay

## 2021-04-18 MED ORDER — VALACYCLOVIR HCL 500 MG PO TABS
ORAL_TABLET | ORAL | 1 refills | Status: DC
  Filled 2021-04-18: qty 30, 30d supply, fill #0

## 2021-04-18 MED ORDER — MELOXICAM 15 MG PO TABS
ORAL_TABLET | ORAL | 1 refills | Status: DC
  Filled 2021-04-18: qty 30, 30d supply, fill #0

## 2021-04-21 DIAGNOSIS — E119 Type 2 diabetes mellitus without complications: Secondary | ICD-10-CM | POA: Diagnosis not present

## 2021-05-11 ENCOUNTER — Ambulatory Visit: Payer: 59 | Admitting: Neurology

## 2021-05-11 ENCOUNTER — Other Ambulatory Visit (HOSPITAL_BASED_OUTPATIENT_CLINIC_OR_DEPARTMENT_OTHER): Payer: Self-pay

## 2021-05-11 DIAGNOSIS — G5132 Clonic hemifacial spasm, left: Secondary | ICD-10-CM

## 2021-05-11 MED ORDER — ONABOTULINUMTOXINA 100 UNITS IJ SOLR
100.0000 [IU] | Freq: Once | INTRAMUSCULAR | Status: AC
  Administered 2021-05-11: 100 [IU] via INTRAMUSCULAR

## 2021-05-11 NOTE — Progress Notes (Signed)
PATIENT: Jaime Rodriguez DOB: November 30, 1950  Chief Complaint  Patient presents with   Procedure    BOTOX      HISTORICAL   Jaime Rodriguez is a 71 y.o.  right-handed black female referred by Dr. Jannifer Franklin for EMG guided botulinumtoxin injection for left facial spasm   She had a history of left-sided hemifacial spasm that began approximately a year ago in 2013. The patient was seen by Dr. Melton Alar, and she underwent MRI evaluation of the brain with and without gadolinium that was unremarkable. The patient underwent Botox injections in 2013,   She did well with the Botox, and the benefit lasted almost one year. She did has mild left facial droop after the injection.   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    She denies any balance issues, speech problems or swallowing problems. The patient denies any change in vision. She denies a previous history of left Bell's palsy.   In recent 1 year, since 2014, She began to have urinary urgency, she never had children in the past she denies bilateral upper and lower extremity paresthesia    UPDATE March 7th 2016:   Last injection was in Feb 2015, very helpful, did very well, she noticed recurrence of her left facial twitching, almost constant, she hoped to continue EMG guided xeomin injection for her left facial spasm,    UPDATE April 28th 2016: Her insurance refused xeomin, approved for Botox A 100 unit   UPDATE May 24 2015:She responded very well to previous Botox injection, came in for repeat injection again   Update September 14 2015: When I met her initially in 2014, she complains of urinary urgency, she has never had a vaginal delivery, her urinary urgency has been fairly stable, she denies gait difficulty, denies bowel and bladder incontinence, denied lower  extremity paresthesia, she wants to initiate evaluation, she does has hyperreflexia on examinations,.   Update May 16, 2017:   Last injection was in June 2017, she responded very well, now with frequent left hemifacial spasm again,   UPDATE Aug 29 2017: She responded very well to previous injection in Feb 2019, no significant side effect noticed   UPDATE December 05 2017: She did weill with previous injection on Aug 29 2017.  UPDATE Aug 21 2018: She responded very well to previous injection, there was no significant side effect noted.  UPDATE November 28 2018: She did well to previous injection   UPDATE Mar 13 2019: She did well with previous injection  UPDATE June 12 2019: She responded well to previous injection  Update December 17, 2019: She responded well to previous injection,  Update July 14, 2020: She responded well to previous injection, has much prolonged interval in between  UPDATE October 27 2020: She did notice return of left facial twitching at the end of 3 months  Update February 02, 2021: She responded well to previous injection, mild left facial asymmetry,  Update May 11, 2021: She responded very well, no significant side effect noted,  On exam: She has frequent left hemifacial muscle spasm, involving left orbicularis oculi, orbicularis oris, mild left cheek puff weakness. She has mild  left eye closure weakness.      Assessment/Plan:   Left hemifacial spasm, responded very well to previous EMG guided botulism toxin injection,  EMG guided Botox a injection use 100 units, dissolvable to 2 cc of normal saline, used 50 units, discard 50 units   Left orbicularis oculi, injection was placed at 2,3,4,5 6, 8,10   oclock (2.5 x 7=17.5  units ) Left frontalis 5 units Right frontalis 5 units  Left zygomaticus major 5 units Left levator labi superior 5 units   Right frontalis 5 units Right orbicularis oculi at 7, 8, 9:00 (2.5 unitsx3=7.5 units)        Marcial Pacas, M.D. Ph.D.   [facility] [place], [place] [address] Phone: [phone] Fax:      [phone]

## 2021-05-11 NOTE — Progress Notes (Signed)
Botox 100 units  [id number] [phone] Exp-06/2023 B/B

## 2021-05-18 ENCOUNTER — Other Ambulatory Visit (HOSPITAL_BASED_OUTPATIENT_CLINIC_OR_DEPARTMENT_OTHER): Payer: Self-pay

## 2021-05-25 ENCOUNTER — Other Ambulatory Visit (HOSPITAL_COMMUNITY): Payer: Self-pay

## 2021-05-30 ENCOUNTER — Telehealth: Payer: Self-pay | Admitting: Neurology

## 2021-05-30 NOTE — Telephone Encounter (Signed)
Received approval from [facility]. Reference # [id number] (05/10/2021-05/10/2022).

## 2021-06-14 ENCOUNTER — Other Ambulatory Visit: Payer: Self-pay | Admitting: Nurse Practitioner

## 2021-06-14 ENCOUNTER — Other Ambulatory Visit (HOSPITAL_COMMUNITY): Payer: Self-pay

## 2021-06-14 ENCOUNTER — Telehealth: Payer: Self-pay

## 2021-06-14 DIAGNOSIS — R7303 Prediabetes: Secondary | ICD-10-CM

## 2021-06-14 MED ORDER — METFORMIN HCL ER 750 MG PO TB24
750.0000 mg | ORAL_TABLET | Freq: Every day | ORAL | 1 refills | Status: DC
  Filled 2021-06-14: qty 90, 90d supply, fill #0
  Filled 2021-09-13: qty 90, 90d supply, fill #1

## 2021-06-14 NOTE — Telephone Encounter (Signed)
I called the patient because Laurance Flatten, DNP, FNP-BC wanted to see how often the patient takes her Meloxicam medication.  ?

## 2021-06-14 NOTE — Telephone Encounter (Signed)
The patient stated that she stopped using the meloxicam.  ?

## 2021-06-27 ENCOUNTER — Encounter: Payer: Self-pay | Admitting: Nurse Practitioner

## 2021-06-28 ENCOUNTER — Other Ambulatory Visit (HOSPITAL_COMMUNITY): Payer: Self-pay

## 2021-06-28 ENCOUNTER — Other Ambulatory Visit: Payer: Self-pay

## 2021-06-28 MED ORDER — FREESTYLE LITE W/DEVICE KIT
PACK | 1 refills | Status: AC
  Filled 2021-06-28: qty 1, 30d supply, fill #0

## 2021-07-07 DIAGNOSIS — E119 Type 2 diabetes mellitus without complications: Secondary | ICD-10-CM | POA: Diagnosis not present

## 2021-08-04 ENCOUNTER — Encounter: Payer: Self-pay | Admitting: Nurse Practitioner

## 2021-08-08 ENCOUNTER — Ambulatory Visit: Payer: 59 | Admitting: Neurology

## 2021-08-10 ENCOUNTER — Other Ambulatory Visit: Payer: Self-pay

## 2021-08-10 DIAGNOSIS — R7303 Prediabetes: Secondary | ICD-10-CM

## 2021-08-10 DIAGNOSIS — E782 Mixed hyperlipidemia: Secondary | ICD-10-CM

## 2021-08-10 DIAGNOSIS — Z79899 Other long term (current) drug therapy: Secondary | ICD-10-CM

## 2021-08-11 ENCOUNTER — Ambulatory Visit: Payer: 59 | Admitting: Nurse Practitioner

## 2021-08-11 ENCOUNTER — Other Ambulatory Visit (HOSPITAL_COMMUNITY): Payer: Self-pay

## 2021-08-11 ENCOUNTER — Other Ambulatory Visit: Payer: Self-pay | Admitting: Nurse Practitioner

## 2021-08-11 ENCOUNTER — Encounter: Payer: Self-pay | Admitting: Nurse Practitioner

## 2021-08-11 VITALS — BP 118/70 | HR 88 | Temp 97.8°F | Ht 60.0 in | Wt 101.0 lb

## 2021-08-11 DIAGNOSIS — R7303 Prediabetes: Secondary | ICD-10-CM | POA: Diagnosis not present

## 2021-08-11 DIAGNOSIS — Z23 Encounter for immunization: Secondary | ICD-10-CM

## 2021-08-11 DIAGNOSIS — E782 Mixed hyperlipidemia: Secondary | ICD-10-CM | POA: Diagnosis not present

## 2021-08-11 DIAGNOSIS — Z79899 Other long term (current) drug therapy: Secondary | ICD-10-CM | POA: Diagnosis not present

## 2021-08-11 DIAGNOSIS — W57XXXA Bitten or stung by nonvenomous insect and other nonvenomous arthropods, initial encounter: Secondary | ICD-10-CM | POA: Diagnosis not present

## 2021-08-11 DIAGNOSIS — S1096XA Insect bite of unspecified part of neck, initial encounter: Secondary | ICD-10-CM

## 2021-08-11 MED ORDER — LIVALO 4 MG PO TABS
1.0000 | ORAL_TABLET | Freq: Every day | ORAL | 1 refills | Status: DC
  Filled 2021-08-11: qty 90, 90d supply, fill #0
  Filled 2021-12-02: qty 90, 90d supply, fill #1

## 2021-08-11 NOTE — Patient Instructions (Addendum)
Prediabetes Eating Plan ?Prediabetes is a condition that causes blood sugar (glucose) levels to be higher than normal. This increases the risk for developing type 2 diabetes (type 2 diabetes mellitus). Working with a health care provider or nutrition specialist (dietitian) to make diet and lifestyle changes can help prevent the onset of diabetes. These changes may help you: ?Control your blood glucose levels. ?Improve your cholesterol levels. ?Manage your blood pressure. ?What are tips for following this plan? ?Reading food labels ?Read food labels to check the amount of fat, salt (sodium), and sugar in prepackaged foods. Avoid foods that have: ?Saturated fats. ?Trans fats. ?Added sugars. ?Avoid foods that have more than 300 milligrams (mg) of sodium per serving. Limit your sodium intake to less than 2,300 mg each day. ?Shopping ?Avoid buying pre-made and processed foods. ?Avoid buying drinks with added sugar. ?Cooking ?Cook with olive oil. Do not use butter, lard, or ghee. ?Bake, broil, grill, steam, or boil foods. Avoid frying. ?Meal planning ? ?Work with your dietitian to create an eating plan that is right for you. This may include tracking how many calories you take in each day. Use a food diary, notebook, or mobile application to track what you eat at each meal. ?Consider following a Mediterranean diet. This includes: ?Eating several servings of fresh fruits and vegetables each day. ?Eating fish at least twice a week. ?Eating one serving each day of whole grains, beans, nuts, and seeds. ?Using olive oil instead of other fats. ?Limiting alcohol. ?Limiting red meat. ?Using nonfat or low-fat dairy products. ?Consider following a plant-based diet. This includes dietary choices that focus on eating mostly vegetables and fruit, grains, beans, nuts, and seeds. ?If you have high blood pressure, you may need to limit your sodium intake or follow a diet such as the DASH (Dietary Approaches to Stop Hypertension) eating  plan. The DASH diet aims to lower high blood pressure. ?Lifestyle ?Set weight loss goals with help from your health care team. It is recommended that most people with prediabetes lose 7% of their body weight. ?Exercise for at least 30 minutes 5 or more days a week. ?Attend a support group or seek support from a mental health counselor. ?Take over-the-counter and prescription medicines only as told by your health care provider. ?What foods are recommended? ?Fruits ?Berries. Bananas. Apples. Oranges. Grapes. Papaya. Mango. Pomegranate. Kiwi. Grapefruit. Cherries. ?Vegetables ?Lettuce. Spinach. Peas. Beets. Cauliflower. Cabbage. Broccoli. Carrots. Tomatoes. Squash. Eggplant. Herbs. Peppers. Onions. Cucumbers. Brussels sprouts. ?Grains ?Whole grains, such as whole-wheat or whole-grain breads, crackers, cereals, and pasta. Unsweetened oatmeal. Bulgur. Barley. Quinoa. Brown rice. Corn or whole-wheat flour tortillas or taco shells. ?Meats and other proteins ?Seafood. Poultry without skin. Lean cuts of pork and beef. Tofu. Eggs. Nuts. Beans. ?Dairy ?Low-fat or fat-free dairy products, such as yogurt, cottage cheese, and cheese. ?Beverages ?Water. Tea. Coffee. Sugar-free or diet soda. Seltzer water. Low-fat or nonfat milk. Milk alternatives, such as soy or almond milk. ?Fats and oils ?Olive oil. Canola oil. Sunflower oil. Grapeseed oil. Avocado. Walnuts. ?Sweets and desserts ?Sugar-free or low-fat pudding. Sugar-free or low-fat ice cream and other frozen treats. ?Seasonings and condiments ?Herbs. Sodium-free spices. Mustard. Relish. Low-salt, low-sugar ketchup. Low-salt, low-sugar barbecue sauce. Low-fat or fat-free mayonnaise. ?The items listed above may not be a complete list of recommended foods and beverages. Contact a dietitian for more information. ?What foods are not recommended? ?Fruits ?Fruits canned with syrup. ?Vegetables ?Canned vegetables. Frozen vegetables with butter or cream sauce. ?Grains ?Refined white  flour and flour   products, such as bread, pasta, snack foods, and cereals. ?Meats and other proteins ?Fatty cuts of meat. Poultry with skin. Breaded or fried meat. Processed meats. ?Dairy ?Full-fat yogurt, cheese, or milk. ?Beverages ?Sweetened drinks, such as iced tea and soda. ?Fats and oils ?Butter. Lard. Ghee. ?Sweets and desserts ?Baked goods, such as cake, cupcakes, pastries, cookies, and cheesecake. ?Seasonings and condiments ?Spice mixes with added salt. Ketchup. Barbecue sauce. Mayonnaise. ?The items listed above may not be a complete list of foods and beverages that are not recommended. Contact a dietitian for more information. ?Where to find more information ?American Diabetes Association: www.diabetes.org ?Summary ?You may need to make diet and lifestyle changes to help prevent the onset of diabetes. These changes can help you control blood sugar, improve cholesterol levels, and manage blood pressure. ?Set weight loss goals with help from your health care team. It is recommended that most people with prediabetes lose 7% of their body weight. ?Consider following a Mediterranean diet. This includes eating plenty of fresh fruits and vegetables, whole grains, beans, nuts, seeds, fish, and low-fat dairy, and using olive oil instead of other fats. ?This information is not intended to replace advice given to you by your health care provider. Make sure you discuss any questions you have with your health care provider. ?Document Revised: 06/26/2019 Document Reviewed: 06/26/2019 ?Elsevier Patient Education ? [place]. ? ?Pneumococcal Conjugate Vaccine (Prevnar 20) Suspension for Injection ?What is this medication? ?PNEUMOCOCCAL VACCINE (NEU mo KOK al vak SEEN) is a vaccine. It prevents pneumococcus bacterial infections. These bacteria can cause serious infections like pneumonia, meningitis, and blood infections. This vaccine will not treat an infection and will not cause infection. This vaccine is  recommended for adults 18 years and older. ?This medicine may be used for other purposes; ask your health care provider or pharmacist if you have questions. ?COMMON BRAND NAME(S): Prevnar 20 ?What should I tell my care team before I take this medication? ?They need to know if you have any of these conditions: ?bleeding disorder ?fever ?immune system problems ?an unusual or allergic reaction to pneumococcal vaccine, diphtheria toxoid, other vaccines, other medicines, foods, dyes, or preservatives ?pregnant or trying to get pregnant ?breast-feeding ?How should I use this medication? ?This vaccine is injected into a muscle. It is given by a health care provider. ?A copy of Vaccine Information Statements will be given before each vaccination. Be sure to read this information carefully each time. This sheet may change often. ?Talk to your health care provider about the use of this medicine in children. Special care may be needed. ?Overdosage: If you think you have taken too much of this medicine contact a poison control center or emergency room at once. ?NOTE: This medicine is only for you. Do not share this medicine with others. ?What if I miss a dose? ?This does not apply. This medicine is not for regular use. ?What may interact with this medication? ?medicines for cancer chemotherapy ?medicines that suppress your immune function ?steroid medicines like prednisone or cortisone ?This list may not describe all possible interactions. Give your health care provider a list of all the medicines, herbs, non-prescription drugs, or dietary supplements you use. Also tell them if you smoke, drink alcohol, or use illegal drugs. Some items may interact with your medicine. ?What should I watch for while using this medication? ?Mild fever and pain should go away in 3 days or less. Report any unusual symptoms to your health care provider. ?What side effects may I notice  from receiving this medication? ?Side effects that you should  report to your doctor or health care professional as soon as possible: ?allergic reactions (skin rash, itching or hives; swelling of the face, lips, or tongue) ?confusion ?fast, irregular heartbeat ?fever over 102 de

## 2021-08-11 NOTE — Progress Notes (Signed)
I,Tianna Badgett,acting as a scribe for Janece Moore, FNP.,have documented all relevant documentation on the behalf of Janece Moore, FNP,as directed by  Janece Moore, FNP while in the presence of Janece Moore, FNP.  This visit occurred during the SARS-CoV-2 public health emergency.  Safety protocols were in place, including screening questions prior to the visit, additional usage of staff PPE, and extensive cleaning of exam room while observing appropriate contact time as indicated for disinfecting solutions.  Subjective:     Patient ID: Jaime Rodriguez , female    DOB: 08/10/1950 , 71 y.o.   MRN: [id number]   Chief Complaint  Patient presents with   Prediabetes    HPI  Pt here today for f/u on prediabetes and hyperlipidemia.  Hyperlipidemia This is a chronic problem. The problem is controlled. She has no history of chronic renal disease. Pertinent negatives include no chest pain. Current antihyperlipidemic treatment includes herbal therapy. Risk factors for coronary artery disease include a sedentary lifestyle and dyslipidemia.  Diabetes She presents for her follow-up diabetic visit. Diabetes type: prediabetes. Her disease course has been stable. There are no hypoglycemic associated symptoms. Pertinent negatives for hypoglycemia include no dizziness or headaches. There are no diabetic associated symptoms. Pertinent negatives for diabetes include no chest pain, no fatigue, no polydipsia, no polyphagia and no polyuria. There are no hypoglycemic complications. There are no diabetic complications. Risk factors for coronary artery disease include sedentary lifestyle. Current diabetic treatment includes oral agent (monotherapy). Her weight is stable. She is following a generally healthy (she does eat a good amount of cheese) diet. When asked about meal planning, she reported none. She has not had a previous visit with a dietitian. She rarely participates in exercise. (Blood sugars averaging 80-90) She  does not see a podiatrist.Eye exam is not current.    Past Medical History:  Diagnosis Date   Allergy    Diabetes mellitus    Fibrocystic disease of breast    LEFT BREAST SURGERY   Hemifacial spasm 12/18/2012   Left lower face   Hyperlipidemia    Postmenopausal      Family History  Problem Relation Age of Onset   Cancer Mother    Other Mother        sepsis   Stroke Father    Prostate cancer Brother    Cancer Maternal Grandmother    Asthma Sister    Hyperlipidemia Sister    Diabetes Sister    Cancer Brother    Hypertension Sister    Anxiety disorder Other    Diabetes Other    Hyperlipidemia Other      Current Outpatient Medications:    aspirin 81 MG tablet, Take 81 mg by mouth daily., Disp: , Rfl:    Blood Glucose Monitoring Suppl (FREESTYLE LITE) w/Device KIT, Use 2 times a day to test blood sugar before breakfast and dinner, Disp: 1 kit, Rfl: 1   botulinum toxin Type A (BOTOX) 100 units SOLR injection, Inject 100 Units into the muscle every 3 (three) months., Disp: , Rfl:    diclofenac sodium (VOLTAREN) 1 % GEL, Apply 2 g topically 4 (four) times daily as needed., Disp: 1 Tube, Rfl: 3   FREESTYLE LITE test strip, USE TWICE A DAY BEFORE BREAKFAST & DINNER AS DIRECTED, Disp: 100 strip, Rfl: 12   Lancets (FREESTYLE) lancets, USE TWICE A DAY BEFORE BREAKFAST & DINNER AS DIRECTED, Disp: 100 each, Rfl: 12   loratadine (CLARITIN) 10 MG tablet, Take 10 mg by mouth daily., Disp: ,   Rfl:    metFORMIN (GLUCOPHAGE-XR) 750 MG 24 hr tablet, Take 1 tablet (750 mg total) by mouth daily with breakfast., Disp: 90 tablet, Rfl: 1   Sodium Fluoride (FLUORIDE PO), Take by mouth. 0.2 Rinse, Disp: , Rfl:    SODIUM FLUORIDE, DENTAL RINSE, (PREVIDENT) 0.2 % SOLN, USE AS AN ORAL RINSE, AS DIRECTED ON PACKAGE, Disp: 473 mL, Rfl: 5   valACYclovir (VALTREX) 500 MG tablet, Take one tablet by mouth as directed, Disp: 30 tablet, Rfl: 1   Pitavastatin Calcium (LIVALO) 4 MG TABS, Take 1 tablet (4 mg total)  by mouth daily., Disp: 90 tablet, Rfl: 1   Allergies  Allergen Reactions   Penicillins      Review of Systems  Constitutional: Negative.  Negative for fatigue.  Respiratory: Negative.    Cardiovascular: Negative.  Negative for chest pain, palpitations and leg swelling.  Gastrointestinal: Negative.   Endocrine: Negative for polydipsia, polyphagia and polyuria.  Skin:  Positive for rash (posterior neck has 2 areas of redness). Negative for color change.  Neurological: Negative.  Negative for dizziness and headaches.  Psychiatric/Behavioral: Negative.      Today's Vitals   08/11/21 1507  BP: 118/70  Pulse: 88  Temp: 97.8 F (36.6 C)  TempSrc: Oral  Weight: 101 lb (45.8 kg)  Height: 5' (1.524 m)   Body mass index is 19.73 kg/m.  BP Readings from Last 3 Encounters:  08/24/21 (!) 145/77  08/11/21 118/70  04/13/21 110/72    Objective:  Physical Exam Vitals reviewed.  Constitutional:      General: She is not in acute distress.    Appearance: Normal appearance.  HENT:     Head: Normocephalic and atraumatic.  Eyes:     Pupils: Pupils are equal, round, and reactive to light.  Cardiovascular:     Rate and Rhythm: Normal rate and regular rhythm.     Pulses: Normal pulses.     Heart sounds: Normal heart sounds. No murmur heard. Pulmonary:     Effort: Pulmonary effort is normal. No respiratory distress.     Breath sounds: Normal breath sounds. No wheezing.  Musculoskeletal:        General: No tenderness.  Skin:    General: Skin is warm and dry.     Capillary Refill: Capillary refill takes less than 2 seconds.     Findings: No erythema (slight raised area to posterior neck).  Neurological:     General: No focal deficit present.     Mental Status: She is alert and oriented to person, place, and time.     Cranial Nerves: No cranial nerve deficit.  Psychiatric:        Mood and Affect: Mood normal.        Behavior: Behavior normal.        Thought Content: Thought content  normal.        Judgment: Judgment normal.        Assessment And Plan:     1. Prediabetes Comments: Stable, continue medications, tolerating well   2. Mixed hyperlipidemia Comments: Cholesterol levels have not changed much, continue statin, tolerating well - Pitavastatin Calcium (LIVALO) 4 MG TABS; Take 1 tablet (4 mg total) by mouth daily.  Dispense: 90 tablet; Refill: 1  3. Insect bite of neck, initial encounter Comments: Slight rasied area to back of neck, apply bacitracin to area.   4. Encounter for immunization Comments: Pneumonia 20 administered in office.  - Pneumococcal conjugate vaccine 20-valent (Prevnar 20)       Patient was given opportunity to ask questions. Patient verbalized understanding of the plan and was able to repeat key elements of the plan. All questions were answered to their satisfaction.  Janece Moore, FNP   I, Janece Moore, FNP, have reviewed all documentation for this visit. The documentation on 08/11/21 for the exam, diagnosis, procedures, and orders are all accurate and complete.   IF YOU HAVE BEEN REFERRED TO A SPECIALIST, IT MAY TAKE 1-2 WEEKS TO SCHEDULE/PROCESS THE REFERRAL. IF YOU HAVE NOT HEARD FROM US/SPECIALIST IN TWO WEEKS, PLEASE GIVE US A CALL AT [phone].   THE PATIENT IS ENCOURAGED TO PRACTICE SOCIAL DISTANCING DUE TO THE COVID-19 PANDEMIC.   

## 2021-08-12 LAB — BMP8+EGFR
BUN/Creatinine Ratio: 19 (ref 12–28)
BUN: 19 mg/dL (ref 8–27)
CO2: 25 mmol/L (ref 20–29)
Calcium: 9.5 mg/dL (ref 8.7–10.3)
Chloride: 104 mmol/L (ref 96–106)
Creatinine, Ser: 0.98 mg/dL (ref 0.57–1.00)
Glucose: 97 mg/dL (ref 70–99)
Potassium: 4.7 mmol/L (ref 3.5–5.2)
Sodium: 142 mmol/L (ref 134–144)
eGFR: 62 mL/min/{1.73_m2} (ref >59)

## 2021-08-12 LAB — LIPID PANEL
Chol/HDL Ratio: 2.7 ratio (ref 0.0–4.4)
Cholesterol, Total: 224 mg/dL — ABNORMAL HIGH (ref 100–199)
HDL: 84 mg/dL (ref >39)
LDL Chol Calc (NIH): 132 mg/dL — ABNORMAL HIGH (ref 0–99)
Triglycerides: 48 mg/dL (ref 0–149)
VLDL Cholesterol Cal: 8 mg/dL (ref 5–40)

## 2021-08-12 LAB — HEMOGLOBIN A1C
Est. average glucose Bld gHb Est-mCnc: 131 mg/dL
Hgb A1c MFr Bld: 6.2 % — ABNORMAL HIGH (ref 4.8–5.6)

## 2021-08-24 ENCOUNTER — Ambulatory Visit: Payer: 59 | Admitting: Neurology

## 2021-08-24 ENCOUNTER — Encounter: Payer: Self-pay | Admitting: Neurology

## 2021-08-24 VITALS — BP 145/77 | HR 89 | Ht 60.0 in | Wt 100.0 lb

## 2021-08-24 DIAGNOSIS — G5132 Clonic hemifacial spasm, left: Secondary | ICD-10-CM

## 2021-08-24 MED ORDER — ONABOTULINUMTOXINA 100 UNITS IJ SOLR
100.0000 [IU] | Freq: Once | INTRAMUSCULAR | Status: AC
  Administered 2021-08-24: 100 [IU] via INTRAMUSCULAR

## 2021-08-24 NOTE — Progress Notes (Signed)
Botox 100 units x 1 vial ?[id number] ?[id number] ?Exp-08/2023 ?B/B ?

## 2021-08-24 NOTE — Progress Notes (Signed)
? ? ?  PATIENT: Jaime Rodriguez ?DOB: 30-Mar-1951 ? ?Chief Complaint  ?Patient presents with  ? Procedure  ?  BOTOX  ?  ? ?HISTORICAL ? ? Jaime Rodriguez is a 71 y.o.  right-handed black female referred by Dr. Jannifer Franklin for EMG guided botulinumtoxin injection for left facial spasm ?  ?She had a history of left-sided hemifacial spasm that began approximately a year ago in 2013. The patient was seen by Dr. Melton Alar, and she underwent MRI evaluation of the brain with and without gadolinium that was unremarkable. The patient underwent Botox injections in 2013,   She did well with the Botox, and the benefit lasted almost one year. She did has mild left facial droop after the injection. ?  ?Over the past few months, she began to experience recurrent left facial spasm again, most bothersome symptoms is around her eyes, she works as a Psychologist, counselling, is causing social embarrassment, sometimes even block her left vision.  ?  ?She denies any balance issues, speech problems or swallowing problems. The patient denies any change in vision. She denies a previous history of left Bell's palsy. ?  ?In recent 1 year, since 2014, She began to have urinary urgency, she never had children in the past she denies bilateral upper and lower extremity paresthesia  ?  ?UPDATE March 7th 2016: ?  ?Last injection was in Feb 2015, very helpful, did very well, she noticed recurrence of her left facial twitching, almost constant, she hoped to continue EMG guided xeomin injection for her left facial spasm,  ?  ?Update Aug 24, 2021: ?She has been receiving EMG guided botulism toxin injection every 3 months, responding well, mild asymmetry with repeat injection, ? ? ? ?On exam: ?She has frequent left hemifacial muscle spasm, involving left orbicularis oculi, orbicularis oris, mild left cheek puff weakness. She has mild  left eye closure weakness.,  Asymmetry, low-lying left nasolabial fold ?  ?Assessment/Plan: ?  ?Left hemifacial spasm, responded very well to  previous EMG guided botulism toxin injection, ? ?  ?EMG guided Botox a injection use 100 units, dissolvable to 2 cc of normal saline, used 50 units, discard 50 units ?  ?Left orbicularis oculi, injection was placed at 2,3,4,5 6, 8,10   oclock (2.5 x 7=17.5  units ) ?Left frontalis 5 units ?Right frontalis 5 units ? ?Left zygomaticus major 5 units ?Left levator labi superior 5 units ? ? ?Right orbicularis oculi at 7, 8, 9:00 (2.5 unitsx3=7.5 units) ?Right zygomatic major 5 units ? ?   ?  ?Marcial Pacas, M.D. Ph.D. ?  ?[facility] ?[place], [place] [address] ?Phone: [phone] ?Fax:      [phone] ?  ? ?

## 2021-09-13 ENCOUNTER — Other Ambulatory Visit (HOSPITAL_COMMUNITY): Payer: Self-pay

## 2021-09-14 ENCOUNTER — Other Ambulatory Visit (HOSPITAL_COMMUNITY): Payer: Self-pay

## 2021-09-15 ENCOUNTER — Other Ambulatory Visit (HOSPITAL_COMMUNITY): Payer: Self-pay

## 2021-09-15 DIAGNOSIS — E119 Type 2 diabetes mellitus without complications: Secondary | ICD-10-CM | POA: Diagnosis not present

## 2021-09-16 ENCOUNTER — Other Ambulatory Visit (HOSPITAL_COMMUNITY): Payer: Self-pay

## 2021-11-22 ENCOUNTER — Ambulatory Visit: Payer: 59 | Admitting: Neurology

## 2021-11-23 ENCOUNTER — Ambulatory Visit: Payer: 59 | Admitting: Neurology

## 2021-11-23 ENCOUNTER — Encounter: Payer: Self-pay | Admitting: Neurology

## 2021-11-23 VITALS — BP 118/75 | HR 101 | Ht 60.0 in | Wt 102.0 lb

## 2021-11-23 DIAGNOSIS — G5132 Clonic hemifacial spasm, left: Secondary | ICD-10-CM

## 2021-11-23 NOTE — Progress Notes (Signed)
    PATIENT: Jaime Rodriguez DOB: [birth year]  Chief Complaint  Patient presents with   Procedure     Botox 100 units x 1 vial     HISTORICAL   Jaime Rodriguez is a 71 y.o.  right-handed black female referred by Dr. Anne Hahn for EMG guided botulinumtoxin injection for left facial spasm   She had a history of left-sided hemifacial spasm that began approximately a year ago in 2013. The patient was seen by Dr. Vela Prose, and she underwent MRI evaluation of the brain with and without gadolinium that was unremarkable. The patient underwent Botox injections in 2013,   She did well with the Botox, and the benefit lasted almost one year. She did has mild left facial droop after the injection.   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    She denies any balance issues, speech problems or swallowing problems. The patient denies any change in vision. She denies a previous history of left Bell's palsy.   In recent 1 year, since 2014, She began to have urinary urgency, she never had children in the past she denies bilateral upper and lower extremity paresthesia    UPDATE March 7th 2016:   Last injection was in Feb 2015, very helpful, did very well, she noticed recurrence of her left facial twitching, almost constant, she hoped to continue EMG guided xeomin injection for her left facial spasm,    Update Aug 24, 2021: She has been receiving EMG guided botulism toxin injection every 3 months, responding well, mild asymmetry with repeat injection,  Update November 23, 2021: She responded well to previous injection  On exam: She has frequent left hemifacial muscle spasm, involving left orbicularis oculi, orbicularis oris, mild left cheek puff weakness. She has mild  left eye closure weakness.,  Asymmetry, low-lying left  nasolabial fold   Assessment/Plan:   Left hemifacial spasm, responded very well to previous EMG guided botulism toxin injection,    EMG guided Botox a injection use 100 units, dissolvable to 2 cc of normal saline, used 45 units, discard 55 units   Left orbicularis oculi, injection was placed at 2,3,4,5 6, 8,10   oclock (2.5 x 7=17.5  units ) Left frontalis 5 units Right frontalis 5 units Left corrugate 5 units Right corrugate 5 units  Right orbicularis oculi at 7, 8, 9:00 (2.5 unitsx3=7.5 units)         Levert Feinstein, M.D. Ph.D.   [facility] [address] [place], [place] [address] Phone: [phone] Fax:      [phone]

## 2021-11-23 NOTE — Progress Notes (Signed)
Botox 100 units x 1 vial NDC: [id number] EXP: 2025/03 LOT: [id number]  B/B

## 2021-12-02 ENCOUNTER — Other Ambulatory Visit (HOSPITAL_COMMUNITY): Payer: Self-pay

## 2021-12-02 DIAGNOSIS — G5132 Clonic hemifacial spasm, left: Secondary | ICD-10-CM

## 2021-12-02 MED ORDER — ONABOTULINUMTOXINA 100 UNITS IJ SOLR
100.0000 [IU] | Freq: Once | INTRAMUSCULAR | Status: AC
  Administered 2021-12-02: 100 [IU] via INTRAMUSCULAR

## 2021-12-05 ENCOUNTER — Other Ambulatory Visit (HOSPITAL_COMMUNITY): Payer: Self-pay

## 2021-12-08 ENCOUNTER — Other Ambulatory Visit: Payer: Self-pay | Admitting: Nurse Practitioner

## 2021-12-08 ENCOUNTER — Other Ambulatory Visit (HOSPITAL_COMMUNITY): Payer: Self-pay

## 2021-12-08 DIAGNOSIS — R7303 Prediabetes: Secondary | ICD-10-CM

## 2021-12-08 MED ORDER — METFORMIN HCL ER 750 MG PO TB24
750.0000 mg | ORAL_TABLET | Freq: Every day | ORAL | 1 refills | Status: DC
  Filled 2021-12-08: qty 90, 90d supply, fill #0
  Filled 2022-03-09: qty 90, 90d supply, fill #1

## 2021-12-13 ENCOUNTER — Encounter: Payer: 59 | Admitting: Nurse Practitioner

## 2021-12-15 DIAGNOSIS — E119 Type 2 diabetes mellitus without complications: Secondary | ICD-10-CM | POA: Diagnosis not present

## 2021-12-20 ENCOUNTER — Other Ambulatory Visit: Payer: Self-pay | Admitting: Obstetrics and Gynecology

## 2021-12-20 DIAGNOSIS — Z1211 Encounter for screening for malignant neoplasm of colon: Secondary | ICD-10-CM | POA: Diagnosis not present

## 2021-12-20 DIAGNOSIS — Z681 Body mass index (BMI) 19 or less, adult: Secondary | ICD-10-CM | POA: Diagnosis not present

## 2021-12-20 DIAGNOSIS — M810 Age-related osteoporosis without current pathological fracture: Secondary | ICD-10-CM | POA: Diagnosis not present

## 2021-12-20 DIAGNOSIS — Z124 Encounter for screening for malignant neoplasm of cervix: Secondary | ICD-10-CM | POA: Diagnosis not present

## 2021-12-20 DIAGNOSIS — Z1231 Encounter for screening mammogram for malignant neoplasm of breast: Secondary | ICD-10-CM | POA: Diagnosis not present

## 2021-12-20 DIAGNOSIS — N764 Abscess of vulva: Secondary | ICD-10-CM | POA: Diagnosis not present

## 2021-12-20 DIAGNOSIS — Z01419 Encounter for gynecological examination (general) (routine) without abnormal findings: Secondary | ICD-10-CM | POA: Diagnosis not present

## 2021-12-21 ENCOUNTER — Ambulatory Visit (INDEPENDENT_AMBULATORY_CARE_PROVIDER_SITE_OTHER): Payer: 59 | Admitting: Nurse Practitioner

## 2021-12-21 ENCOUNTER — Encounter: Payer: Self-pay | Admitting: Nurse Practitioner

## 2021-12-21 VITALS — BP 120/60 | HR 80 | Temp 98.3°F | Ht 60.0 in | Wt 101.0 lb

## 2021-12-21 DIAGNOSIS — R7303 Prediabetes: Secondary | ICD-10-CM | POA: Diagnosis not present

## 2021-12-21 DIAGNOSIS — Z79899 Other long term (current) drug therapy: Secondary | ICD-10-CM

## 2021-12-21 DIAGNOSIS — E782 Mixed hyperlipidemia: Secondary | ICD-10-CM | POA: Diagnosis not present

## 2021-12-21 DIAGNOSIS — R109 Unspecified abdominal pain: Secondary | ICD-10-CM

## 2021-12-21 DIAGNOSIS — Z2821 Immunization not carried out because of patient refusal: Secondary | ICD-10-CM | POA: Diagnosis not present

## 2021-12-21 NOTE — Patient Instructions (Signed)

## 2021-12-21 NOTE — Progress Notes (Signed)
Barnet Glasgow Martin,acting as a Education administrator for [name], FNP.,have documented all relevant documentation on the behalf of [name], FNP,as directed by  [name], FNP while in the presence of [name], [place].   Subjective:     Patient ID: Jaime Rodriguez , female    DOB: Apr 25, 1950 , 71 y.o.   MRN: [id number]   Chief Complaint  Patient presents with   Annual Exam    HPI  Patient presents today for HM. Patient states she is having problems with her stomach. She states discomfort  after eating. She went to GYN yesterday - no PAP not due for 3 years. She had to get rescheduled from the dentist. She reports Dr. Collene Mares reported she had twisted bowel and a possible hernia. She notices when she eats meats.   BP Readings from Last 3 Encounters: 12/21/21 : 120/60 11/23/21 : 118/75 08/24/21 : Marland Kitchen 145/77       Past Medical History:  Diagnosis Date   Allergy    Diabetes mellitus    Fibrocystic disease of breast    LEFT BREAST SURGERY   Hemifacial spasm 12/18/2012   Left lower face   Hyperlipidemia    Postmenopausal      Family History  Problem Relation Age of Onset   Cancer Mother    Other Mother        sepsis   Stroke Father    Prostate cancer Brother    Cancer Maternal Grandmother    Asthma Sister    Hyperlipidemia Sister    Diabetes Sister    Cancer Brother    Hypertension Sister    Anxiety disorder Other    Diabetes Other    Hyperlipidemia Other      Current Outpatient Medications:    aspirin 81 MG tablet, Take 81 mg by mouth daily., Disp: , Rfl:    Blood Glucose Monitoring Suppl (FREESTYLE LITE) w/Device KIT, Use 2 times a day to test blood sugar before breakfast and dinner, Disp: 1 kit, Rfl: 1   botulinum toxin Type A (BOTOX) 100 units SOLR injection, Inject 100 Units into the muscle every 3 (three) months., Disp: , Rfl:    diclofenac sodium (VOLTAREN) 1 % GEL, Apply 2 g topically 4 (four) times daily as needed., Disp: 1 Tube, Rfl: 3   FREESTYLE LITE test  strip, USE TWICE A DAY BEFORE BREAKFAST & DINNER AS DIRECTED, Disp: 100 strip, Rfl: 12   Lancets (FREESTYLE) lancets, USE TWICE A DAY BEFORE BREAKFAST & DINNER AS DIRECTED, Disp: 100 each, Rfl: 12   loratadine (CLARITIN) 10 MG tablet, Take 10 mg by mouth daily., Disp: , Rfl:    metFORMIN (GLUCOPHAGE-XR) 750 MG 24 hr tablet, Take 1 tablet (750 mg total) by mouth daily with breakfast., Disp: 90 tablet, Rfl: 1   Pitavastatin Calcium (LIVALO) 4 MG TABS, Take 1 tablet (4 mg total) by mouth daily., Disp: 90 tablet, Rfl: 1   Sodium Fluoride (FLUORIDE PO), Take by mouth. 0.2 Rinse, Disp: , Rfl:    SODIUM FLUORIDE, DENTAL RINSE, (PREVIDENT) 0.2 % SOLN, USE AS AN ORAL RINSE, AS DIRECTED ON PACKAGE, Disp: 473 mL, Rfl: 5   valACYclovir (VALTREX) 500 MG tablet, Take one tablet by mouth as directed, Disp: 30 tablet, Rfl: 1   Allergies  Allergen Reactions   Penicillins       The patient states she is post menopausal status.   No LMP recorded. Patient is postmenopausal.. Negative for Dysmenorrhea and Negative for Menorrhagia. Negative for: breast discharge,  breast lump(s), breast pain and breast self exam. Associated symptoms include abnormal vaginal bleeding. Pertinent negatives include abnormal bleeding (hematology), anxiety, decreased libido, depression, difficulty falling sleep, dyspareunia, history of infertility, nocturia, sexual dysfunction, sleep disturbances, urinary incontinence, urinary urgency, vaginal discharge and vaginal itching. Diet regular.  The patient states her exercise level is moderate with Zumba 2-3 times a week.   . The patient's tobacco use is:  Social History   Tobacco Use  Smoking Status Never  Smokeless Tobacco Never   She has been exposed to passive smoke. The patient's alcohol use is:  Social History   Substance and Sexual Activity  Alcohol Use No    Review of Systems  Constitutional: Negative.   HENT: Negative.    Eyes: Negative.   Respiratory: Negative.     Cardiovascular: Negative.   Gastrointestinal:  Positive for abdominal pain (after eating).  Endocrine: Negative.   Genitourinary: Negative.   Musculoskeletal: Negative.   Skin: Negative.   Allergic/Immunologic: Negative.   Neurological: Negative.   Hematological: Negative.   Psychiatric/Behavioral: Negative.       Today's Vitals   12/21/21 1453  BP: 120/60  Pulse: 80  Temp: 98.3 F (36.8 C)  TempSrc: Oral  Weight: 101 lb (45.8 kg)  Height: 5' (1.524 m)  PainSc: 0-No pain   Body mass index is 19.73 kg/m.   Objective:  Physical Exam Constitutional:      General: She is not in acute distress.    Appearance: Normal appearance. She is well-developed.  HENT:     Head: Normocephalic and atraumatic.     Right Ear: Hearing, tympanic membrane, ear canal and external ear normal. There is no impacted cerumen.     Left Ear: Hearing, tympanic membrane, ear canal and external ear normal. There is no impacted cerumen.     Nose:     Comments: Deferred - masked    Mouth/Throat:     Comments: Deferred - masked Eyes:     General: Lids are normal.     Extraocular Movements: Extraocular movements intact.     Conjunctiva/sclera: Conjunctivae normal.     Pupils: Pupils are equal, round, and reactive to light.     Funduscopic exam:    Right eye: No papilledema.        Left eye: No papilledema.  Neck:     Thyroid: No thyroid mass.     Vascular: No carotid bruit.  Cardiovascular:     Rate and Rhythm: Normal rate and regular rhythm.     Pulses: Normal pulses.     Heart sounds: Normal heart sounds. No murmur heard. Pulmonary:     Effort: Pulmonary effort is normal. No respiratory distress.     Breath sounds: Normal breath sounds. No wheezing.  Abdominal:     General: Abdomen is flat. Bowel sounds are normal. There is no distension.     Palpations: Abdomen is soft.     Tenderness: There is no abdominal tenderness.  Musculoskeletal:        General: No swelling or tenderness. Normal  range of motion.     Cervical back: Full passive range of motion without pain, normal range of motion and neck supple.     Right lower leg: No edema.     Left lower leg: No edema.  Skin:    General: Skin is warm and dry.     Capillary Refill: Capillary refill takes less than 2 seconds.  Neurological:     General: No focal deficit present.  Mental Status: She is alert and oriented to person, place, and time.     Cranial Nerves: No cranial nerve deficit.     Sensory: No sensory deficit.     Motor: No weakness.     Gait: Gait normal.  Psychiatric:        Mood and Affect: Mood normal.        Behavior: Behavior normal.        Thought Content: Thought content normal.        Judgment: Judgment normal.         Assessment And Plan:     1. Prediabetes Comments: HgbA1c is stable, continue focusing on healthy diet and regular exercise.  - Hemoglobin A1c  2. Mixed hyperlipidemia Comments: Cholesterol levels have been slightly improved at last visit. Continue statin, tolerating well.  - CMP14+EGFR - Lipid panel  3. Influenza vaccination declined Patient declined influenza vaccination at this time. Patient is aware that influenza vaccine prevents illness in 70% of healthy people, and reduces hospitalizations to 30-70% in elderly. This vaccine is recommended annually. Education has been provided regarding the importance of this vaccine but patient still declined. Advised may receive this vaccine at local pharmacy or Health Dept.or vaccine clinic. Aware to provide a copy of the vaccination record if obtained from local pharmacy or Health Dept.  Pt is willing to accept risk associated with refusing vaccination.  4. Stomach discomfort Comments: She is to take a probiotic and avoid spicy and fatty foods.  She is followed by Dr. Collene Mares  5. Other long term (current) drug therapy - CBC no Diff   Patient was given opportunity to ask questions. Patient verbalized understanding of the plan and was  able to repeat key elements of the plan. All questions were answered to their satisfaction.   [name], FNP    I, [name], FNP, have reviewed all documentation for this visit. The documentation on 12/21/21 for the exam, diagnosis, procedures, and orders are all accurate and complete.  THE PATIENT IS ENCOURAGED TO PRACTICE SOCIAL DISTANCING DUE TO THE COVID-19 PANDEMIC.

## 2021-12-23 DIAGNOSIS — Z79899 Other long term (current) drug therapy: Secondary | ICD-10-CM | POA: Diagnosis not present

## 2021-12-24 ENCOUNTER — Encounter: Payer: Self-pay | Admitting: Nurse Practitioner

## 2021-12-24 LAB — CBC
Hematocrit: 35.1 % (ref 34.0–46.6)
Hemoglobin: 11.4 g/dL (ref 11.1–15.9)
MCH: 31.1 pg (ref 26.6–33.0)
MCHC: 32.5 g/dL (ref 31.5–35.7)
MCV: 96 fL (ref 79–97)
Platelets: 272 10*3/uL (ref 150–450)
RBC: 3.66 x10E6/uL — ABNORMAL LOW (ref 3.77–5.28)
RDW: 11.9 % (ref 11.7–15.4)
WBC: 5.1 10*3/uL (ref 3.4–10.8)

## 2021-12-30 ENCOUNTER — Other Ambulatory Visit: Payer: Self-pay | Admitting: Nurse Practitioner

## 2021-12-30 DIAGNOSIS — E782 Mixed hyperlipidemia: Secondary | ICD-10-CM | POA: Diagnosis not present

## 2021-12-30 LAB — HEMOGLOBIN A1C
Est. average glucose Bld gHb Est-mCnc: 131 mg/dL
Hgb A1c MFr Bld: 6.2 % — ABNORMAL HIGH (ref 4.8–5.6)

## 2021-12-30 LAB — CMP14+EGFR
ALT: 12 IU/L (ref 0–32)
AST: 20 IU/L (ref 0–40)
Albumin/Globulin Ratio: 1.7 (ref 1.2–2.2)
Albumin: 4.5 g/dL (ref 3.8–4.8)
Alkaline Phosphatase: 95 IU/L (ref 44–121)
BUN/Creatinine Ratio: 21 (ref 12–28)
BUN: 19 mg/dL (ref 8–27)
Bilirubin Total: <0.2 mg/dL (ref 0.0–1.2)
CO2: 25 mmol/L (ref 20–29)
Calcium: 9.7 mg/dL (ref 8.7–10.3)
Chloride: 102 mmol/L (ref 96–106)
Creatinine, Ser: 0.89 mg/dL (ref 0.57–1.00)
Globulin, Total: 2.7 g/dL (ref 1.5–4.5)
Glucose: 86 mg/dL (ref 70–99)
Potassium: 4.5 mmol/L (ref 3.5–5.2)
Sodium: 140 mmol/L (ref 134–144)
Total Protein: 7.2 g/dL (ref 6.0–8.5)
eGFR: 69 mL/min/{1.73_m2} (ref >59)

## 2021-12-30 LAB — LIPID PANEL
Chol/HDL Ratio: 2.5 ratio (ref 0.0–4.4)
Cholesterol, Total: 210 mg/dL — ABNORMAL HIGH (ref 100–199)
HDL: 83 mg/dL (ref >39)
LDL Chol Calc (NIH): 118 mg/dL — ABNORMAL HIGH (ref 0–99)
Triglycerides: 47 mg/dL (ref 0–149)
VLDL Cholesterol Cal: 9 mg/dL (ref 5–40)

## 2022-01-18 ENCOUNTER — Ambulatory Visit (HOSPITAL_COMMUNITY)
Admission: RE | Admit: 2022-01-18 | Discharge: 2022-01-18 | Disposition: A | Discharge status: Home / Self Care | Payer: 59 | Source: Ambulatory Visit | Attending: Obstetrics and Gynecology | Admitting: Obstetrics and Gynecology

## 2022-01-18 DIAGNOSIS — M810 Age-related osteoporosis without current pathological fracture: Secondary | ICD-10-CM

## 2022-02-02 ENCOUNTER — Ambulatory Visit
Admission: EM | Admit: 2022-02-02 | Discharge: 2022-02-02 | Disposition: A | Discharge status: Home / Self Care | Payer: 59 | Attending: Family Medicine | Admitting: Family Medicine

## 2022-02-02 ENCOUNTER — Other Ambulatory Visit: Payer: Self-pay

## 2022-02-02 ENCOUNTER — Encounter: Payer: Self-pay | Admitting: Emergency Medicine

## 2022-02-02 ENCOUNTER — Ambulatory Visit: Payer: 59

## 2022-02-02 DIAGNOSIS — R55 Syncope and collapse: Secondary | ICD-10-CM | POA: Diagnosis not present

## 2022-02-02 DIAGNOSIS — R42 Dizziness and giddiness: Secondary | ICD-10-CM

## 2022-02-02 LAB — POCT FASTING CBG KUC MANUAL ENTRY: POCT Glucose (KUC): 77 mg/dL (ref 70–99)

## 2022-02-02 MED ORDER — MECLIZINE HCL 12.5 MG PO TABS: 12.5000 mg | ORAL_TABLET | Freq: Three times a day (TID) | ORAL | 0 refills | Status: DC | PRN

## 2022-02-02 NOTE — ED Provider Notes (Signed)
[facility]    CSN: [id number] Arrival date & time: 02/02/22  1633      History   Chief Complaint Chief Complaint  Patient presents with   Dizziness    HPI Jaime Rodriguez is a 71 y.o. female.   Patient presenting today with an episode of room spinning dizziness that feels similar to past episodes of vertigo yesterday resolved by half tablet of meclizine.  She then had another episode several hours ago again resolved with meclizine.  Just prior to that episode while she was working she had a short bout of feeling hot and diaphoretic and felt like she was going to fall over but did not.  She states that her work wants to get her checked out before she is cleared to come back.  She states she did not fall, did not hit her head and did not have any visual or mental status changes, chest pain, shortness of breath, dizziness during that incident and that it has not recurred since spontaneously resolving shortly after onset.  Did eat breakfast this morning as usual and no new medications or supplements.  Feeling at her baseline health currently.  No known cardiac history.    Past Medical History:  Diagnosis Date   Allergy    Diabetes mellitus    Fibrocystic disease of breast    LEFT BREAST SURGERY   Hemifacial spasm 12/18/2012   Left lower face   Hyperlipidemia    Postmenopausal     Patient Active Problem List   Diagnosis Date Noted   Mixed hyperlipidemia 04/19/2018   Prediabetes 04/19/2018   Urinary urgency 06/06/2013   Hyperreflexia 06/06/2013   Facial nerve spasm 12/18/2012   Hemifacial spasm 12/18/2012   Menopausal symptoms 10/23/2011    Past Surgical History:  Procedure Laterality Date   BREAST LUMPECTOMY     Benign lesion    OB History     Gravida  0   Para  0   Term  0   Preterm  0   AB  0   Living  0      SAB  0   IAB  0   Ectopic  0   Multiple  0   Live Births  0            Home Medications    Prior to Admission  medications   Medication Sig Start Date End Date Taking? Authorizing Provider  meclizine (ANTIVERT) 12.5 MG tablet Take 1 tablet (12.5 mg total) by mouth 3 (three) times daily as needed for dizziness. 02/02/22  Yes Volney American, PA-C  aspirin 81 MG tablet Take 81 mg by mouth daily.    [provider]  Blood Glucose Monitoring Suppl (FREESTYLE LITE) w/Device KIT Use 2 times a day to test blood sugar before breakfast and dinner 06/28/21   [name], FNP  botulinum toxin Type A (BOTOX) 100 units SOLR injection Inject 100 Units into the muscle every 3 (three) months.    [provider]  diclofenac sodium (VOLTAREN) 1 % GEL Apply 2 g topically 4 (four) times daily as needed. 04/19/18   [name], FNP  FREESTYLE LITE test strip USE TWICE A DAY BEFORE BREAKFAST & DINNER AS DIRECTED 02/12/19   [name], FNP  Lancets (FREESTYLE) lancets USE TWICE A DAY BEFORE BREAKFAST & DINNER AS DIRECTED 02/12/19   [name], FNP  loratadine (CLARITIN) 10 MG tablet Take 10 mg by mouth daily.    [provider]  metFORMIN (GLUCOPHAGE-XR) 750 MG 24 hr tablet Take 1 tablet (750 mg total) by mouth daily with breakfast. 12/08/21   [name], FNP  Pitavastatin Calcium (LIVALO) 4 MG TABS Take 1 tablet (4 mg total) by mouth daily. 08/11/21   [name], FNP  Sodium Fluoride (FLUORIDE PO) Take by mouth. 0.2 Rinse    [provider]  SODIUM FLUORIDE, DENTAL RINSE, (PREVIDENT) 0.2 % SOLN USE AS AN ORAL RINSE, AS DIRECTED ON PACKAGE 09/16/20     valACYclovir (VALTREX) 500 MG tablet Take one tablet by mouth as directed 04/18/21   [name], FNP    Family History Family History  Problem Relation Age of Onset   Cancer Mother    Other Mother        sepsis   Stroke Father    Prostate cancer Brother    Cancer Maternal Grandmother    Asthma Sister    Hyperlipidemia Sister    Diabetes Sister    Cancer Brother    Hypertension Sister    Anxiety disorder Other     Diabetes Other    Hyperlipidemia Other     Social History Social History   Tobacco Use   Smoking status: Never   Smokeless tobacco: Never  Vaping Use   Vaping Use: Never used  Substance Use Topics   Alcohol use: No   Drug use: No     Allergies   Penicillins   Review of Systems Review of Systems Per HPI  Physical Exam Triage Vital Signs ED Triage Vitals  Enc Vitals Group     BP 02/02/22 1645 119/77     Pulse Rate 02/02/22 1645 82     Resp 02/02/22 1645 18     Temp 02/02/22 1645 98.1 F (36.7 C)     Temp Source 02/02/22 1645 Oral     SpO2 02/02/22 1645 98 %     Weight --      Height --      Head Circumference --      Peak Flow --      Pain Score 02/02/22 1642 0     Pain Loc --      Pain Edu? --      Excl. in [place]? --    No data found.  Updated Vital Signs BP 119/77 (BP Location: Right Arm)   Pulse 82   Temp 98.1 F (36.7 C) (Oral)   Resp 18   SpO2 98%   Visual Acuity Right Eye Distance:   Left Eye Distance:   Bilateral Distance:    Right Eye Near:   Left Eye Near:    Bilateral Near:     Physical Exam Vitals and nursing note reviewed.  Constitutional:      Appearance: Normal appearance. She is not ill-appearing.  HENT:     Head: Atraumatic.     Mouth/Throat:     Mouth: Mucous membranes are moist.  Eyes:     Extraocular Movements: Extraocular movements intact.     Conjunctiva/sclera: Conjunctivae normal.     Pupils: Pupils are equal, round, and reactive to light.  Cardiovascular:     Rate and Rhythm: Normal rate and regular rhythm.     Heart sounds: Normal heart sounds.  Pulmonary:     Effort: Pulmonary effort is normal.     Breath sounds: Normal breath sounds. No wheezing or rales.  Musculoskeletal:        General: Normal range of motion.     Cervical back: Normal range of  motion and neck supple.  Skin:    General: Skin is warm and dry.  Neurological:     General: No focal deficit present.     Mental Status: She is alert and  oriented to person, place, and time. Mental status is at baseline.     Cranial Nerves: No cranial nerve deficit.     Motor: No weakness.     Gait: Gait normal.  Psychiatric:        Mood and Affect: Mood normal.        Thought Content: Thought content normal.        Judgment: Judgment normal.      UC Treatments / Results  Labs (all labs ordered are listed, but only abnormal results are displayed) Labs Reviewed  POCT FASTING CBG [facility] MANUAL ENTRY - Normal    EKG   Radiology No results found.  Procedures Procedures (including critical care time)  Medications Ordered in UC Medications - No data to display  Initial Impression / Assessment and Plan / UC Course  I have reviewed the triage vital signs and the nursing notes.  Pertinent labs & imaging results that were available during my care of the patient were reviewed by me and considered in my medical decision making (see chart for details).     Vital signs, exam very reassuring and without abnormalities.  EKG today showing normal sinus rhythm at 84 bpm without ST or T wave changes acutely, point-of-care glucose within normal limits random.  Possibly a slight vasovagal incident earlier, discussed if symptoms are recurring or worsening go to the emergency department for further evaluation.  Regarding her vertigo episodes, will refill meclizine, provided a packet of Epley maneuvers and provide several days off work in hopes that that will resolve.  Follow-up with PCP for recheck.  Final Clinical Impressions(s) / UC Diagnoses   Final diagnoses:  Vertigo  Near syncope   Discharge Instructions   None    ED Prescriptions     Medication Sig Dispense Auth. Provider   meclizine (ANTIVERT) 12.5 MG tablet Take 1 tablet (12.5 mg total) by mouth 3 (three) times daily as needed for dizziness. 30 tablet Volney American, [place]      PDMP not reviewed this encounter.   Volney American, [place] 02/02/22 [phone]

## 2022-02-02 NOTE — ED Triage Notes (Signed)
Vertigo started yesterday, history of the same.  Patient takes only a half tablet of meclizine for symptoms  Patient had a near fall episode at work and was told she needed to be evaluated prior to returning to work.  Patient reports this was related to the dizziness.  Denies pain.  Dizziness comes and goes

## 2022-02-15 ENCOUNTER — Ambulatory Visit: Payer: 59 | Admitting: Neurology

## 2022-02-15 ENCOUNTER — Other Ambulatory Visit: Payer: Self-pay

## 2022-02-15 VITALS — BP 144/75 | HR 88 | Ht 60.0 in | Wt 101.0 lb

## 2022-02-15 DIAGNOSIS — G5132 Clonic hemifacial spasm, left: Secondary | ICD-10-CM | POA: Diagnosis not present

## 2022-02-15 MED ORDER — ONABOTULINUMTOXINA 100 UNITS IJ SOLR
100.0000 [IU] | Freq: Once | INTRAMUSCULAR | Status: AC
  Administered 2022-02-15: 100 [IU] via INTRAMUSCULAR

## 2022-02-15 MED ORDER — ONABOTULINUMTOXINA 100 UNITS IJ SOLR: 100.0000 [IU] | Freq: Once | INTRAMUSCULAR | Status: DC

## 2022-02-15 MED ORDER — COVID-19 MRNA 2023-2024 VACCINE (COMIRNATY) 0.3 ML INJECTION
0.3000 mL | Freq: Once | INTRAMUSCULAR | 0 refills | Status: AC
  Filled 2022-02-15: qty 0.3, 1d supply, fill #0

## 2022-02-15 NOTE — Progress Notes (Signed)
Botox 100 units x 1 vial [id number] Exp-06/2024 B/B 

## 2022-02-15 NOTE — Progress Notes (Signed)
    PATIENT: Jaime Rodriguez DOB: 08-Sep-1950  Chief Complaint  Patient presents with   Procedure    BOTOX 100 units     HISTORICAL   Jaime Rodriguez is a 71 y.o.  right-handed black female referred by Dr. Anne Hahn for EMG guided botulinumtoxin injection for left facial spasm   She had a history of left-sided hemifacial spasm that began approximately a year ago in 2013. The patient was seen by Dr. Vela Prose, and she underwent MRI evaluation of the brain with and without gadolinium that was unremarkable. The patient underwent Botox injections in 2013,   She did well with the Botox, and the benefit lasted almost one year. She did has mild left facial droop after the injection.   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    She denies any balance issues, speech problems or swallowing problems. The patient denies any change in vision. She denies a previous history of left Bell's palsy.   In recent 1 year, since 2014, She began to have urinary urgency, she never had children in the past she denies bilateral upper and lower extremity paresthesia    UPDATE March 7th 2016:   Last injection was in Feb 2015, very helpful, did very well, she noticed recurrence of her left facial twitching, almost constant, she hoped to continue EMG guided xeomin injection for her left facial spasm,    Update Aug 24, 2021: She has been receiving EMG guided botulism toxin injection every 3 months, responding well, mild asymmetry with repeat injection,  Update November 23, 2021: She responded well to previous injection  UPDATE Feb 15 2022: She did well with previous injection  On exam: She has frequent left hemifacial muscle spasm, involving left orbicularis oculi, orbicularis oris, mild left cheek puff weakness. She has mild  left  eye closure weakness.,  Asymmetry, low-lying left nasolabial fold   Assessment/Plan:   Left hemifacial spasm, responded very well to previous EMG guided botulism toxin injection,    EMG guided Botox a injection use 100 units, dissolvable to 2 cc of normal saline, used 47.5 units, discard 52.5 units   Left orbicularis oculi, injection was placed at 2,3,4,5 6, 8,   oclock (2.5 x 6=15 units ) Right frontalis 10 units Left corrugate 5 units Right corrugate 5 units Left zygomaticus major 5 units  Right orbicularis oculi at 7, 8, 9:00 (2.5 unitsx3=7.5 units)         Levert Feinstein, M.D. Ph.D.   [facility] [address] [place], [place] [address] Phone: [phone] Fax:      [phone]

## 2022-02-16 ENCOUNTER — Other Ambulatory Visit: Payer: Self-pay

## 2022-02-17 ENCOUNTER — Other Ambulatory Visit (HOSPITAL_COMMUNITY): Payer: Self-pay

## 2022-02-17 MED ORDER — PREVIDENT 0.2 % MT SOLN
OROMUCOSAL | 5 refills | Status: AC
  Filled 2022-02-17 – 2022-04-11 (×2): qty 473, 30d supply, fill #0

## 2022-02-22 ENCOUNTER — Other Ambulatory Visit (HOSPITAL_COMMUNITY): Payer: Self-pay

## 2022-02-23 DIAGNOSIS — L84 Corns and callosities: Secondary | ICD-10-CM | POA: Diagnosis not present

## 2022-02-23 DIAGNOSIS — E119 Type 2 diabetes mellitus without complications: Secondary | ICD-10-CM | POA: Diagnosis not present

## 2022-03-09 ENCOUNTER — Other Ambulatory Visit (HOSPITAL_COMMUNITY): Payer: Self-pay

## 2022-03-09 ENCOUNTER — Other Ambulatory Visit: Payer: Self-pay | Admitting: Nurse Practitioner

## 2022-03-09 DIAGNOSIS — E782 Mixed hyperlipidemia: Secondary | ICD-10-CM

## 2022-03-09 MED ORDER — PITAVASTATIN CALCIUM 4 MG PO TABS
1.0000 | ORAL_TABLET | Freq: Every day | ORAL | 1 refills | Status: DC
  Filled 2022-03-09: qty 90, 90d supply, fill #0
  Filled 2022-07-03: qty 90, 90d supply, fill #1

## 2022-04-11 ENCOUNTER — Other Ambulatory Visit (HOSPITAL_COMMUNITY): Payer: Self-pay

## 2022-04-11 ENCOUNTER — Other Ambulatory Visit: Payer: Self-pay

## 2022-04-12 ENCOUNTER — Other Ambulatory Visit (HOSPITAL_COMMUNITY): Payer: Self-pay

## 2022-05-05 ENCOUNTER — Telehealth: Payer: Self-pay | Admitting: Neurology

## 2022-05-05 NOTE — Telephone Encounter (Signed)
Patient needs a botox PA started with new [employer] insurance please. Scheduled for 1/31  CPT [id number]. PA is requesting 100 units of Botox every 12 weeks for dx G51.32

## 2022-05-07 ENCOUNTER — Other Ambulatory Visit: Payer: Self-pay | Admitting: Nurse Practitioner

## 2022-05-08 ENCOUNTER — Other Ambulatory Visit (HOSPITAL_COMMUNITY): Payer: Self-pay

## 2022-05-08 ENCOUNTER — Other Ambulatory Visit: Payer: Self-pay

## 2022-05-08 MED ORDER — LORATADINE 10 MG PO TABS
10.0000 mg | ORAL_TABLET | Freq: Every day | ORAL | 1 refills | Status: DC
  Filled 2022-05-08: qty 30, 30d supply, fill #0
  Filled 2022-06-13: qty 30, 30d supply, fill #1

## 2022-05-09 ENCOUNTER — Other Ambulatory Visit (HOSPITAL_COMMUNITY): Payer: Self-pay

## 2022-05-09 NOTE — Telephone Encounter (Signed)
Pharmacy Patient Advocate Encounter   Received notification from [place] that prior authorization for Botox 100UNIT solution is required/requested.    PA submitted on 05/09/2022 to (ins) [employer] via CoverMyMeds Key [id number]  Status is pending

## 2022-05-10 ENCOUNTER — Encounter: Payer: Self-pay | Admitting: Neurology

## 2022-05-10 ENCOUNTER — Ambulatory Visit: Payer: Commercial Managed Care - PPO | Admitting: Neurology

## 2022-05-10 ENCOUNTER — Other Ambulatory Visit (HOSPITAL_COMMUNITY): Payer: Self-pay

## 2022-05-10 VITALS — BP 132/79 | HR 92 | Ht 60.0 in | Wt 101.0 lb

## 2022-05-10 DIAGNOSIS — G5132 Clonic hemifacial spasm, left: Secondary | ICD-10-CM | POA: Diagnosis not present

## 2022-05-10 MED ORDER — ONABOTULINUMTOXINA 100 UNITS IJ SOLR
100.0000 [IU] | Freq: Once | INTRAMUSCULAR | Status: AC
  Administered 2022-05-10: 100 [IU] via INTRAMUSCULAR

## 2022-05-10 NOTE — Progress Notes (Signed)
.  Botox 100 units x 1 vial [id number] Lot-09/2024 Exp-09/2024 B/B

## 2022-05-10 NOTE — Progress Notes (Signed)
    PATIENT: Jaime Rodriguez DOB: October 09, 1950  Chief Complaint  Patient presents with   Procedure     HISTORICAL   Jaime Rodriguez is a 72 y.o.  right-handed black female referred by Dr. Jannifer Franklin for EMG guided botulinumtoxin injection for left facial spasm   She had a history of left-sided hemifacial spasm that began approximately a year ago in 2013. The patient was seen by Dr. Melton Alar, and she underwent MRI evaluation of the brain with and without gadolinium that was unremarkable. The patient underwent Botox injections in 2013,   She did well with the Botox, and the benefit lasted almost one year. She did has mild left facial droop after the injection.   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    She denies any balance issues, speech problems or swallowing problems. The patient denies any change in vision. She denies a previous history of left Bell's palsy.   In recent 1 year, since 2014, She began to have urinary urgency, she never had children in the past she denies bilateral upper and lower extremity paresthesia    UPDATE March 7th 2016:   Last injection was in Feb 2015, very helpful, did very well, she noticed recurrence of her left facial twitching, almost constant, she hoped to continue EMG guided xeomin injection for her left facial spasm,    Update Aug 24, 2021: She has been receiving EMG guided botulism toxin injection every 3 months, responding well, mild asymmetry with repeat injection,  Update November 23, 2021: She responded well to previous injection  UPDATE Feb 15 2022: She did well with previous injection  On exam: She has frequent left hemifacial muscle spasm, involving left orbicularis oculi, orbicularis oris, mild left cheek puff weakness. She has mild  left eye closure weakness.,   Asymmetry, low-lying left nasolabial fold   Assessment/Plan:   Left hemifacial spasm, responded very well to previous EMG guided botulism toxin injection,    EMG guided Botox a injection use 100 units, dissolvable to 2 cc of normal saline,   2.5 units at each injection sites  (2.5x20)=50 units   Marcial Pacas, M.D. Ph.D.   [facility] [place], [place] [address] Phone: [phone] Fax:      [phone]

## 2022-05-10 NOTE — Telephone Encounter (Signed)
BotoxOne-Benefit Verification [id number] Submitted!

## 2022-05-15 ENCOUNTER — Other Ambulatory Visit (HOSPITAL_COMMUNITY): Payer: Self-pay

## 2022-05-16 ENCOUNTER — Other Ambulatory Visit (HOSPITAL_COMMUNITY): Payer: Self-pay

## 2022-05-16 ENCOUNTER — Encounter: Payer: Self-pay | Admitting: Nurse Practitioner

## 2022-05-16 NOTE — Telephone Encounter (Signed)
Monica, do you know if she would be eligible for a copay card?

## 2022-05-16 NOTE — Telephone Encounter (Signed)
Pharmacy Patient Advocate Encounter  Prior Authorization for Botox 100UNIT solution has been approved.    PA# PA Case ID: [id number] Effective dates: 05/09/2022 through 05/09/2023  Per test billing copay is $750.00  This can be filled with [facility]

## 2022-05-17 ENCOUNTER — Encounter: Payer: Self-pay | Admitting: Nurse Practitioner

## 2022-05-17 ENCOUNTER — Ambulatory Visit: Payer: Commercial Managed Care - PPO | Admitting: Nurse Practitioner

## 2022-05-17 ENCOUNTER — Other Ambulatory Visit (HOSPITAL_COMMUNITY): Payer: Self-pay

## 2022-05-17 VITALS — BP 108/62 | HR 90 | Temp 97.9°F | Ht 60.0 in | Wt 100.8 lb

## 2022-05-17 DIAGNOSIS — R21 Rash and other nonspecific skin eruption: Secondary | ICD-10-CM | POA: Diagnosis not present

## 2022-05-17 MED ORDER — CLINDAMYCIN HCL 150 MG PO CAPS
150.0000 mg | ORAL_CAPSULE | Freq: Four times a day (QID) | ORAL | 0 refills | Status: DC
  Filled 2022-05-17: qty 28, 7d supply, fill #0

## 2022-05-17 MED ORDER — HYDROCORTISONE 1 % EX CREA
TOPICAL_CREAM | CUTANEOUS | 1 refills | Status: DC
  Filled 2022-05-17: qty 30, fill #0

## 2022-05-17 NOTE — Progress Notes (Signed)
I,Jaime Rodriguez,acting as a Education administrator for Jaime Brine, FNP.,have documented all relevant documentation on the behalf of Jaime Brine, FNP,as directed by  Jaime Brine, FNP while in the presence of Jaime Rodriguez, [place].    Subjective:     Patient ID: Jaime Rodriguez , female    DOB: [birth year] , 72 y.o.   MRN: [id number]   Chief Complaint  Patient presents with   Rash    RLE, since last week, increasing in size, does report occasional itching, denies burning/irritation/drainage    HPI  Patient presents today for rash on her right lower leg. The area has been increasing in size since last week. Over the weekend the rash worsened with Monday being the worse.  She does report occasional itching, denies burning/irritation/drainage.       Past Medical History:  Diagnosis Date   Allergy    Diabetes mellitus    Fibrocystic disease of breast    LEFT BREAST SURGERY   Hemifacial spasm 12/18/2012   Left lower face   Hyperlipidemia    Postmenopausal      Family History  Problem Relation Age of Onset   Cancer Mother    Other Mother        sepsis   Stroke Father    Prostate cancer Brother    Cancer Maternal Grandmother    Asthma Sister    Hyperlipidemia Sister    Diabetes Sister    Cancer Brother    Hypertension Sister    Anxiety disorder Other    Diabetes Other    Hyperlipidemia Other      Current Outpatient Medications:    aspirin 81 MG tablet, Take 81 mg by mouth daily., Disp: , Rfl:    Blood Glucose Monitoring Suppl (FREESTYLE LITE) w/Device KIT, Use 2 times a day to test blood sugar before breakfast and dinner, Disp: 1 kit, Rfl: 1   botulinum toxin Type A (BOTOX) 100 units SOLR injection, Inject 100 Units into the muscle every 3 (three) months., Disp: , Rfl:    clindamycin (CLEOCIN) 150 MG capsule, Take 1 capsule (150 mg total) by mouth 4 (four) times daily., Disp: 28 capsule, Rfl: 0   diclofenac sodium (VOLTAREN) 1 % GEL, Apply 2 g topically 4 (four) times daily as needed.,  Disp: 1 Tube, Rfl: 3   FREESTYLE LITE test strip, USE TWICE A DAY BEFORE BREAKFAST & DINNER AS DIRECTED, Disp: 100 strip, Rfl: 12   hydrocortisone cream 1 %, Apply to affected area 2 times daily, Disp: 30 g, Rfl: 1   Lancets (FREESTYLE) lancets, USE TWICE A DAY BEFORE BREAKFAST & DINNER AS DIRECTED, Disp: 100 each, Rfl: 12   loratadine (CLARITIN) 10 MG tablet, Take 1 tablet (10 mg total) by mouth daily., Disp: 30 tablet, Rfl: 1   meclizine (ANTIVERT) 12.5 MG tablet, Take 1 tablet (12.5 mg total) by mouth 3 (three) times daily as needed for dizziness., Disp: 30 tablet, Rfl: 0   metFORMIN (GLUCOPHAGE-XR) 750 MG 24 hr tablet, Take 1 tablet (750 mg total) by mouth daily with breakfast., Disp: 90 tablet, Rfl: 1   Pitavastatin Calcium (LIVALO) 4 MG TABS, Take 1 tablet (4 mg total) by mouth daily., Disp: 90 tablet, Rfl: 1   SODIUM FLUORIDE, DENTAL RINSE, (PREVIDENT) 0.2 % SOLN, USE AS AN ORAL RINSE, AS DIRECTED ON PACKAGE, Disp: 473 mL, Rfl: 5   valACYclovir (VALTREX) 500 MG tablet, Take one tablet by mouth as directed, Disp: 30 tablet, Rfl: 1   Allergies  Allergen Reactions  Penicillins      Review of Systems  Respiratory: Negative.    Cardiovascular: Negative.   Skin:  Positive for rash (right lower leg).  Psychiatric/Behavioral: Negative.       Today's Vitals   05/17/22 1426  BP: 108/62  Pulse: 90  Temp: 97.9 F (36.6 C)  TempSrc: Oral  SpO2: 99%  Weight: 100 lb 12.8 oz (45.7 kg)  Height: 5' (1.524 m)   Body mass index is 19.69 kg/m.   Objective:  Physical Exam Vitals reviewed.  Constitutional:      General: She is not in acute distress.    Appearance: Normal appearance.  HENT:     Head: Normocephalic and atraumatic.  Eyes:     Pupils: Pupils are equal, round, and reactive to light.  Pulmonary:     Effort: Pulmonary effort is normal. No respiratory distress.  Musculoskeletal:        General: No tenderness.  Skin:    General: Skin is warm and dry.     Capillary  Refill: Capillary refill takes less than 2 seconds.     Findings: Rash (right anterior lower extremity with erythematous non blanchable rash. Slightly raised area and slightly hot to touch.) present. No erythema.  Neurological:     General: No focal deficit present.     Mental Status: She is alert and oriented to person, place, and time.     Cranial Nerves: No cranial nerve deficit.     Motor: No weakness.  Psychiatric:        Mood and Affect: Mood normal.        Behavior: Behavior normal.        Thought Content: Thought content normal.        Judgment: Judgment normal.         Assessment And Plan:     1. Rash and nonspecific skin eruption Comments: Cellulitis vs eczema, if not better at completion of antibiotic return call to office. Calf measurements are 10.5 cm. - hydrocortisone cream 1 %; Apply to affected area 2 times daily  Dispense: 30 g; Refill: 1 - clindamycin (CLEOCIN) 150 MG capsule; Take 1 capsule (150 mg total) by mouth 4 (four) times daily.  Dispense: 28 capsule; Refill: 0    Patient was given opportunity to ask questions. Patient verbalized understanding of the plan and was able to repeat key elements of the plan. All questions were answered to their satisfaction.  Jaime Brine, FNP   I, Jaime Brine, FNP, have reviewed all documentation for this visit. The documentation on 05/17/22 for the exam, diagnosis, procedures, and orders are all accurate and complete.   IF YOU HAVE BEEN REFERRED TO A SPECIALIST, IT MAY TAKE 1-2 WEEKS TO SCHEDULE/PROCESS THE REFERRAL. IF YOU HAVE NOT HEARD FROM US/SPECIALIST IN TWO WEEKS, PLEASE GIVE [place] A CALL AT [phone].   THE PATIENT IS ENCOURAGED TO PRACTICE SOCIAL DISTANCING DUE TO THE COVID-19 PANDEMIC.

## 2022-05-17 NOTE — Patient Instructions (Signed)
Rash, Adult A rash is a change in the color of your skin. A rash can also change the way your skin feels. There are many different conditions and factors that can cause a rash. Some rashes may disappear after a few days, but some may last for a few weeks. Common causes of rashes include: Viral infections, such as: Colds. Measles. Hand, foot, and mouth disease. Bacterial infections, such as: Scarlet fever. Impetigo. Fungal infections, such as Candida. Allergic reactions to food, medicines, or skin care products. Follow these instructions at home: The goal of treatment is to stop the itching and keep the rash from spreading. Pay attention to any changes in your symptoms. Follow these instructions to help with your condition: Medicine Take or apply over-the-counter and prescription medicines only as told by your health care provider. These may include: Corticosteroid creams to treat red or swollen skin. Anti-itch lotions. Oral allergy medicines (antihistamines). Oral corticosteroids for severe symptoms.  Skin care Apply cool compresses to the affected areas. Do not scratch or rub your skin. Avoid covering the rash. Make sure the rash is exposed to air as much as possible. Managing itching and discomfort Avoid hot showers or baths, which can make itching worse. A cold shower may help. Try taking a bath with: Epsom salts. Follow manufacturer instructions on the packaging. You can get these at your local pharmacy or grocery store. Baking soda. Pour a small amount into the bath as told by your health care provider. Colloidal oatmeal. Follow manufacturer instructions on the packaging. You can get this at your local pharmacy or grocery store. Try applying baking soda paste to your skin. Stir water into baking soda until it reaches a paste-like consistency. Try applying calamine lotion. This is an over-the-counter lotion that helps to relieve itchiness. Keep cool and out of the sun. Sweating  and being hot can make itching worse. General instructions  Rest as needed. Drink enough fluid to keep your urine pale yellow. Wear loose-fitting clothing. Avoid scented soaps, detergents, and perfumes. Use gentle soaps, detergents, perfumes, and other cosmetic products. Avoid any substance that causes your rash. Keep a journal to help track what causes your rash. Write down: What you eat. What cosmetic products you use. What you drink. What you wear. This includes jewelry. Keep all follow-up visits as told by your health care provider. This is important. Contact a health care provider if: You sweat at night. You lose weight. You urinate more than normal. You urinate less than normal, or you notice that your urine is a darker color than usual. You feel weak. You vomit. Your skin or the whites of your eyes look yellow (jaundice). Your skin: Tingles. Is numb. Your rash: Does not go away after several days. Gets worse. You are: Unusually thirsty. More tired than normal. You have: New symptoms. Pain in your abdomen. A fever. Diarrhea. Get help right away if you: Have a fever and your symptoms suddenly get worse. Develop confusion. Have a severe headache or a stiff neck. Have severe joint pains or stiffness. Have a seizure. Develop a rash that covers all or most of your body. The rash may or may not be painful. Develop blisters that: Are on top of the rash. Grow larger or grow together. Are painful. Are inside your nose or mouth. Develop a rash that: Looks like purple pinprick-sized spots all over your body. Has a "bull's eye" or looks like a target. Is not related to sun exposure, is red and painful, and causes   your skin to peel. Summary A rash is a change in the color of your skin. Some rashes disappear after a few days, but some may last for a few weeks. The goal of treatment is to stop the itching and keep the rash from spreading. Take or apply over-the-counter  and prescription medicines only as told by your health care provider. Contact a health care provider if you have new or worsening symptoms. Keep all follow-up visits as told by your health care provider. This is important. This information is not intended to replace advice given to you by your health care provider. Make sure you discuss any questions you have with your health care provider. Document Revised: 09/27/2021 Document Reviewed: 01/06/2021 Elsevier Patient Education  2023 Elsevier Inc.  

## 2022-05-17 NOTE — Telephone Encounter (Signed)
  This card has also been uploaded into [facility] with [place].

## 2022-05-25 ENCOUNTER — Other Ambulatory Visit (HOSPITAL_COMMUNITY): Payer: Self-pay

## 2022-05-25 NOTE — Telephone Encounter (Signed)
I submitted a PA thru PT medical benefits on [employer]-on 05/25/22-PA is pending-will update once the insurance has made a decision.

## 2022-05-29 NOTE — Telephone Encounter (Signed)
   Pharmacy Patient Advocate Encounter  Prior Authorization for Botox 200 units has been approved.    PA# [id number] per [employer] Effective dates: 05/25/2022 through 05/25/2023 This will be buy and [employer]

## 2022-06-01 DIAGNOSIS — L84 Corns and callosities: Secondary | ICD-10-CM | POA: Diagnosis not present

## 2022-06-01 DIAGNOSIS — E119 Type 2 diabetes mellitus without complications: Secondary | ICD-10-CM | POA: Diagnosis not present

## 2022-06-07 ENCOUNTER — Other Ambulatory Visit: Payer: Self-pay

## 2022-06-07 ENCOUNTER — Other Ambulatory Visit: Payer: Self-pay | Admitting: Nurse Practitioner

## 2022-06-07 ENCOUNTER — Other Ambulatory Visit (HOSPITAL_COMMUNITY): Payer: Self-pay

## 2022-06-07 DIAGNOSIS — R7303 Prediabetes: Secondary | ICD-10-CM

## 2022-06-07 MED ORDER — METFORMIN HCL ER 750 MG PO TB24
750.0000 mg | ORAL_TABLET | Freq: Every day | ORAL | 1 refills | Status: DC
  Filled 2022-06-07: qty 90, 90d supply, fill #0
  Filled 2022-09-08: qty 90, 90d supply, fill #1

## 2022-06-13 ENCOUNTER — Other Ambulatory Visit (HOSPITAL_COMMUNITY): Payer: Self-pay

## 2022-06-17 ENCOUNTER — Encounter: Payer: Self-pay | Admitting: Nurse Practitioner

## 2022-07-03 ENCOUNTER — Other Ambulatory Visit (HOSPITAL_COMMUNITY): Payer: Self-pay

## 2022-07-04 ENCOUNTER — Ambulatory Visit: Payer: Commercial Managed Care - PPO | Admitting: Nurse Practitioner

## 2022-07-10 ENCOUNTER — Encounter: Payer: Self-pay | Admitting: Nurse Practitioner

## 2022-07-10 ENCOUNTER — Ambulatory Visit: Payer: Commercial Managed Care - PPO | Admitting: Nurse Practitioner

## 2022-07-10 VITALS — BP 108/58 | HR 88 | Temp 98.0°F | Ht 60.0 in | Wt 101.0 lb

## 2022-07-10 DIAGNOSIS — H9190 Unspecified hearing loss, unspecified ear: Secondary | ICD-10-CM | POA: Insufficient documentation

## 2022-07-10 DIAGNOSIS — H6121 Impacted cerumen, right ear: Secondary | ICD-10-CM | POA: Insufficient documentation

## 2022-07-10 DIAGNOSIS — H918X3 Other specified hearing loss, bilateral: Secondary | ICD-10-CM | POA: Diagnosis not present

## 2022-07-10 NOTE — Progress Notes (Signed)
I,Jaime Rodriguez,acting as a Education administrator for Jaime Brine, FNP.,have documented all relevant documentation on the behalf of Jaime Brine, FNP,as directed by  Jaime Brine, FNP while in the presence of Jaime Rodriguez, [place].    Subjective:     Patient ID: Jaime Rodriguez , female    DOB: [birth year] , 72 y.o.   MRN: [id number]   Chief Complaint  Patient presents with   Hearing Problem    HPI  Patient presents today for concern about her hearing. She went to [facility] for a hearing test and they recommend she see an ENT. She is not sure what the outcome of her hearing test was. Patient denies pain or other symptoms in her ears. She does report chronic slight hearing loss in both ears but feels it is getting worse.   She had hearing loss back in 1990's but has since worsened in the last year. She is having to turn the television up and unable to hear at church.       Past Medical History:  Diagnosis Date   Allergy    Diabetes mellitus    Fibrocystic disease of breast    LEFT BREAST SURGERY   Hemifacial spasm 12/18/2012   Left lower face   Hyperlipidemia    Postmenopausal      Family History  Problem Relation Age of Onset   Cancer Mother    Other Mother        sepsis   Stroke Father    Prostate cancer Brother    Cancer Maternal Grandmother    Asthma Sister    Hyperlipidemia Sister    Diabetes Sister    Cancer Brother    Hypertension Sister    Anxiety disorder Other    Diabetes Other    Hyperlipidemia Other      Current Outpatient Medications:    aspirin 81 MG tablet, Take 81 mg by mouth daily., Disp: , Rfl:    Blood Glucose Monitoring Suppl (FREESTYLE LITE) w/Device KIT, Use 2 times a day to test blood sugar before breakfast and dinner, Disp: 1 kit, Rfl: 1   botulinum toxin Type A (BOTOX) 100 units SOLR injection, Inject 100 Units into the muscle every 3 (three) months., Disp: , Rfl:    clindamycin (CLEOCIN) 150 MG capsule, Take 1 capsule (150 mg total) by mouth 4 (four)  times daily., Disp: 28 capsule, Rfl: 0   diclofenac sodium (VOLTAREN) 1 % GEL, Apply 2 g topically 4 (four) times daily as needed., Disp: 1 Tube, Rfl: 3   FREESTYLE LITE test strip, USE TWICE A DAY BEFORE BREAKFAST & DINNER AS DIRECTED, Disp: 100 strip, Rfl: 12   hydrocortisone cream 1 %, Apply to affected area 2 times daily, Disp: 30 g, Rfl: 1   Lancets (FREESTYLE) lancets, USE TWICE A DAY BEFORE BREAKFAST & DINNER AS DIRECTED, Disp: 100 each, Rfl: 12   loratadine (CLARITIN) 10 MG tablet, Take 1 tablet (10 mg total) by mouth daily., Disp: 30 tablet, Rfl: 1   meclizine (ANTIVERT) 12.5 MG tablet, Take 1 tablet (12.5 mg total) by mouth 3 (three) times daily as needed for dizziness., Disp: 30 tablet, Rfl: 0   metFORMIN (GLUCOPHAGE-XR) 750 MG 24 hr tablet, Take 1 tablet (750 mg total) by mouth daily with breakfast., Disp: 90 tablet, Rfl: 1   Pitavastatin Calcium (LIVALO) 4 MG TABS, Take 1 tablet (4 mg total) by mouth daily., Disp: 90 tablet, Rfl: 1   SODIUM FLUORIDE, DENTAL RINSE, (PREVIDENT) 0.2 % SOLN, USE  AS AN ORAL RINSE, AS DIRECTED ON PACKAGE, Disp: 473 mL, Rfl: 5   valACYclovir (VALTREX) 500 MG tablet, Take one tablet by mouth as directed, Disp: 30 tablet, Rfl: 1   Allergies  Allergen Reactions   Penicillins      Review of Systems  HENT:  Positive for hearing loss (both ears).      Today's Vitals   07/10/22 1629  BP: (!) 108/58  Pulse: 88  Temp: 98 F (36.7 C)  TempSrc: Oral  SpO2: 99%  Weight: 101 lb (45.8 kg)  Height: 5' (1.524 m)   Body mass index is 19.73 kg/m.   Objective:  Physical Exam Vitals reviewed.  Constitutional:      General: She is not in acute distress.    Appearance: Normal appearance.  HENT:     Head: Normocephalic and atraumatic.     Right Ear: Ear canal and external ear normal. There is no impacted cerumen.     Left Ear: Ear canal and external ear normal. There is impacted cerumen (semi soft cerumen to ear canal removed with lighted curette).      Ears:     Comments: She does have scarring noted to bilateral TM Pulmonary:     Effort: Pulmonary effort is normal. No respiratory distress.  Musculoskeletal:        General: No tenderness.  Skin:    General: Skin is warm and dry.     Capillary Refill: Capillary refill takes less than 2 seconds.     Findings: No erythema or rash.  Neurological:     General: No focal deficit present.     Mental Status: She is alert and oriented to person, place, and time.     Cranial Nerves: No cranial nerve deficit.     Motor: No weakness.  Psychiatric:        Mood and Affect: Mood normal.        Behavior: Behavior normal.        Thought Content: Thought content normal.        Judgment: Judgment normal.      Hearing Screening   500Hz  1000Hz  2000Hz  4000Hz   Right ear Fail Pass Fail Fail  Left ear Fail Pass Pass Fail    Assessment And Plan:     1. Other specified hearing loss of both ears Comments: She failed all levels except for right and left ear at 1000 hz and left ear at 2000 hz. Will refer to ENT for further evaluation - Ambulatory referral to ENT  2. Excessive cerumen in right ear canal Comments: removed large amount of cerumen from right ear with lighted curette.    Patient was given opportunity to ask questions. Patient verbalized understanding of the plan and was able to repeat key elements of the plan. All questions were answered to their satisfaction.  Jaime Brine, FNP   I, Jaime Brine, FNP, have reviewed all documentation for this visit. The documentation on 07/10/22 for the exam, diagnosis, procedures, and orders are all accurate and complete.   IF YOU HAVE BEEN REFERRED TO A SPECIALIST, IT MAY TAKE 1-2 WEEKS TO SCHEDULE/PROCESS THE REFERRAL. IF YOU HAVE NOT HEARD FROM US/SPECIALIST IN TWO WEEKS, PLEASE GIVE [place] A CALL AT [phone].   THE PATIENT IS ENCOURAGED TO PRACTICE SOCIAL DISTANCING DUE TO THE COVID-19 PANDEMIC.

## 2022-07-20 ENCOUNTER — Telehealth: Payer: Self-pay | Admitting: Neurology

## 2022-07-20 NOTE — Telephone Encounter (Signed)
Called pt. LVM to please call office.  

## 2022-07-20 NOTE — Telephone Encounter (Signed)
Pt would like a call to discuss applying for the Botox savings plan, she would like a call from RN to discuss that further

## 2022-07-21 ENCOUNTER — Other Ambulatory Visit: Payer: Self-pay | Admitting: Nurse Practitioner

## 2022-07-21 ENCOUNTER — Other Ambulatory Visit: Payer: Self-pay

## 2022-07-21 ENCOUNTER — Other Ambulatory Visit (HOSPITAL_COMMUNITY): Payer: Self-pay

## 2022-07-21 MED ORDER — LORATADINE 10 MG PO TABS
10.0000 mg | ORAL_TABLET | Freq: Every day | ORAL | 1 refills | Status: DC
  Filled 2022-07-21: qty 30, 30d supply, fill #0
  Filled 2022-09-01: qty 30, 30d supply, fill #1

## 2022-07-25 NOTE — Telephone Encounter (Signed)
Pt requesting Botox information be mailed to her.

## 2022-07-26 ENCOUNTER — Other Ambulatory Visit (HOSPITAL_COMMUNITY): Payer: Self-pay

## 2022-08-02 ENCOUNTER — Ambulatory Visit: Payer: Commercial Managed Care - PPO | Admitting: Neurology

## 2022-08-02 VITALS — BP 129/69

## 2022-08-02 DIAGNOSIS — G5132 Clonic hemifacial spasm, left: Secondary | ICD-10-CM

## 2022-08-02 MED ORDER — ONABOTULINUMTOXINA 100 UNITS IJ SOLR
100.0000 [IU] | Freq: Once | INTRAMUSCULAR | Status: AC
  Administered 2022-08-02: 100 [IU] via INTRAMUSCULAR

## 2022-08-02 NOTE — Progress Notes (Signed)
.  Botox 100 units x 1 vial [id number] Exp-09/2024 B/B    Bacteriostatic 0.9% Sodium Chloride- 4 mL total Lot: [id number] Expiration: 02/2024 NDC: 86578-469-62 Dx: X52.84 Witnessed by Athena Masse RN

## 2022-08-02 NOTE — Progress Notes (Signed)
    PATIENT: Jaime Rodriguez DOB: 01/08/1951  Chief Complaint  Patient presents with   Procedure    RM 14, ALONE BOTOX INJECTION. PT IS WELL AND AGREEABLE TO INJECTION     HISTORICAL   Jaime Rodriguez is a 72 y.o.  right-handed black female referred by Dr. Anne Hahn for EMG guided botulinumtoxin injection for left facial spasm   She had a history of left-sided hemifacial spasm that began approximately a year ago in 2013. The patient was seen by Dr. Vela Prose, and she underwent MRI evaluation of the brain with and without gadolinium that was unremarkable. The patient underwent Botox injections in 2013,   She did well with the Botox, and the benefit lasted almost one year. She did has mild left facial droop after the injection.   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    She denies any balance issues, speech problems or swallowing problems. The patient denies any change in vision. She denies a previous history of left Bell's palsy.   In recent 1 year, since 2014, She began to have urinary urgency, she never had children in the past she denies bilateral upper and lower extremity paresthesia    UPDATE March 7th 2016:   Last injection was in Feb 2015, very helpful, did very well, she noticed recurrence of her left facial twitching, almost constant, she hoped to continue EMG guided xeomin injection for her left facial spasm,    Update Aug 24, 2021: She has been receiving EMG guided botulism toxin injection every 3 months, responding well, mild asymmetry with repeat injection,  Update November 23, 2021: She responded well to previous injection  UPDATE Feb 15 2022: She did well with previous injection  UPDATE August 02 2022: She did well with previous injection  On exam: She has frequent left hemifacial  muscle spasm, involving left orbicularis oculi, orbicularis oris, mild left cheek puff weakness. She has mild  left eye closure weakness.,  Asymmetry, low-lying left nasolabial fold   Assessment/Plan:   Left hemifacial spasm, responded very well to previous EMG guided botulism toxin injection,    EMG guided Botox a injection  100 units, dissolvable to 2 cc of normal saline,   2.5 units at each injection sites  (2.5x 16 )=40 units, discard 60 units   Levert Feinstein, M.D. Ph.D.    [facility] [address] [place], [place] [address] Phone: [phone] Fax:      [phone]

## 2022-08-03 DIAGNOSIS — H903 Sensorineural hearing loss, bilateral: Secondary | ICD-10-CM | POA: Diagnosis not present

## 2022-08-10 DIAGNOSIS — L84 Corns and callosities: Secondary | ICD-10-CM | POA: Diagnosis not present

## 2022-08-10 DIAGNOSIS — E119 Type 2 diabetes mellitus without complications: Secondary | ICD-10-CM | POA: Diagnosis not present

## 2022-09-01 ENCOUNTER — Other Ambulatory Visit (HOSPITAL_COMMUNITY): Payer: Self-pay

## 2022-09-08 ENCOUNTER — Other Ambulatory Visit (HOSPITAL_COMMUNITY): Payer: Self-pay

## 2022-09-22 ENCOUNTER — Telehealth: Payer: Self-pay | Admitting: Neurology

## 2022-09-22 NOTE — Telephone Encounter (Signed)
Pt said, Botox Saving Program have not received any insurance information from [employer] regarding Botox. They're wanting to verify which insurance [employer] is using. Would like a call back or can contact the program with the insurance information.

## 2022-09-25 ENCOUNTER — Encounter: Payer: Self-pay | Admitting: Neurology

## 2022-09-25 NOTE — Telephone Encounter (Signed)
Sent pt a Clinical cytogeneticist message informing her of the insurance we have on file.

## 2022-09-27 NOTE — Telephone Encounter (Signed)
If someone from Botox Savings calls in, please let them know that all we have on file for pt is [employer].

## 2022-10-12 ENCOUNTER — Other Ambulatory Visit: Payer: Self-pay | Admitting: Nurse Practitioner

## 2022-10-13 ENCOUNTER — Other Ambulatory Visit (HOSPITAL_COMMUNITY): Payer: Self-pay

## 2022-10-13 ENCOUNTER — Other Ambulatory Visit: Payer: Self-pay

## 2022-10-13 MED ORDER — LORATADINE 10 MG PO TABS
10.0000 mg | ORAL_TABLET | Freq: Every day | ORAL | 1 refills | Status: DC
  Filled 2022-10-13: qty 30, 30d supply, fill #0
  Filled 2022-11-17: qty 30, 30d supply, fill #1

## 2022-10-24 DIAGNOSIS — H903 Sensorineural hearing loss, bilateral: Secondary | ICD-10-CM | POA: Diagnosis not present

## 2022-10-25 ENCOUNTER — Other Ambulatory Visit: Payer: Self-pay | Admitting: Nurse Practitioner

## 2022-10-25 ENCOUNTER — Other Ambulatory Visit (HOSPITAL_COMMUNITY): Payer: Self-pay

## 2022-10-25 DIAGNOSIS — E782 Mixed hyperlipidemia: Secondary | ICD-10-CM

## 2022-10-25 MED ORDER — PITAVASTATIN CALCIUM 4 MG PO TABS
1.0000 | ORAL_TABLET | Freq: Every day | ORAL | 1 refills | Status: DC
  Filled 2022-10-25: qty 90, 90d supply, fill #0
  Filled 2023-02-27: qty 90, 90d supply, fill #1

## 2022-11-08 ENCOUNTER — Ambulatory Visit: Payer: Commercial Managed Care - PPO | Admitting: Neurology

## 2022-11-08 VITALS — BP 127/61

## 2022-11-08 DIAGNOSIS — G5132 Clonic hemifacial spasm, left: Secondary | ICD-10-CM | POA: Diagnosis not present

## 2022-11-08 MED ORDER — ONABOTULINUMTOXINA 100 UNITS IJ SOLR
50.0000 [IU] | Freq: Once | INTRAMUSCULAR | Status: AC
  Administered 2022-11-08: 50 [IU] via INTRAMUSCULAR

## 2022-11-08 NOTE — Progress Notes (Signed)
    PATIENT: Jaime Rodriguez DOB: [birth year]  Chief Complaint  Patient presents with   Procedure    Rm14, alone Pt is well and ready for botox     HISTORICAL   Jaime Rodriguez is a 72 y.o.  right-handed black female referred by Dr. Anne Hahn for EMG guided botulinumtoxin injection for left facial spasm   She had a history of left-sided hemifacial spasm that began approximately a year ago in 2013. The patient was seen by Dr. Vela Prose, and she underwent MRI evaluation of the brain with and without gadolinium that was unremarkable. The patient underwent Botox injections in 2013,   She did well with the Botox, and the benefit lasted almost one year. She did has mild left facial droop after the injection.   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    She denies any balance issues, speech problems or swallowing problems. The patient denies any change in vision. She denies a previous history of left Bell's palsy.   In recent 1 year, since 2014, She began to have urinary urgency, she never had children in the past she denies bilateral upper and lower extremity paresthesia    UPDATE March 7th 2016:   Last injection was in Feb 2015, very helpful, did very well, she noticed recurrence of her left facial twitching, almost constant, she hoped to continue EMG guided xeomin injection for her left facial spasm,    Update Aug 24, 2021: She has been receiving EMG guided botulism toxin injection every 3 months, responding well, mild asymmetry with repeat injection,  Update November 23, 2021: She responded well to previous injection  UPDATE Feb 15 2022: She did well with previous injection  UPDATE August 02 2022: She did well with previous injection  On exam: She has frequent left hemifacial muscle spasm, involving  left orbicularis oculi, orbicularis oris, mild left cheek puff weakness. She has mild  left eye closure weakness.,  Asymmetry, low-lying left nasolabial fold   Assessment/Plan:   Left hemifacial spasm, responded very well to previous EMG guided botulism toxin injection,    EMG guided Botox a injection  100 units, dissolvable to 2 cc of normal saline,   2.5 units at each injection sites  (2.5x 20 ) =50 units, discard 50 units   Levert Feinstein, M.D. Ph.D.    [facility] [address] [place], [place] [address] Phone: [phone] Fax:      [phone]

## 2022-11-08 NOTE — Progress Notes (Signed)
Botox- 50 units x 1 vials Lot: [id number] Expiration: 08.2026 NDC: 1478-2956-21  Bacteriostatic 0.9% Sodium Chloride- 2 mL  Lot: [id number] Expiration: 11.01.2025 NDC: 8469629528  Dx: G21.32. B/B Witnessed by April RN

## 2022-11-17 ENCOUNTER — Other Ambulatory Visit (HOSPITAL_COMMUNITY): Payer: Self-pay

## 2022-11-24 ENCOUNTER — Other Ambulatory Visit: Payer: Self-pay | Admitting: Nurse Practitioner

## 2022-11-24 ENCOUNTER — Other Ambulatory Visit (HOSPITAL_COMMUNITY): Payer: Self-pay

## 2022-11-24 MED ORDER — FREESTYLE LITE TEST VI STRP
ORAL_STRIP | 12 refills | Status: DC
  Filled 2022-11-24 (×3): qty 100, 50d supply, fill #0

## 2022-11-24 MED ORDER — FREESTYLE LANCETS MISC
12 refills | Status: DC
  Filled 2022-11-24 (×3): qty 100, 50d supply, fill #0

## 2022-11-27 ENCOUNTER — Other Ambulatory Visit (HOSPITAL_COMMUNITY): Payer: Self-pay

## 2022-11-30 ENCOUNTER — Other Ambulatory Visit: Payer: Self-pay | Admitting: Nurse Practitioner

## 2022-11-30 DIAGNOSIS — R7303 Prediabetes: Secondary | ICD-10-CM

## 2022-12-01 ENCOUNTER — Other Ambulatory Visit: Payer: Self-pay

## 2022-12-01 MED ORDER — METFORMIN HCL ER 750 MG PO TB24
750.0000 mg | ORAL_TABLET | Freq: Every day | ORAL | 1 refills | Status: DC
  Filled 2022-12-01: qty 90, 90d supply, fill #0

## 2022-12-17 ENCOUNTER — Encounter: Payer: Self-pay | Admitting: Nurse Practitioner

## 2022-12-19 ENCOUNTER — Encounter: Payer: Self-pay | Admitting: Nurse Practitioner

## 2022-12-20 ENCOUNTER — Other Ambulatory Visit: Payer: Self-pay | Admitting: Nurse Practitioner

## 2022-12-20 DIAGNOSIS — E782 Mixed hyperlipidemia: Secondary | ICD-10-CM

## 2022-12-20 DIAGNOSIS — Z0000 Encounter for general adult medical examination without abnormal findings: Secondary | ICD-10-CM

## 2022-12-20 DIAGNOSIS — R7303 Prediabetes: Secondary | ICD-10-CM

## 2022-12-25 DIAGNOSIS — E782 Mixed hyperlipidemia: Secondary | ICD-10-CM | POA: Diagnosis not present

## 2022-12-25 DIAGNOSIS — Z0000 Encounter for general adult medical examination without abnormal findings: Secondary | ICD-10-CM | POA: Diagnosis not present

## 2022-12-25 DIAGNOSIS — R7303 Prediabetes: Secondary | ICD-10-CM | POA: Diagnosis not present

## 2022-12-26 LAB — CMP14+EGFR
ALT: 13 IU/L (ref 0–32)
AST: 18 IU/L (ref 0–40)
Albumin: 4.4 g/dL (ref 3.8–4.8)
Alkaline Phosphatase: 82 IU/L (ref 44–121)
BUN/Creatinine Ratio: 16 (ref 12–28)
BUN: 15 mg/dL (ref 8–27)
Bilirubin Total: 0.2 mg/dL (ref 0.0–1.2)
CO2: 23 mmol/L (ref 20–29)
Calcium: 9.5 mg/dL (ref 8.7–10.3)
Chloride: 103 mmol/L (ref 96–106)
Creatinine, Ser: 0.95 mg/dL (ref 0.57–1.00)
Globulin, Total: 2.7 g/dL (ref 1.5–4.5)
Glucose: 87 mg/dL (ref 70–99)
Potassium: 4.7 mmol/L (ref 3.5–5.2)
Sodium: 141 mmol/L (ref 134–144)
Total Protein: 7.1 g/dL (ref 6.0–8.5)
eGFR: 64 mL/min/{1.73_m2} (ref >59)

## 2022-12-26 LAB — CBC WITH DIFFERENTIAL/PLATELET
Basophils Absolute: 0 10*3/uL (ref 0.0–0.2)
Basos: 1 %
EOS (ABSOLUTE): 0.1 10*3/uL (ref 0.0–0.4)
Eos: 2 %
Hematocrit: 36.4 % (ref 34.0–46.6)
Hemoglobin: 11.8 g/dL (ref 11.1–15.9)
Immature Grans (Abs): 0 10*3/uL (ref 0.0–0.1)
Immature Granulocytes: 0 %
Lymphocytes Absolute: 1.3 10*3/uL (ref 0.7–3.1)
Lymphs: 33 %
MCH: 31.2 pg (ref 26.6–33.0)
MCHC: 32.4 g/dL (ref 31.5–35.7)
MCV: 96 fL (ref 79–97)
Monocytes Absolute: 0.4 10*3/uL (ref 0.1–0.9)
Monocytes: 12 %
Neutrophils Absolute: 2 10*3/uL (ref 1.4–7.0)
Neutrophils: 52 %
Platelets: 271 10*3/uL (ref 150–450)
RBC: 3.78 x10E6/uL (ref 3.77–5.28)
RDW: 12.1 % (ref 11.7–15.4)
WBC: 3.8 10*3/uL (ref 3.4–10.8)

## 2022-12-26 LAB — LIPID PANEL
Chol/HDL Ratio: 2.9 ratio (ref 0.0–4.4)
Cholesterol, Total: 232 mg/dL — ABNORMAL HIGH (ref 100–199)
HDL: 79 mg/dL (ref >39)
LDL Chol Calc (NIH): 144 mg/dL — ABNORMAL HIGH (ref 0–99)
Triglycerides: 56 mg/dL (ref 0–149)
VLDL Cholesterol Cal: 9 mg/dL (ref 5–40)

## 2022-12-26 LAB — TSH+FREE T4
Free T4: 1.18 ng/dL (ref 0.82–1.77)
TSH: 2.51 u[IU]/mL (ref 0.450–4.500)

## 2022-12-26 LAB — HEMOGLOBIN A1C
Est. average glucose Bld gHb Est-mCnc: 131 mg/dL
Hgb A1c MFr Bld: 6.2 % — ABNORMAL HIGH (ref 4.8–5.6)

## 2022-12-26 LAB — VITAMIN D 25 HYDROXY (VIT D DEFICIENCY, FRACTURES): Vit D, 25-Hydroxy: 54.4 ng/mL (ref 30.0–100.0)

## 2022-12-28 ENCOUNTER — Other Ambulatory Visit (HOSPITAL_COMMUNITY): Payer: Self-pay

## 2022-12-28 ENCOUNTER — Ambulatory Visit (INDEPENDENT_AMBULATORY_CARE_PROVIDER_SITE_OTHER): Payer: Commercial Managed Care - PPO | Admitting: Nurse Practitioner

## 2022-12-28 ENCOUNTER — Encounter: Payer: Self-pay | Admitting: Nurse Practitioner

## 2022-12-28 VITALS — BP 110/68 | HR 95 | Temp 98.0°F | Ht 60.0 in | Wt 101.6 lb

## 2022-12-28 DIAGNOSIS — Z2821 Immunization not carried out because of patient refusal: Secondary | ICD-10-CM | POA: Diagnosis not present

## 2022-12-28 DIAGNOSIS — E782 Mixed hyperlipidemia: Secondary | ICD-10-CM

## 2022-12-28 DIAGNOSIS — R7303 Prediabetes: Secondary | ICD-10-CM

## 2022-12-28 DIAGNOSIS — Z0000 Encounter for general adult medical examination without abnormal findings: Secondary | ICD-10-CM | POA: Diagnosis not present

## 2022-12-28 DIAGNOSIS — R42 Dizziness and giddiness: Secondary | ICD-10-CM | POA: Diagnosis not present

## 2022-12-28 MED ORDER — MECLIZINE HCL 12.5 MG PO TABS
12.5000 mg | ORAL_TABLET | Freq: Three times a day (TID) | ORAL | 2 refills | Status: AC | PRN
  Filled 2022-12-28: qty 30, 10d supply, fill #0

## 2022-12-28 MED ORDER — METFORMIN HCL ER 750 MG PO TB24
750.0000 mg | ORAL_TABLET | Freq: Two times a day (BID) | ORAL | 1 refills | Status: DC
  Filled 2022-12-28 – 2023-02-27 (×2): qty 180, 90d supply, fill #0

## 2022-12-28 MED ORDER — VALACYCLOVIR HCL 500 MG PO TABS
500.0000 mg | ORAL_TABLET | ORAL | 1 refills | Status: AC
  Filled 2022-12-28: qty 90, 90d supply, fill #0

## 2022-12-29 ENCOUNTER — Other Ambulatory Visit (HOSPITAL_COMMUNITY): Payer: Self-pay

## 2022-12-29 ENCOUNTER — Other Ambulatory Visit: Payer: Self-pay

## 2023-01-01 ENCOUNTER — Encounter: Payer: Self-pay | Admitting: Nurse Practitioner

## 2023-01-01 DIAGNOSIS — Z1231 Encounter for screening mammogram for malignant neoplasm of breast: Secondary | ICD-10-CM | POA: Diagnosis not present

## 2023-01-01 DIAGNOSIS — Z1239 Encounter for other screening for malignant neoplasm of breast: Secondary | ICD-10-CM | POA: Diagnosis not present

## 2023-01-01 DIAGNOSIS — Z01419 Encounter for gynecological examination (general) (routine) without abnormal findings: Secondary | ICD-10-CM | POA: Diagnosis not present

## 2023-01-01 DIAGNOSIS — Z124 Encounter for screening for malignant neoplasm of cervix: Secondary | ICD-10-CM | POA: Diagnosis not present

## 2023-01-01 DIAGNOSIS — Z139 Encounter for screening, unspecified: Secondary | ICD-10-CM | POA: Diagnosis not present

## 2023-01-01 DIAGNOSIS — Z1331 Encounter for screening for depression: Secondary | ICD-10-CM | POA: Diagnosis not present

## 2023-01-01 DIAGNOSIS — Z1211 Encounter for screening for malignant neoplasm of colon: Secondary | ICD-10-CM | POA: Diagnosis not present

## 2023-01-01 DIAGNOSIS — M810 Age-related osteoporosis without current pathological fracture: Secondary | ICD-10-CM | POA: Diagnosis not present

## 2023-01-03 LAB — LIPOPROTEIN PHENOTYPING
Cholesterol, Total: 236 mg/dL — ABNORMAL HIGH (ref 100–199)
HDL: 77 mg/dL (ref >39)
LDL Chol Calc (NIH): 148 mg/dL — ABNORMAL HIGH (ref 0–99)
Triglycerides: 66 mg/dL (ref 0–149)
VLDL Cholesterol Cal: 11 mg/dL (ref 5–40)

## 2023-01-03 LAB — SPECIMEN STATUS REPORT

## 2023-01-04 ENCOUNTER — Other Ambulatory Visit (HOSPITAL_COMMUNITY): Payer: Self-pay

## 2023-01-04 ENCOUNTER — Other Ambulatory Visit: Payer: Self-pay | Admitting: Nurse Practitioner

## 2023-01-05 LAB — HM PAP SMEAR

## 2023-01-09 ENCOUNTER — Encounter: Payer: Self-pay | Admitting: Nurse Practitioner

## 2023-01-09 DIAGNOSIS — Z2821 Immunization not carried out because of patient refusal: Secondary | ICD-10-CM | POA: Insufficient documentation

## 2023-01-09 DIAGNOSIS — R42 Dizziness and giddiness: Secondary | ICD-10-CM | POA: Insufficient documentation

## 2023-01-09 DIAGNOSIS — Z0000 Encounter for general adult medical examination without abnormal findings: Secondary | ICD-10-CM | POA: Insufficient documentation

## 2023-01-09 NOTE — Assessment & Plan Note (Signed)
HgbA1c remains stable, will increase her metformin to two times a day

## 2023-01-09 NOTE — Assessment & Plan Note (Signed)
Cholesterol levels remain elevated, continue focus on low fat diet and taking statin.

## 2023-01-09 NOTE — Assessment & Plan Note (Signed)
Behavior modifications discussed and diet history reviewed.   Pt will continue to exercise regularly and modify diet with low GI, plant based foods and decrease intake of processed foods.  Recommend intake of daily multivitamin, Vitamin D, and calcium.  Recommend mammogram and colonoscopy for preventive screenings, as well as recommend immunizations that include influenza (plans to get at work), TDAP, and Shingles

## 2023-01-09 NOTE — Assessment & Plan Note (Addendum)
Minimal episodes, continue to stay well hydrated and taking medications as needed

## 2023-01-10 ENCOUNTER — Other Ambulatory Visit (HOSPITAL_COMMUNITY): Payer: Self-pay

## 2023-01-10 MED ORDER — LORATADINE 10 MG PO TABS
10.0000 mg | ORAL_TABLET | Freq: Every day | ORAL | 1 refills | Status: DC
  Filled 2023-01-10: qty 30, 30d supply, fill #0
  Filled 2023-02-09: qty 30, 30d supply, fill #1

## 2023-01-15 ENCOUNTER — Ambulatory Visit (HOSPITAL_COMMUNITY)
Admission: RE | Admit: 2023-01-15 | Discharge: 2023-01-15 | Disposition: A | Discharge status: Home / Self Care | Payer: Commercial Managed Care - PPO | Source: Ambulatory Visit | Attending: Nurse Practitioner | Admitting: Nurse Practitioner

## 2023-01-15 DIAGNOSIS — E782 Mixed hyperlipidemia: Secondary | ICD-10-CM | POA: Insufficient documentation

## 2023-01-22 ENCOUNTER — Encounter: Payer: Self-pay | Admitting: Nurse Practitioner

## 2023-01-25 ENCOUNTER — Other Ambulatory Visit: Payer: Self-pay | Admitting: Nurse Practitioner

## 2023-01-25 DIAGNOSIS — I700 Atherosclerosis of aorta: Secondary | ICD-10-CM

## 2023-01-25 DIAGNOSIS — E782 Mixed hyperlipidemia: Secondary | ICD-10-CM

## 2023-01-29 ENCOUNTER — Other Ambulatory Visit: Payer: Self-pay

## 2023-01-29 ENCOUNTER — Other Ambulatory Visit (HOSPITAL_COMMUNITY): Payer: Self-pay

## 2023-01-29 ENCOUNTER — Ambulatory Visit
Admission: EM | Admit: 2023-01-29 | Discharge: 2023-01-29 | Disposition: A | Discharge status: Home / Self Care | Payer: Commercial Managed Care - PPO | Attending: Nurse Practitioner | Admitting: Nurse Practitioner

## 2023-01-29 ENCOUNTER — Telehealth: Payer: Self-pay

## 2023-01-29 DIAGNOSIS — J069 Acute upper respiratory infection, unspecified: Secondary | ICD-10-CM | POA: Diagnosis not present

## 2023-01-29 DIAGNOSIS — Z1152 Encounter for screening for COVID-19: Secondary | ICD-10-CM | POA: Insufficient documentation

## 2023-01-29 MED ORDER — BENZONATATE 100 MG PO CAPS
100.0000 mg | ORAL_CAPSULE | Freq: Three times a day (TID) | ORAL | 0 refills | Status: DC | PRN
  Filled 2023-01-29: qty 21, 7d supply, fill #0

## 2023-01-29 MED ORDER — BENZONATATE 100 MG PO CAPS: 100.0000 mg | ORAL_CAPSULE | Freq: Three times a day (TID) | ORAL | 0 refills | Status: DC | PRN

## 2023-01-29 NOTE — Discharge Instructions (Addendum)
You have a viral upper respiratory infection.  Symptoms should improve over the next week to 10 days.  If you develop chest pain or shortness of breath, go to the emergency room.  We have tested you today for COVID-19.  You will see the results in Mychart and we will contact you with positive results.  Please stay home and isolate until you are fever free for 24 hours without fever reducing medication.    Some things that can make you feel better are: - Increased rest - Increasing fluid with water/sugar free electrolytes - Acetaminophen and ibuprofen as needed for fever/pain - Salt water gargling, chloraseptic spray and throat lozenges - OTC guaifenesin (Mucinex) 600 mg twice daily for congestion - Saline sinus flushes or a neti pot - Humidifying the air -Tessalon Perles every 8 hours as needed for dry cough

## 2023-01-29 NOTE — ED Triage Notes (Signed)
Pt c/o cough, nasal congestion started yesterdayfever last night ad this morning.

## 2023-01-29 NOTE — ED Provider Notes (Signed)
[facility]    CSN: [id number] Arrival date & time: 01/29/23  1332      History   Chief Complaint No chief complaint on file.   HPI Jaime Rodriguez is a 72 y.o. female.   Patient presents today with 3-day history of low-grade fevers, occasional congested cough with yellow mucus, dry cough, runny nose.  She denies shortness of breath or chest pain, chest tightness, stuffy nose, sore throat, ear pain, abdominal pain, nausea/vomiting, diarrhea.  No change in appetite or change in energy levels.  Has been taking ibuprofen and Robitussin for symptoms with improvement.  Reports at home COVID-19 test yesterday was negative.    Past Medical History:  Diagnosis Date   Allergy    Arthritis Unknown   Diabetes mellitus    Fibrocystic disease of breast    LEFT BREAST SURGERY   Hemifacial spasm 12/18/2012   Left lower face   Hyperlipidemia    Osteoporosis Unknown   Postmenopausal     Patient Active Problem List   Diagnosis Date Noted   Encounter for annual health examination 01/09/2023   Influenza vaccination declined 01/09/2023   Vertigo 01/09/2023   Impaired hearing 07/10/2022   Excessive cerumen in right ear canal 07/10/2022   Mixed hyperlipidemia 04/19/2018   Prediabetes 04/19/2018   Urinary urgency 06/06/2013   Hyperreflexia 06/06/2013   Facial nerve spasm 12/18/2012   Hemifacial spasm 12/18/2012   Menopausal symptoms 10/23/2011    Past Surgical History:  Procedure Laterality Date   BREAST LUMPECTOMY     Benign lesion    OB History     Gravida  0   Para  0   Term  0   Preterm  0   AB  0   Living  0      SAB  0   IAB  0   Ectopic  0   Multiple  0   Live Births  0            Home Medications    Prior to Admission medications   Medication Sig Start Date End Date Taking? Authorizing Provider  aspirin 81 MG tablet Take 81 mg by mouth daily.    [provider]  benzonatate (TESSALON) 100 MG capsule Take 1 capsule (100  mg total) by mouth 3 (three) times daily as needed for cough. Do not take with alcohol or while driving or operating heavy machinery.  May cause drowsiness. 01/29/23   Valentino Nose, NP  Blood Glucose Monitoring Suppl (FREESTYLE LITE) w/Device KIT Use 2 times a day to test blood sugar before breakfast and dinner 06/28/21   [name], FNP  botulinum toxin Type A (BOTOX) 100 units SOLR injection Inject 100 Units into the muscle every 3 (three) months.    [provider]  diclofenac sodium (VOLTAREN) 1 % GEL Apply 2 g topically 4 (four) times daily as needed. 04/19/18   [name], FNP  glucose blood (FREESTYLE LITE) test strip USE TWICE A DAY BEFORE BREAKFAST & DINNER AS DIRECTED 11/24/22   [name], FNP  Lancets (FREESTYLE) lancets USE TWICE A DAY BEFORE BREAKFAST & DINNER AS DIRECTED 11/24/22   [name], FNP  loratadine (CLARITIN) 10 MG tablet Take 1 tablet (10 mg total) by mouth daily. 01/10/23   [name], FNP  meclizine (ANTIVERT) 12.5 MG tablet Take 1 tablet (12.5 mg total) by mouth 3 (three) times daily as needed for dizziness. 12/28/22   [name], FNP  metFORMIN (GLUCOPHAGE-XR) 750  MG 24 hr tablet Take 1 tablet (750 mg total) by mouth 2 (two) times daily. 12/28/22   [name], FNP  Pitavastatin Calcium (LIVALO) 4 MG TABS Take 1 tablet (4 mg total) by mouth daily. 10/25/22   [name], FNP  SODIUM FLUORIDE, DENTAL RINSE, (PREVIDENT) 0.2 % SOLN USE AS AN ORAL RINSE, AS DIRECTED ON PACKAGE 02/17/22     valACYclovir (VALTREX) 500 MG tablet Take 1 tablet (500 mg total) by mouth as directed. 12/28/22   [name], FNP    Family History Family History  Problem Relation Age of Onset   Cancer Mother    Other Mother        sepsis   Stroke Father    Prostate cancer Brother    Cancer Maternal Grandmother    Asthma Sister    Hyperlipidemia Sister    Diabetes Sister    COPD Sister    Heart disease Sister    Hypertension Sister    Cancer Brother     Hypertension Sister    Anxiety disorder Other    Diabetes Other    Hyperlipidemia Other    Cancer Brother     Social History Social History   Tobacco Use   Smoking status: Never   Smokeless tobacco: Never  Vaping Use   Vaping status: Never Used  Substance Use Topics   Alcohol use: Never   Drug use: Never     Allergies   Penicillins   Review of Systems Review of Systems Per HPI  Physical Exam Triage Vital Signs ED Triage Vitals  Encounter Vitals Group     BP 01/29/23 1509 126/73     Systolic BP Percentile --      Diastolic BP Percentile --      Pulse Rate 01/29/23 1509 100     Resp 01/29/23 1509 18     Temp 01/29/23 1509 99 F (37.2 C)     Temp Source 01/29/23 1509 Oral     SpO2 01/29/23 1509 96 %     Weight --      Height --      Head Circumference --      Peak Flow --      Pain Score 01/29/23 1512 0     Pain Loc --      Pain Education --      Exclude from Growth Chart --    No data found.  Updated Vital Signs BP 126/73 (BP Location: Right Arm)   Pulse 100   Temp 99 F (37.2 C) (Oral)   Resp 18   SpO2 96%   Visual Acuity Right Eye Distance:   Left Eye Distance:   Bilateral Distance:    Right Eye Near:   Left Eye Near:    Bilateral Near:     Physical Exam Vitals and nursing note reviewed.  Constitutional:      General: She is not in acute distress.    Appearance: Normal appearance. She is not ill-appearing or toxic-appearing.  HENT:     Head: Normocephalic and atraumatic.     Right Ear: Tympanic membrane, ear canal and external ear normal.     Left Ear: Tympanic membrane, ear canal and external ear normal.     Nose: No congestion or rhinorrhea.     Mouth/Throat:     Mouth: Mucous membranes are moist.     Pharynx: Oropharynx is clear. No oropharyngeal exudate or posterior oropharyngeal erythema.  Eyes:     General: No scleral icterus.  Extraocular Movements: Extraocular movements intact.  Cardiovascular:     Rate and Rhythm:  Normal rate and regular rhythm.  Pulmonary:     Effort: Pulmonary effort is normal. No respiratory distress.     Breath sounds: Normal breath sounds. No wheezing, rhonchi or rales.  Musculoskeletal:     Cervical back: Normal range of motion and neck supple.  Lymphadenopathy:     Cervical: No cervical adenopathy.  Skin:    General: Skin is warm and dry.     Coloration: Skin is not jaundiced or pale.     Findings: No erythema or rash.  Neurological:     Mental Status: She is alert and oriented to person, place, and time.  Psychiatric:        Behavior: Behavior is cooperative.      UC Treatments / Results  Labs (all labs ordered are listed, but only abnormal results are displayed) Labs Reviewed  SARS CORONAVIRUS 2 (TAT 6-24 HRS)    EKG   Radiology No results found.  Procedures Procedures (including critical care time)  Medications Ordered in UC Medications - No data to display  Initial Impression / Assessment and Plan / UC Course  I have reviewed the triage vital signs and the nursing notes.  Pertinent labs & imaging results that were available during my care of the patient were reviewed by me and considered in my medical decision making (see chart for details).   Patient is well-appearing, normotensive, afebrile, not tachycardic, not tachypneic, oxygenating well on room air.    1. Viral URI with cough 2. Encounter for screening for COVID-19 Suspect viral etiology Vitals and exam are reassuring COVID-19 testing obtained Supportive care discussed with patient Start cough suppressant medication Strict ER and return precautions discussed Work excuse given-can return as long as fever free for 24 hours without fever reducing medication  The patient was given the opportunity to ask questions.  All questions answered to their satisfaction.  The patient is in agreement to this plan.   Final Clinical Impressions(s) / UC Diagnoses   Final diagnoses:  Viral URI with  cough  Encounter for screening for COVID-19     Discharge Instructions      You have a viral upper respiratory infection.  Symptoms should improve over the next week to 10 days.  If you develop chest pain or shortness of breath, go to the emergency room.  We have tested you today for COVID-19.  You will see the results in Mychart and we will contact you with positive results.  Please stay home and isolate until you are fever free for 24 hours without fever reducing medication.    Some things that can make you feel better are: - Increased rest - Increasing fluid with water/sugar free electrolytes - Acetaminophen and ibuprofen as needed for fever/pain - Salt water gargling, chloraseptic spray and throat lozenges - OTC guaifenesin (Mucinex) 600 mg twice daily for congestion - Saline sinus flushes or a neti pot - Humidifying the air -Tessalon Perles every 8 hours as needed for dry cough      ED Prescriptions     Medication Sig Dispense Auth. Provider   benzonatate (TESSALON) 100 MG capsule Take 1 capsule (100 mg total) by mouth 3 (three) times daily as needed for cough. Do not take with alcohol or while driving or operating heavy machinery.  May cause drowsiness. 21 capsule Valentino Nose, NP      PDMP not reviewed this encounter.   Cathlean Marseilles  A, NP 01/29/23 2694

## 2023-01-29 NOTE — Telephone Encounter (Signed)
Pt would like medication sent to [facility] on [address]

## 2023-01-30 ENCOUNTER — Encounter: Payer: Self-pay | Admitting: Pharmacist

## 2023-01-30 ENCOUNTER — Other Ambulatory Visit: Payer: Self-pay

## 2023-01-30 LAB — SARS CORONAVIRUS 2 (TAT 6-24 HRS): SARS Coronavirus 2: NEGATIVE

## 2023-02-07 ENCOUNTER — Encounter: Payer: Self-pay | Admitting: Neurology

## 2023-02-07 ENCOUNTER — Ambulatory Visit: Payer: Commercial Managed Care - PPO | Admitting: Neurology

## 2023-02-07 VITALS — BP 118/68 | Ht 61.0 in | Wt 104.0 lb

## 2023-02-07 DIAGNOSIS — G5132 Clonic hemifacial spasm, left: Secondary | ICD-10-CM | POA: Diagnosis not present

## 2023-02-07 MED ORDER — ONABOTULINUMTOXINA 100 UNITS IJ SOLR
100.0000 [IU] | Freq: Once | INTRAMUSCULAR | Status: AC
  Administered 2023-02-07: 100 [IU] via INTRAMUSCULAR

## 2023-02-07 NOTE — Progress Notes (Signed)
Botox-100 units x 1 vial Lot: [id number] Expiration: 03.2027 NDC: 0023-3921-02 BB Bacteriostatic 0.9% Sodium Chloride- 2 mL  Lot: [id number] Expiration: 04.01.2025 NDC: 1914782956  Dx: G51.32. B/B Witnessed by Cinda Quest, CMA

## 2023-02-07 NOTE — Progress Notes (Signed)
    PATIENT: Jaime Rodriguez DOB: Oct 26, 1950  Chief Complaint  Patient presents with   Botulinum Toxin Injection    Rm 14, botox consigned     HISTORICAL   [name] is a 72 y.o.  right-handed black female referred by Dr. Anne Hahn for EMG guided botulinumtoxin injection for left facial spasm   She had a history of left-sided hemifacial spasm that began approximately a year ago in 2013. The patient was seen by Dr. Vela Prose, and she underwent MRI evaluation of the brain with and without gadolinium that was unremarkable. The patient underwent Botox injections in 2013,   She did well with the Botox, and the benefit lasted almost one year. She did has mild left facial droop after the injection.   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    She denies any balance issues, speech problems or swallowing problems. The patient denies any change in vision. She denies a previous history of left Bell's palsy.   In recent 1 year, since 2014, She began to have urinary urgency, she never had children in the past she denies bilateral upper and lower extremity paresthesia    UPDATE March 7th 2016:   Last injection was in Feb 2015, very helpful, did very well, she noticed recurrence of her left facial twitching, almost constant, she hoped to continue EMG guided xeomin injection for her left facial spasm,    Update Aug 24, 2021: She has been receiving EMG guided botulism toxin injection every 3 months, responding well, mild asymmetry with repeat injection,  Update November 23, 2021: She responded well to previous injection  UPDATE Feb 15 2022: She did well with previous injection  UPDATE August 02 2022: She did well with previous injection  On exam: She has frequent left hemifacial muscle spasm, involving left  orbicularis oculi, orbicularis oris, mild left cheek puff weakness. She has mild  left eye closure weakness.,  Asymmetry, low-lying left nasolabial fold   Assessment/Plan:   Left hemifacial spasm, responded very well to previous EMG guided botulism toxin injection,    EMG guided Botox a injection  100 units, dissolvable to 2 cc of normal saline,   2.5 units at each injection sites  (2.5x 16 ) =40 units, discard 60 units   Levert Feinstein, M.D. Ph.D.    [facility] [address] [place], [place] [address] Phone: [phone] Fax:      [phone]

## 2023-02-09 ENCOUNTER — Other Ambulatory Visit (HOSPITAL_COMMUNITY): Payer: Self-pay

## 2023-02-27 ENCOUNTER — Other Ambulatory Visit (HOSPITAL_COMMUNITY): Payer: Self-pay

## 2023-03-16 ENCOUNTER — Other Ambulatory Visit: Payer: Self-pay | Admitting: Nurse Practitioner

## 2023-03-16 ENCOUNTER — Other Ambulatory Visit (HOSPITAL_COMMUNITY): Payer: Self-pay

## 2023-03-16 MED ORDER — LORATADINE 10 MG PO TABS
10.0000 mg | ORAL_TABLET | Freq: Every day | ORAL | 1 refills | Status: DC
  Filled 2023-03-16 – 2023-03-20 (×3): qty 30, 30d supply, fill #0
  Filled 2023-06-26: qty 30, 30d supply, fill #1

## 2023-03-19 ENCOUNTER — Encounter (HOSPITAL_COMMUNITY): Payer: Self-pay

## 2023-03-20 ENCOUNTER — Other Ambulatory Visit (HOSPITAL_COMMUNITY): Payer: Self-pay

## 2023-03-20 ENCOUNTER — Other Ambulatory Visit: Payer: Self-pay

## 2023-03-26 ENCOUNTER — Other Ambulatory Visit (HOSPITAL_COMMUNITY): Payer: Self-pay

## 2023-03-26 ENCOUNTER — Ambulatory Visit: Payer: Commercial Managed Care - PPO | Admitting: Internal Medicine

## 2023-03-26 ENCOUNTER — Other Ambulatory Visit: Payer: Self-pay

## 2023-03-26 ENCOUNTER — Ambulatory Visit (INDEPENDENT_AMBULATORY_CARE_PROVIDER_SITE_OTHER): Payer: Self-pay | Admitting: Audiology

## 2023-03-26 DIAGNOSIS — H903 Sensorineural hearing loss, bilateral: Secondary | ICD-10-CM

## 2023-03-26 MED ORDER — SODIUM FLUORIDE 0.2 % MT SOLN
OROMUCOSAL | 5 refills | Status: AC
  Filled 2023-03-26 (×2): qty 473, 30d supply, fill #0

## 2023-03-26 NOTE — Progress Notes (Signed)
  [address], [address] [place], [place] [address] [phone]  Hearing Aid Check     CAMERON FREDRICK comes for a scheduled appointment for a hearing aid check. Accompanied [id number]   Chief complaint: Patient reports that the left aid works its way out from her ear. The right side may do it sometimes.  Actions taken: Both devices were cleaned. Added a Medium (MM) retention lock to both hearing aids. The patient reported it seemed to fit better. Inspection of the device and listening check showed that they were working well.  Services fee: $0 was paid at checkout. Spent 10 minutes with the patient.  Patient was oriented about returning to clinic if this does not solve the issue.  Recommend: Return for a hearing aid check, as needed. Return for a hearing evaluation and to see an ENT, if concerns with hearing changes arise.    Mikka Kissner MARIE LEROUX-MARTINEZ, AUD

## 2023-03-27 ENCOUNTER — Other Ambulatory Visit: Payer: Self-pay

## 2023-03-27 ENCOUNTER — Ambulatory Visit: Payer: Commercial Managed Care - PPO | Attending: Internal Medicine | Admitting: Internal Medicine

## 2023-03-27 VITALS — BP 120/70 | HR 73 | Ht 60.0 in | Wt 102.8 lb

## 2023-03-27 DIAGNOSIS — H6121 Impacted cerumen, right ear: Secondary | ICD-10-CM | POA: Diagnosis not present

## 2023-03-27 DIAGNOSIS — E782 Mixed hyperlipidemia: Secondary | ICD-10-CM | POA: Diagnosis not present

## 2023-03-27 DIAGNOSIS — I700 Atherosclerosis of aorta: Secondary | ICD-10-CM | POA: Diagnosis not present

## 2023-03-27 DIAGNOSIS — R292 Abnormal reflex: Secondary | ICD-10-CM | POA: Diagnosis not present

## 2023-03-27 MED ORDER — ROSUVASTATIN CALCIUM 10 MG PO TABS
10.0000 mg | ORAL_TABLET | Freq: Every day | ORAL | 3 refills | Status: DC
  Filled 2023-03-27: qty 90, 90d supply, fill #0
  Filled 2023-06-26: qty 90, 90d supply, fill #1

## 2023-03-27 MED ORDER — COVID-19 MRNA VAC-TRIS(PFIZER) 30 MCG/0.3ML IM SUSY
0.5000 mL | PREFILLED_SYRINGE | Freq: Once | INTRAMUSCULAR | 0 refills | Status: AC
  Filled 2023-03-27: qty 0.3, 1d supply, fill #0

## 2023-03-27 NOTE — Patient Instructions (Signed)
Medication Instructions:  Stop pitavastatin  Start Crestor 10 mg daily *If you need a refill on your cardiac medications before your next appointment, please call your pharmacy*   Lab Work: Fasting lipids in 3 months  If you have labs (blood work) drawn today and your tests are completely normal, you will receive your results only by: MyChart Message (if you have MyChart) OR A paper copy in the mail If you have any lab test that is abnormal or we need to change your treatment, we will call you to review the results.   Testing/Procedures:  None needed   Follow-Up: At [facility], you and your health needs are our priority.  As part of our continuing mission to provide you with exceptional heart care, we have created designated Provider Care Teams.  These Care Teams include your primary Cardiologist (physician) and Advanced Practice Providers (APPs -  Physician Assistants and Nurse Practitioners) who all work together to provide you with the care you need, when you need it.    Your next appointment:   1 year(s)  Provider:   Carolan Clines MD

## 2023-03-27 NOTE — Progress Notes (Signed)
  Cardiology Office Note:  .   Date:  03/27/2023  ID:  Jaime Rodriguez, DOB 29-Mar-1951, MRN [id number] PCP: [name], FNP  [facility] Providers Cardiologist:  None    History of Present Illness: .   Jaime Rodriguez is a 72 y.o. female with hx of DM2, who is being referred to cardiology for aortic atherosclerosis/ HLD.  She has no cardiovascular disease history.  She had a coronary artery calcium score with a score of 31.5 which is 37 percentile for her age.  She is concerned that her total cholesterol is above 200.  She does eat foods with cholesterol and she is working on cutting back.  Otherwise she is asymptomatic  TC 236 HDL 77 TG 66 LDL 148  A1c 6.2  ROS:  per HPI otherwise negative   Studies Reviewed: Marland Kitchen        EKG Interpretation Date/Time:  Tuesday March 27 2023 15:33:03 EST Ventricular Rate:  73 PR Interval:  156 QRS Duration:  66 QT Interval:  376 QTC Calculation: 414 R Axis:   68  Text Interpretation: Normal sinus rhythm Normal ECG When compared with ECG of 02-Feb-2022 17:45, No significant change was found Confirmed by Carolan Clines ([id number]) on 03/27/2023 3:37:15 PM  Risk Assessment/Calculations:        Physical Exam:   VS:   Vitals:   03/27/23 1526  BP: 120/70  Pulse: 73  SpO2: 94%    Wt Readings from Last 3 Encounters:  02/07/23 104 lb (47.2 kg)  12/28/22 101 lb 9.6 oz (46.1 kg)  07/10/22 101 lb (45.8 kg)    GEN: Well nourished, well developed in no acute distress NECK: No JVD; No carotid bruits CARDIAC: RRR, no murmurs, rubs, gallops RESPIRATORY:  Clear to auscultation without rales, wheezing or rhonchi  ABDOMEN: Soft, non-tender, non-distended EXTREMITIES:  No edema; No deformity   ASSESSMENT AND PLAN: .   Aortic atherosclerosis CAC Here CAC score is low.  Her aortic atherosclerosis is mild.  She is on pitavastatin.  She is concerned because her total cholesterol has not been under 200.  Otherwise, she has no history of cardiovascular  disease.  Therefore, can consider a statin medication with history of diabetes but we will change it to crestor 10 mg daily and get a follow-up fasting lipid profile in 3 months.  Further considering her CAC score was low there is no strong indication for aspirin, we discussed she can stop if she prefers.  MVP: asymptomatic for many years. Diagnosed in 1978.  If she develops symptoms can get an echocardiogram   DM2: A1c goal <7%; she is at goal       Dispo: Follow up in one year  Signed, Blakelyn Dinges, Alben Spittle, MD

## 2023-03-29 ENCOUNTER — Ambulatory Visit: Payer: Commercial Managed Care - PPO | Admitting: Cardiology

## 2023-04-17 ENCOUNTER — Encounter: Payer: Self-pay | Admitting: Podiatry

## 2023-04-17 ENCOUNTER — Ambulatory Visit: Payer: Commercial Managed Care - PPO | Admitting: Podiatry

## 2023-04-17 DIAGNOSIS — M2012 Hallux valgus (acquired), left foot: Secondary | ICD-10-CM | POA: Diagnosis not present

## 2023-04-17 DIAGNOSIS — M2142 Flat foot [pes planus] (acquired), left foot: Secondary | ICD-10-CM

## 2023-04-17 DIAGNOSIS — M2141 Flat foot [pes planus] (acquired), right foot: Secondary | ICD-10-CM | POA: Diagnosis not present

## 2023-04-17 DIAGNOSIS — L84 Corns and callosities: Secondary | ICD-10-CM | POA: Insufficient documentation

## 2023-04-17 DIAGNOSIS — M2011 Hallux valgus (acquired), right foot: Secondary | ICD-10-CM

## 2023-04-17 DIAGNOSIS — E119 Type 2 diabetes mellitus without complications: Secondary | ICD-10-CM | POA: Insufficient documentation

## 2023-04-17 DIAGNOSIS — E1122 Type 2 diabetes mellitus with diabetic chronic kidney disease: Secondary | ICD-10-CM | POA: Insufficient documentation

## 2023-04-17 NOTE — Progress Notes (Signed)
 DIABETIC FOOT EXAM  Subjective: Makhya A Laramee presents today for diabetic foot exam.  Chief Complaint  Patient presents with   Diabetes    Patient last saw her PCP 3 months ago , patient A1c is 6.3    Patient confirms h/o diabetes.  Patient denies any h/o foot wounds.  She is here to establish care and has concern for painful plantar callus of left foot. She has seen community podiatrist for the past few years and received routine foot care.  Georgina Speaks, FNP is patient's PCP. LOV 12/28/2022.  Past Medical History:  Diagnosis Date   Allergy    Arthritis Unknown   Diabetes mellitus    Fibrocystic disease of breast    LEFT BREAST SURGERY   Hemifacial spasm 12/18/2012   Left lower face   Hyperlipidemia    Osteoporosis Unknown   Postmenopausal    Patient Active Problem List   Diagnosis Date Noted   Type 2 diabetes mellitus (HCC) 04/17/2023   Callus of foot 04/17/2023   Encounter for annual health examination 01/09/2023   Influenza vaccination declined 01/09/2023   Vertigo 01/09/2023   Impaired hearing 07/10/2022   Excessive cerumen in right ear canal 07/10/2022   Mixed hyperlipidemia 04/19/2018   Prediabetes 04/19/2018   Urinary urgency 06/06/2013   Hyperreflexia 06/06/2013   Facial nerve spasm 12/18/2012   Hemifacial spasm 12/18/2012   Menopausal symptoms 10/23/2011   Past Surgical History:  Procedure Laterality Date   BREAST LUMPECTOMY     Benign lesion   Current Outpatient Medications on File Prior to Visit  Medication Sig Dispense Refill   benzonatate  (TESSALON ) 100 MG capsule Take 1 capsule (100 mg total) by mouth 3 (three) times daily as needed for cough. Do not take with alcohol or while driving or operating heavy machinery.  May cause drowsiness. 21 capsule 0   Blood Glucose Monitoring Suppl (FREESTYLE LITE) w/Device KIT Use 2 times a day to test blood sugar before breakfast and dinner 1 kit 1   botulinum toxin Type A  (BOTOX ) 100 units SOLR injection  Inject 100 Units into the muscle every 3 (three) months.     diclofenac  sodium (VOLTAREN ) 1 % GEL Apply 2 g topically 4 (four) times daily as needed. 1 Tube 3   glucose blood (FREESTYLE LITE) test strip USE TWICE A DAY BEFORE BREAKFAST & DINNER AS DIRECTED 100 strip 12   Lancets (FREESTYLE) lancets USE TWICE A DAY BEFORE BREAKFAST & DINNER AS DIRECTED 100 each 12   loratadine  (CLARITIN ) 10 MG tablet Take 1 tablet (10 mg total) by mouth daily. 30 tablet 1   meclizine  (ANTIVERT ) 12.5 MG tablet Take 1 tablet (12.5 mg total) by mouth 3 (three) times daily as needed for dizziness. 30 tablet 2   metFORMIN  (GLUCOPHAGE -XR) 750 MG 24 hr tablet Take 1 tablet (750 mg total) by mouth 2 (two) times daily. 180 tablet 1   rosuvastatin  (CRESTOR ) 10 MG tablet Take 1 tablet (10 mg total) by mouth daily. 90 tablet 3   SODIUM FLUORIDE , DENTAL RINSE, (PREVIDENT ) 0.2 % SOLN USE AS AN ORAL RINSE, AS DIRECTED ON PACKAGE 473 mL 5   SODIUM FLUORIDE , DENTAL RINSE, (PREVIDENT ) 0.2 % SOLN USE AS AN ORAL RINSE, AS DIRECTED ON PACKAGE 473 mL 5   valACYclovir  (VALTREX ) 500 MG tablet Take 1 tablet (500 mg total) by mouth as directed. 90 tablet 1   aspirin 81 MG tablet Take 81 mg by mouth daily.     No current facility-administered medications on  file prior to visit.    Allergies  Allergen Reactions   Penicillins    Social History   Occupational History   Not on file  Tobacco Use   Smoking status: Never   Smokeless tobacco: Never  Vaping Use   Vaping status: Never Used  Substance and Sexual Activity   Alcohol use: Never   Drug use: Never   Sexual activity: Never    Birth control/protection: None   Family History  Problem Relation Age of Onset   Cancer Mother    Other Mother        sepsis   Stroke Father    Prostate cancer Brother    Cancer Maternal Grandmother    Asthma Sister    Hyperlipidemia Sister    Diabetes Sister    COPD Sister    Heart disease Sister    Hypertension Sister    Cancer Brother     Hypertension Sister    Anxiety disorder Other    Diabetes Other    Hyperlipidemia Other    Cancer Brother    Immunization History  Administered Date(s) Administered   Influenza-Unspecified 12/17/2017, 12/10/2018, 01/15/2020, 12/30/2020   Moderna Covid-19 Vaccine Bivalent Booster 31yrs & up 02/17/2021   Moderna Sars-Covid-2 Vaccination 05/05/2019, 06/03/2019, 02/19/2020, 08/05/2020   PNEUMOCOCCAL CONJUGATE-20 08/11/2021   Pfizer(Comirnaty )Fall Seasonal Vaccine 12 years and older 02/15/2022, 03/27/2023   Pneumococcal Polysaccharide-23 12/08/2019   Tdap 05/14/2015   Zoster Recombinant(Shingrix) 11/16/2017, 01/18/2018   Zoster, Live 10/08/2017     Review of Systems: Negative except as noted in the HPI.   Objective: There were no vitals filed for this visit.  Keashia A Thwaites is a pleasant 73 y.o. female in NAD. AAO X 3.  Title   Diabetic Foot Exam - detailed Date & Time: 04/17/2023  4:00 PM Diabetic Foot exam was performed with the following findings: Yes  Visual Foot Exam completed.: Yes  Is there a history of foot ulcer?: No Is there a foot ulcer now?: No Is there swelling?:  (Comment: Trace edema b/l ankles) Is there elevated skin temperature?: No Is there abnormal foot shape?: No Is there a claw toe deformity?: No Are the toenails long?: No Are the toenails thick?: No Are the toenails ingrown?: No Is the skin thin, fragile, shiny and hairless?: No Normal Range of Motion?: Yes Is there foot or ankle muscle weakness?: No Do you have pain in calf while walking?: No Are the shoes appropriate in style and fit?: No (Comment: Needs new shoegear) Can the patient see the bottom of their feet?: Yes Pulse Foot Exam completed.: Yes   Right Posterior Tibialis: Present Left posterior Tibialis: Present   Right Dorsalis Pedis: Present Left Dorsalis Pedis: Present     Sensory Foot Exam Completed.: Yes Semmes-Weinstein Monofilament Test + means has sensation and - means no  sensation  R Foot Test Control: Pos L Foot Test Control: Pos   R Site 1-Great Toe: Pos L Site 1-Great Toe: Pos   R Site 4: Pos L Site 4: Pos   R site 5: Pos L Site 5: Pos  R Site 6: Pos L Site 6: Pos     Image components are not supported.   Image components are not supported. Image components are not supported.  Tuning [place] Right vibratory: present Left vibratory: present  Comments Pedal skin is warm and supple b/l LE. No open wounds b/l LE. No interdigital macerations noted b/l LE. Toenails 1-5 b/l well maintained with adequate length. No erythema, no  edema, no drainage, no fluctuance. Porokeratotic lesion(s) submet head 5 left foot. No erythema, no edema, no drainage, no fluctuance.  Muscle strength 5/5 to all lower extremity muscle groups bilaterally. HAV with bunion deformity noted b/l LE. Pes planus deformity noted bilateral LE.       Lab Results  Component Value Date   HGBA1C 6.2 (H) 12/25/2022    ADA Risk Categorization: Low Risk :  Patient has all of the following: Intact protective sensation No prior foot ulcer  No severe deformity Pedal pulses present  Assessment: 1. Callus of foot   2. Hallux valgus, acquired, bilateral   3. Pes planus of both feet   4. Type 2 diabetes mellitus without complication, without long-term current use of insulin (HCC)   5. Encounter for diabetic foot exam [facility])     Plan: -Patient was evaluated today. All questions/concerns addressed on today's visit. -Placed offloaded padding to left insole. Recommended new sneakers: Hokas or New Balance sneakers series 800 or higher. -Diabetic foot examination performed today. -Continue foot and shoe inspections daily. Monitor blood glucose per PCP/Endocrinologist's recommendations. -Patient to continue soft, supportive shoe gear daily. -Porokeratotic lesion(s) submet head 5 left foot pared and enucleated with sterile currette without incident. Total number of lesions debrided=1. -Patient/POA  to call should there be question/concern in the interim. Return in about 3 months (around 07/16/2023).  Delon LITTIE Merlin, DPM      [place] LOCATION: [address], [place] [address]                   Office [phone]   [facility] LOCATION: [address] [place], [place] [address] Office [phone]

## 2023-04-19 ENCOUNTER — Other Ambulatory Visit (HOSPITAL_COMMUNITY): Payer: Self-pay

## 2023-04-26 ENCOUNTER — Encounter: Payer: Self-pay | Admitting: Podiatry

## 2023-05-02 ENCOUNTER — Encounter: Payer: Self-pay | Admitting: Neurology

## 2023-05-02 ENCOUNTER — Ambulatory Visit: Payer: Commercial Managed Care - PPO | Admitting: Neurology

## 2023-05-02 DIAGNOSIS — G5132 Clonic hemifacial spasm, left: Secondary | ICD-10-CM

## 2023-05-02 MED ORDER — ONABOTULINUMTOXINA 100 UNITS IJ SOLR
100.0000 [IU] | Freq: Once | INTRAMUSCULAR | Status: AC
  Administered 2023-05-02: 100 [IU] via INTRAMUSCULAR

## 2023-05-02 NOTE — Progress Notes (Signed)
Botox- 100 units x 1vials Lot: [id number] Expiration: 07/2025 NDC: 3086-5784-69  Bacteriostatic 0.9% Sodium Chloride- 2 mL  Lot: [id number] Expiration: 02/09/2024 NDC: 4132-4401-02  Dx: V25.36 B/B Witnessed by April Jones RN

## 2023-05-02 NOTE — Progress Notes (Signed)
    PATIENT: Jaime Rodriguez DOB: [birth year]  Chief Complaint  Patient presents with   Room 14    Pt is here Alone.      HISTORICAL   Jaime Rodriguez is a 73 y.o.  right-handed black female referred by Dr. Anne Hahn for EMG guided botulinumtoxin injection for left facial spasm   She had a history of left-sided hemifacial spasm that began approximately a year ago in 2013. The patient was seen by Dr. Vela Prose, and she underwent MRI evaluation of the brain with and without gadolinium that was unremarkable. The patient underwent Botox injections in 2013,   She did well with the Botox, and the benefit lasted almost one year. She did has mild left facial droop after the injection.   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    She denies any balance issues, speech problems or swallowing problems. The patient denies any change in vision. She denies a previous history of left Bell's palsy.   In recent 1 year, since 2014, She began to have urinary urgency, she never had children in the past she denies bilateral upper and lower extremity paresthesia    She is receiving every 3 months EMG guided Botox injection for left hemifacial spasm  On exam: She has frequent left hemifacial muscle spasm, involving left orbicularis oculi, orbicularis oris, mild left cheek puff weakness. She has mild  left eye closure weakness.,  Asymmetry, low-lying left nasolabial fold   Assessment/Plan:   Left hemifacial spasm,      EMG guided Botox a injection  100 units, dissolved into 2 cc of normal saline,   2.5 units at each injection sites  (2.5x 15) =37.5 units, discard 62.5 units   Levert Feinstein, M.D. Ph.D.    [facility] [address] [place], [place] [address] Phone: [phone] Fax:      [phone]

## 2023-06-24 ENCOUNTER — Encounter: Payer: Self-pay | Admitting: Nurse Practitioner

## 2023-06-25 ENCOUNTER — Other Ambulatory Visit: Payer: Self-pay | Admitting: Nurse Practitioner

## 2023-06-25 DIAGNOSIS — R7303 Prediabetes: Secondary | ICD-10-CM

## 2023-06-25 DIAGNOSIS — E782 Mixed hyperlipidemia: Secondary | ICD-10-CM

## 2023-06-25 NOTE — Telephone Encounter (Signed)
 Lipid panel, BMP, A1c and TSH with Free  t4, she needs to be fasting as well.

## 2023-06-26 ENCOUNTER — Telehealth: Payer: Self-pay | Admitting: Neurology

## 2023-06-26 ENCOUNTER — Other Ambulatory Visit (HOSPITAL_COMMUNITY): Payer: Self-pay

## 2023-06-26 NOTE — Telephone Encounter (Signed)
 Received fax of approval from [place], pt will continue to be buy/bill.  Auth#: [id number] (06/26/23-06/24/24)

## 2023-06-26 NOTE — Telephone Encounter (Signed)
 Faxed completed PA form and notes to [facility] @ [phone].

## 2023-06-27 ENCOUNTER — Ambulatory Visit: Payer: Commercial Managed Care - PPO | Admitting: Nurse Practitioner

## 2023-07-02 DIAGNOSIS — R7303 Prediabetes: Secondary | ICD-10-CM | POA: Diagnosis not present

## 2023-07-02 DIAGNOSIS — E782 Mixed hyperlipidemia: Secondary | ICD-10-CM | POA: Diagnosis not present

## 2023-07-03 LAB — LIPID PANEL
Chol/HDL Ratio: 2.6 ratio (ref 0.0–4.4)
Cholesterol, Total: 205 mg/dL — ABNORMAL HIGH (ref 100–199)
HDL: 78 mg/dL (ref >39)
LDL Chol Calc (NIH): 117 mg/dL — ABNORMAL HIGH (ref 0–99)
Triglycerides: 57 mg/dL (ref 0–149)
VLDL Cholesterol Cal: 10 mg/dL (ref 5–40)

## 2023-07-03 LAB — BMP8+EGFR
BUN/Creatinine Ratio: 14 (ref 12–28)
BUN: 13 mg/dL (ref 8–27)
CO2: 24 mmol/L (ref 20–29)
Calcium: 9.5 mg/dL (ref 8.7–10.3)
Chloride: 102 mmol/L (ref 96–106)
Creatinine, Ser: 0.93 mg/dL (ref 0.57–1.00)
Glucose: 87 mg/dL (ref 70–99)
Potassium: 4.6 mmol/L (ref 3.5–5.2)
Sodium: 141 mmol/L (ref 134–144)
eGFR: 65 mL/min/{1.73_m2} (ref >59)

## 2023-07-03 LAB — TSH+FREE T4
Free T4: 1.14 ng/dL (ref 0.82–1.77)
TSH: 3.63 u[IU]/mL (ref 0.450–4.500)

## 2023-07-03 LAB — HEMOGLOBIN A1C
Est. average glucose Bld gHb Est-mCnc: 143 mg/dL
Hgb A1c MFr Bld: 6.6 % — ABNORMAL HIGH (ref 4.8–5.6)

## 2023-07-05 ENCOUNTER — Ambulatory Visit: Admitting: Family Medicine

## 2023-07-05 ENCOUNTER — Other Ambulatory Visit (HOSPITAL_COMMUNITY): Payer: Self-pay

## 2023-07-05 ENCOUNTER — Encounter: Payer: Self-pay | Admitting: Family Medicine

## 2023-07-05 ENCOUNTER — Other Ambulatory Visit: Payer: Self-pay

## 2023-07-05 VITALS — BP 140/82 | HR 85 | Temp 98.8°F | Ht 60.0 in | Wt 105.0 lb

## 2023-07-05 DIAGNOSIS — E1165 Type 2 diabetes mellitus with hyperglycemia: Secondary | ICD-10-CM | POA: Diagnosis not present

## 2023-07-05 DIAGNOSIS — I700 Atherosclerosis of aorta: Secondary | ICD-10-CM

## 2023-07-05 DIAGNOSIS — E782 Mixed hyperlipidemia: Secondary | ICD-10-CM

## 2023-07-05 DIAGNOSIS — E119 Type 2 diabetes mellitus without complications: Secondary | ICD-10-CM | POA: Diagnosis not present

## 2023-07-05 MED ORDER — METFORMIN HCL 500 MG PO TABS
750.0000 mg | ORAL_TABLET | Freq: Two times a day (BID) | ORAL | 1 refills | Status: AC
  Filled 2023-07-05: qty 270, 90d supply, fill #0
  Filled 2023-08-28 – 2023-10-31 (×2): qty 270, 90d supply, fill #1

## 2023-07-05 NOTE — Progress Notes (Signed)
 I,Jameka J Llittleton, CMA,acting as a Neurosurgeon for [employer], NP.,have documented all relevant documentation on the behalf of Ellender Hose, NP,as directed by  Ellender Hose, NP while in the presence of Ellender Hose, NP.  Subjective:  Patient ID: Jaime Rodriguez , female    DOB: 1951-01-01 , 73 y.o.   MRN: [id number]  Chief Complaint  Patient presents with   Hyperlipidemia    HPI  Patient is a 73 year old female who presents today for a hypercholesterolemia and prediabetes evaluation. Her last A1c was 6.6. Patient is on metformin 750 mg twice daily and Crestor 10 mg  every day. She reports that she has not been able to take metformin 750 mg XL twice daily because it is the extended  form. Will change medication today to regular metformin.She has no questions at this time.     Past Medical History:  Diagnosis Date   Allergy    Arthritis Unknown   Diabetes mellitus    Fibrocystic disease of breast    LEFT BREAST SURGERY   Hemifacial spasm 12/18/2012   Left lower face   Hyperlipidemia    Osteoporosis Unknown   Postmenopausal      Family History  Problem Relation Age of Onset   Cancer Mother    Other Mother        sepsis   Stroke Father    Prostate cancer Brother    Cancer Maternal Grandmother    Asthma Sister    Hyperlipidemia Sister    Diabetes Sister    COPD Sister    Heart disease Sister    Hypertension Sister    Cancer Brother    Hypertension Sister    Anxiety disorder Other    Diabetes Other    Hyperlipidemia Other    Cancer Brother      Current Outpatient Medications:    Blood Glucose Monitoring Suppl (FREESTYLE LITE) w/Device KIT, Use 2 times a day to test blood sugar before breakfast and dinner, Disp: 1 kit, Rfl: 1   botulinum toxin Type A (BOTOX) 100 units SOLR injection, Inject 100 Units into the muscle every 3 (three) months., Disp: , Rfl:    diclofenac sodium (VOLTAREN) 1 % GEL, Apply 2 g topically 4 (four) times daily as needed., Disp: 1 Tube, Rfl: 3    glucose blood (FREESTYLE LITE) test strip, USE TWICE A DAY BEFORE BREAKFAST & DINNER AS DIRECTED, Disp: 100 strip, Rfl: 12   Lancets (FREESTYLE) lancets, USE TWICE A DAY BEFORE BREAKFAST & DINNER AS DIRECTED, Disp: 100 each, Rfl: 12   loratadine (CLARITIN) 10 MG tablet, Take 1 tablet (10 mg total) by mouth daily., Disp: 30 tablet, Rfl: 1   meclizine (ANTIVERT) 12.5 MG tablet, Take 1 tablet (12.5 mg total) by mouth 3 (three) times daily as needed for dizziness., Disp: 30 tablet, Rfl: 2   metFORMIN (GLUCOPHAGE) 500 MG tablet, Take 1.5 tablets (750 mg total) by mouth 2 (two) times daily with a meal., Disp: 270 tablet, Rfl: 1   rosuvastatin (CRESTOR) 10 MG tablet, Take 1 tablet (10 mg total) by mouth daily., Disp: 90 tablet, Rfl: 3   SODIUM FLUORIDE, DENTAL RINSE, (PREVIDENT) 0.2 % SOLN, USE AS AN ORAL RINSE, AS DIRECTED ON PACKAGE, Disp: 473 mL, Rfl: 5   SODIUM FLUORIDE, DENTAL RINSE, (PREVIDENT) 0.2 % SOLN, USE AS AN ORAL RINSE, AS DIRECTED ON PACKAGE, Disp: 473 mL, Rfl: 5   valACYclovir (VALTREX) 500 MG tablet, Take 1 tablet (500 mg total) by mouth as directed.,  Disp: 90 tablet, Rfl: 1   Allergies  Allergen Reactions   Penicillins      Review of Systems  Constitutional: Negative.   HENT: Negative.    Eyes: Negative.   Respiratory:  Negative for apnea, cough and shortness of breath.   Gastrointestinal: Negative.   Endocrine: Negative for cold intolerance, polydipsia, polyphagia and polyuria.  Musculoskeletal: Negative.   Skin: Negative.   Psychiatric/Behavioral: Negative.       Today's Vitals   07/05/23 1546  BP: (!) 140/82  Pulse: 85  Temp: 98.8 F (37.1 C)  TempSrc: Oral  Weight: 105 lb (47.6 kg)  Height: 5' (1.524 m)  PainSc: 0-No pain   Body mass index is 20.51 kg/m.  Wt Readings from Last 3 Encounters:  07/05/23 105 lb (47.6 kg)  03/27/23 102 lb 12.8 oz (46.6 kg)  02/07/23 104 lb (47.2 kg)    The 10-year ASCVD risk score (Arnett DK, et al., 2019) is: 39%   Values used  to calculate the score:     Age: 71 years     Sex: Female     Is Non-Hispanic African American: Yes     Diabetic: Yes     Tobacco smoker: No     Systolic Blood Pressure: 140 mmHg     Is BP treated: No     HDL Cholesterol: 78 mg/dL     Total Cholesterol: 205 mg/dL  Objective:  Physical Exam HENT:     Head: Normocephalic.  Cardiovascular:     Rate and Rhythm: Normal rate and regular rhythm.  Abdominal:     General: Bowel sounds are normal.  Neurological:     Mental Status: She is alert and oriented to person, place, and time. Mental status is at baseline.  Psychiatric:        Mood and Affect: Mood normal.         Assessment And Plan:  Type 2 diabetes mellitus with hyperglycemia, without long-term current use of insulin (HCC) Assessment & Plan: Continue metformin 750 mg twice daily  Orders: -     Microalbumin / creatinine urine ratio -     metFORMIN HCl; Take 1.5 tablets (750 mg total) by mouth 2 (two) times daily with a meal.  Dispense: 270 tablet; Refill: 1  Aortic atherosclerosis (HCC) Assessment & Plan: Continue crestor 10 mg every day      Return in 3 months (on 10/05/2023), or if symptoms worsen or fail to improve, for diabetes.  Patient was given opportunity to ask questions. Patient verbalized understanding of the plan and was able to repeat key elements of the plan. All questions were answered to their satisfaction.    I, Ellender Hose, NP, have reviewed all documentation for this visit. The documentation on 07/09/2023 for the exam, diagnosis, procedures, and orders are all accurate and complete.  .   IF YOU HAVE BEEN REFERRED TO A SPECIALIST, IT MAY TAKE 1-2 WEEKS TO SCHEDULE/PROCESS THE REFERRAL. IF YOU HAVE NOT HEARD FROM US/SPECIALIST IN TWO WEEKS, PLEASE GIVE [place] A CALL AT [phone].

## 2023-07-05 NOTE — Patient Instructions (Signed)
 Prediabetes: What to Know Prediabetes is when your blood sugar, also called glucose, is at a higher level than normal but not high enough for you to be diagnosed with type 2 diabetes (type 2 diabetes mellitus). Having prediabetes puts you at risk for getting type 2 diabetes. By making some healthy changes, you may be able to prevent or delay getting type 2 diabetes. This is important because type 2 diabetes can lead to serious problems. Some of these include: Heart disease. Stroke. Blindness. Kidney disease. Depression. Poor blood flow in the feet and legs. In very bad cases, this could lead to having a leg removed by surgery (amputation). What are the causes? The exact cause of prediabetes isn't known. It may result from insulin resistance. Insulin resistance happens when cells in the body don't respond properly to insulin that the body makes. This can cause too much sugar to build up in the blood. High blood sugar, also called hyperglycemia, can develop. What increases the risk? Having a family member with type 2 diabetes. Being older than 73 years of age. Having had a temporary form of diabetes during a pregnancy. This is called gestational diabetes. Having had polycystic ovary syndrome (PCOS). Being overweight or obese. Being inactive and not getting much exercise. Having a history of heart disease. This may include problems with cholesterol levels, high levels of blood fats, or high blood pressure. What are the signs or symptoms? You may have no symptoms. If you do have symptoms, they may include: Increased hunger. Increased thirst. Needing to pee more often. Changes in how you see, like blurry vision. Feeling tired. How is this diagnosed? Prediabetes can be diagnosed with blood tests that check your blood sugar. One or more of these tests may be done: A fasting blood glucose (FBG) test. You won't be allowed to eat (you will fast) for at least 8 hours before a blood sample is  taken. An A1C blood test, also called a hemoglobin A1C test. This test shows information about blood sugar levels over the past 2?3 months. An oral glucose tolerance test (OGTT). This test measures your blood sugar at two points in time: After you haven't eaten for a while. This is your baseline level. Two hours after you drink a beverage that has sugar in it. You may be diagnosed with prediabetes if: Your FBG is 100?125 mg/dL (1.6-1.0 mmol/L). Your A1C level is 5.7?6.4% (39-46 mmol/mol). Your OGTT result is 140?199 mg/dL (9.6-04 mmol/L). These blood tests may need to be done again to be sure of the diagnosis. How is this treated? Treatment may include making changes to your diet and lifestyle. These changes can help lower your blood sugar and keep you from getting type 2 diabetes. In some cases, medicine may be given to help lower your risk. Follow these instructions at home: Eating and drinking  Eat and drink as told. Follow a healthy meal plan. This includes eating lean proteins, whole grains, legumes, fresh fruits and vegetables, low-fat dairy products, and healthy fats. Meet with an expert in healthy eating called a dietitian. This person can help create a healthy eating plan that's right for you. Lifestyle Do moderate-intensity exercise. Do this for at least 30 minutes a day on 5 or more days each week, or as told by your health care provider. A mix of activities may be best. Good choices include brisk walking, swimming, biking, and weight lifting. Try to lose weight if your provider says it's OK. Losing 5-7% of your body weight can  help reverse insulin resistance. Do not drink alcohol if: Your provider tells you not to drink. You're pregnant, may be pregnant, or plan to become pregnant. If you drink alcohol: Limit how much you have to: 0-1 drink a day if you're female. 0-2 drinks a day if you're female. Know how much alcohol is in your drink. In the [place] one drink is one 12 oz  bottle of beer (355 mL), one 5 oz glass of wine (148 mL), or one 1 oz glass of hard liquor (44 mL). General instructions Take medicines only as told. You may be given medicines that help lower the risk of type 2 diabetes. Do not smoke, vape, or use nicotine or tobacco. Where to find more information American Diabetes Association: diabetes.org/about-diabetes/prediabetes Academy of Nutrition and Dietetics: eatright.org American Heart Association: Go to ThisJobs.cz. Click the search icon. Type "prediabetes" in the search box. Contact a health care provider if: You have any of these symptoms: Increased hunger. Peeing more often than usual. Increased thirst. Feeling tired. Changes in how you see, like blurry vision. Feeling like you may throw up. Throwing up. Get help right away if: You have shortness of breath. You feel confused. This information is not intended to replace advice given to you by your health care provider. Make sure you discuss any questions you have with your health care provider. Document Revised: 10/29/2022 Document Reviewed: 10/29/2022 Elsevier Patient Education  2024 [employer].

## 2023-07-06 LAB — MICROALBUMIN / CREATININE URINE RATIO
Creatinine, Urine: 65.5 mg/dL
Microalb/Creat Ratio: <5 mg/g{creat} (ref 0–29)
Microalbumin, Urine: <3 ug/mL

## 2023-07-09 ENCOUNTER — Encounter: Payer: Self-pay | Admitting: Family Medicine

## 2023-07-09 ENCOUNTER — Other Ambulatory Visit (HOSPITAL_COMMUNITY): Payer: Self-pay

## 2023-07-09 DIAGNOSIS — I700 Atherosclerosis of aorta: Secondary | ICD-10-CM | POA: Insufficient documentation

## 2023-07-09 NOTE — Assessment & Plan Note (Signed)
 Continue crestor 10 mg every day

## 2023-07-09 NOTE — Assessment & Plan Note (Signed)
Continue metformin 750 mg twice daily

## 2023-07-09 NOTE — Progress Notes (Signed)
Microalbumin is normal.

## 2023-07-25 ENCOUNTER — Ambulatory Visit: Payer: Commercial Managed Care - PPO | Admitting: Neurology

## 2023-08-01 ENCOUNTER — Ambulatory Visit: Admitting: Neurology

## 2023-08-01 VITALS — BP 131/78

## 2023-08-01 DIAGNOSIS — G5132 Clonic hemifacial spasm, left: Secondary | ICD-10-CM

## 2023-08-01 MED ORDER — ONABOTULINUMTOXINA 100 UNITS IJ SOLR
100.0000 [IU] | Freq: Once | INTRAMUSCULAR | Status: AC
  Administered 2023-08-01: 100 [IU] via INTRAMUSCULAR

## 2023-08-01 NOTE — Progress Notes (Signed)
    PATIENT: Jaime Rodriguez DOB: [birth year]  Chief Complaint  Patient presents with   Injections    Pt in 14,  Pt is here for Botox  injections for hemifacial spasm.      HISTORICAL   Jaime Rodriguez is a 73 y.o.  right-handed black female referred by Dr. Tilda Fogo for EMG guided botulinumtoxin injection for left facial spasm   She had a history of left-sided hemifacial spasm that began approximately a year ago in 2013. The patient was seen by Dr. Lamar Pillar, and she underwent MRI evaluation of the brain with and without gadolinium that was unremarkable. The patient underwent Botox  injections in 2013,   She did well with the Botox , and the benefit lasted almost one year. She did has mild left facial droop after the injection.   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    She denies any balance issues, speech problems or swallowing problems. The patient denies any change in vision. She denies a previous history of left Bell's palsy.   In recent 1 year, since 2014, She began to have urinary urgency, she never had children in the past she denies bilateral upper and lower extremity paresthesia    She is receiving every 3 months EMG guided Botox  injection for left hemifacial spasm  On exam: She has frequent left hemifacial muscle spasm, involving left orbicularis oculi, orbicularis oris, mild left cheek puff weakness. She has mild  left eye closure weakness.,  Asymmetry, low-lying left nasolabial fold   Assessment/Plan:   Left hemifacial spasm,      EMG guided Botox  a injection  100 units, dissolved into 2 cc of normal saline,   2.5 units at each injection sites  (2.5x 16) =40 units, discard 60 units     Jaime Rodriguez, M.D. Ph.D.    [facility] [address] [place], [place]  [address] Phone: [phone] Fax:      [phone]

## 2023-08-01 NOTE — Progress Notes (Signed)
 Botox - 100 units x  vials Lot: [id number] Expiration: 2027/05 NDC: 5409-8119-14  Bacteriostatic 0.9% Sodium Chloride- 2 mL  Lot: [id number] Expiration: 02/09/24 NDC: [id number]  Dx:  O96.29  B/B Witnessed by [facility] CMA

## 2023-08-03 ENCOUNTER — Other Ambulatory Visit: Payer: Self-pay | Admitting: Nurse Practitioner

## 2023-08-03 ENCOUNTER — Other Ambulatory Visit: Payer: Self-pay

## 2023-08-03 ENCOUNTER — Other Ambulatory Visit (HOSPITAL_COMMUNITY): Payer: Self-pay

## 2023-08-03 MED ORDER — LORATADINE 10 MG PO TABS
10.0000 mg | ORAL_TABLET | Freq: Every day | ORAL | 1 refills | Status: DC
  Filled 2023-08-03: qty 30, 30d supply, fill #0
  Filled 2023-09-07: qty 30, 30d supply, fill #1

## 2023-08-07 ENCOUNTER — Ambulatory Visit: Payer: Commercial Managed Care - PPO | Admitting: Podiatry

## 2023-08-08 ENCOUNTER — Encounter (INDEPENDENT_AMBULATORY_CARE_PROVIDER_SITE_OTHER): Payer: Self-pay

## 2023-08-08 ENCOUNTER — Ambulatory Visit (INDEPENDENT_AMBULATORY_CARE_PROVIDER_SITE_OTHER): Payer: Commercial Managed Care - PPO | Admitting: Otolaryngology

## 2023-08-08 VITALS — BP 144/82 | HR 80 | Ht 60.0 in | Wt 105.0 lb

## 2023-08-08 DIAGNOSIS — H6123 Impacted cerumen, bilateral: Secondary | ICD-10-CM | POA: Diagnosis not present

## 2023-08-08 DIAGNOSIS — H903 Sensorineural hearing loss, bilateral: Secondary | ICD-10-CM

## 2023-08-09 DIAGNOSIS — H6123 Impacted cerumen, bilateral: Secondary | ICD-10-CM | POA: Insufficient documentation

## 2023-08-09 DIAGNOSIS — H903 Sensorineural hearing loss, bilateral: Secondary | ICD-10-CM | POA: Insufficient documentation

## 2023-08-09 NOTE — Progress Notes (Signed)
 Patient ID: Jaime Rodriguez, female   DOB: 1951-03-07, 73 y.o.   MRN: [id number]  Follow-up: Hearing loss  HPI: The patient is a 73 year old female who returns today for her follow-up evaluation.  The patient was last seen in April 2024.  At that time, she was complaining of bilateral progressive hearing loss.  She was noted to have bilateral mild to moderate sensorineural hearing loss, likely secondary to presbycusis.  She was fitted with bilateral hearing aids.  The patient returns today reporting no significant change in her hearing.  Her hearing has improved with the use of her hearing aids.  She denies any otalgia, otorrhea, or vertigo.  Exam: General: Communicates without difficulty, well nourished, no acute distress. Head: Normocephalic, no evidence injury, no tenderness, facial buttresses intact without stepoff. Face/sinus: No tenderness to palpation and percussion. Facial movement is normal and symmetric. Eyes: PERRL, EOMI. No scleral icterus, conjunctivae clear. Neuro: CN II exam reveals vision grossly intact.  No nystagmus at any point of gaze. Ears: Auricles well formed without lesions.  Bilateral cerumen impaction.  Nose: External evaluation reveals normal support and skin without lesions.  Dorsum is intact.  Anterior rhinoscopy reveals normal mucosa over anterior aspect of inferior turbinates and intact septum.  No purulence noted. Oral:  Oral cavity and oropharynx are intact, symmetric, without erythema or edema.  Mucosa is moist without lesions. Neck: Full range of motion without pain.  There is no significant lymphadenopathy.  No masses palpable.  Thyroid  bed within normal limits to palpation.  Parotid glands and submandibular glands equal bilaterally without mass.  Trachea is midline. Neuro:  CN 2-12 grossly intact.   Procedure: Bilateral cerumen disimpaction Anesthesia: None Description: Under the operating microscope, the cerumen is carefully removed with a combination of cerumen  currette, alligator forceps, and suction catheters.  After the cerumen is removed, the TMs are noted to be normal.  No mass, erythema, or lesions. The patient tolerated the procedure well.   Assessment: 1.  Bilateral cerumen impaction.  After the cerumen disimpaction procedure, both tympanic membranes and middle ear spaces are noted to be normal. 2.  Subjectively stable bilateral sensorineural hearing loss.  Plan: 1.  Otomicroscopy with bilateral cerumen disimpaction. 2.  The physical exam findings are reviewed with the patient. 3.  Continue the use of her hearing aids. 4.  The patient will return for reevaluation in 1 year.

## 2023-08-22 ENCOUNTER — Telehealth: Payer: Self-pay

## 2023-08-22 NOTE — Telephone Encounter (Signed)
 Called patient to see if she could come 08/29/2023 for the mobile mammogram bus.

## 2023-08-24 ENCOUNTER — Ambulatory Visit (INDEPENDENT_AMBULATORY_CARE_PROVIDER_SITE_OTHER): Payer: Self-pay | Admitting: Audiology

## 2023-08-24 DIAGNOSIS — H903 Sensorineural hearing loss, bilateral: Secondary | ICD-10-CM

## 2023-08-24 NOTE — Progress Notes (Signed)
  [address], [address] [place], [place] [address] [phone]  Hearing Aid Check     Jaime Rodriguez comes for a scheduled appointment for a hearing aid check.    Accompanied: yes   Right Left  Hearing aid manufacturer Resound Fontana 9 R [id number]: [id number] Tacey Exon 9 R SN: [id number]  Hearing aid style Receiver in the canal Receiver in the canal  Hearing aid battery rechargeable rechargeable  Receiver    Dome/ custom earpiece    Retention wire No (used to) No (used to)  Warranty expiration date 11-22-2025 11-22-2025  Loss and Damage unknown unknown  Initial fitting date 10-24-2022 10-24-2022  Device was fit at: [facility]    Chief complaint: Patient reports would like to have the retention wires removed.   Actions taken: The receiver wire was not sitting the correct way. Patient still wanted them to be removed. Removed retention wires and cleaned the hearing aids.   Services fee: $0 was paid at checkout.    Recommend: Return for a hearing aid check , as needed. Return for a hearing evaluation and to see an ENT, if concerns with hearing changes arise.    [name] MARIE LEROUX-MARTINEZ, AUD

## 2023-08-28 ENCOUNTER — Other Ambulatory Visit (HOSPITAL_COMMUNITY): Payer: Self-pay

## 2023-08-29 ENCOUNTER — Ambulatory Visit: Payer: Commercial Managed Care - PPO | Admitting: Podiatry

## 2023-08-29 ENCOUNTER — Encounter: Payer: Self-pay | Admitting: Podiatry

## 2023-08-29 DIAGNOSIS — E119 Type 2 diabetes mellitus without complications: Secondary | ICD-10-CM | POA: Diagnosis not present

## 2023-08-29 DIAGNOSIS — L84 Corns and callosities: Secondary | ICD-10-CM

## 2023-08-29 DIAGNOSIS — K5900 Constipation, unspecified: Secondary | ICD-10-CM | POA: Insufficient documentation

## 2023-09-03 NOTE — Progress Notes (Signed)
  Subjective:  Patient ID: Jaime Rodriguez, female    DOB: 12-Jun-1950,  MRN: [id number]  Jaime Rodriguez presents to clinic today for preventative diabetic foot care and painful porokeratotic lesion(s) left foot and painful mycotic toenails that limit ambulation. Painful toenails interfere with ambulation. Aggravating factors include wearing enclosed shoe gear. Pain is relieved with periodic professional debridement. Painful porokeratotic lesions are aggravated when weightbearing with and without shoegear. Pain is relieved with periodic professional debridement.  New problem(s): None.   PCP is Susanna Epley, FNP.  Allergies  Allergen Reactions   Penicillins     Review of Systems: Negative except as noted in the HPI.  Objective: No changes noted in today's physical examination. There were no vitals filed for this visit. Jaime Rodriguez is a pleasant 73 y.o. female in NAD. AAO x 3.  Vascular Examination: Capillary refill time immediate b/l. Vascular status intact b/l with palpable pedal pulses. Pedal hair present b/l. No pain with calf compression b/l. Skin temperature gradient WNL b/l. No cyanosis or clubbing b/l. No ischemia or gangrene noted b/l.   Neurological Examination: Sensation grossly intact b/l with 10 gram monofilament. Vibratory sensation intact b/l.   Dermatological Examination: Pedal skin with normal turgor, texture and tone b/l.  No open wounds. No interdigital macerations.   Toenails 1-5 mildly elongated with subungual debris and pain on dorsal palpation.   Porokeratotic lesion(s) submet head 5 left foot. No erythema, no edema, no drainage, no fluctuance.  Musculoskeletal Examination: Muscle strength 5/5 to all lower extremity muscle groups bilaterally. Pes planus deformity noted bilateral LE.  Radiographs: None  Last A1c:      Latest Ref Rng & Units 07/02/2023   12:07 PM 12/25/2022    8:05 AM  Hemoglobin A1C  Hemoglobin-A1c 4.8 - 5.6 % 6.6  6.2     Assessment/Plan: 1. Callus of foot   2. Type 2 diabetes mellitus without complication, without long-term current use of insulin (HCC)   -Consent given for treatment as described below: -Examined patient. -As a courtesy, toenails 1-5 b/l were debrided in length and girth without incident. -Patient to continue soft, supportive shoe gear daily. -Porokeratotic lesion(s) submet head 5 left foot pared and enucleated with sterile currette without incident. Total number of lesions debrided=1. -Offloaded callus(es)/porokeratotic lesion(s) submet head 5 left foot with felt dancers pad applied to insole(s) of shoe. -Patient/POA to call should there be question/concern in the interim.   Return in about 3 months (around 11/29/2023).  Jaime Rodriguez, DPM      [facility] LOCATION: [address] [place] [address]                   Office [phone]   [facility] LOCATION: [address] [place], [place] [address] Office [phone]

## 2023-09-07 ENCOUNTER — Other Ambulatory Visit (HOSPITAL_COMMUNITY): Payer: Self-pay

## 2023-09-11 ENCOUNTER — Telehealth: Payer: Self-pay | Admitting: Neurology

## 2023-09-11 NOTE — Telephone Encounter (Signed)
 LVM and sent mychart msg informing pt of r/s needed for 7/16 appt- MD out.

## 2023-10-01 ENCOUNTER — Other Ambulatory Visit: Payer: Self-pay | Admitting: Nurse Practitioner

## 2023-10-01 ENCOUNTER — Telehealth: Payer: Self-pay

## 2023-10-01 ENCOUNTER — Telehealth: Payer: Self-pay | Admitting: Neurology

## 2023-10-01 DIAGNOSIS — E1165 Type 2 diabetes mellitus with hyperglycemia: Secondary | ICD-10-CM

## 2023-10-01 DIAGNOSIS — E782 Mixed hyperlipidemia: Secondary | ICD-10-CM

## 2023-10-01 NOTE — Telephone Encounter (Signed)
 Copied from CRM [id number]. Topic: Clinical - Request for Lab/Test Order >> Oct 01, 2023  2:48 PM Willma SAUNDERS wrote: Reason for CRM: Patient calling in regards to her upcoming appointment on 06/26. Is requesting that if there are any labs she needs done for her appointment she would like to have them done in advance at [facility] at [address] [facility] [place], [place] [address], [phone].  Patients niece Bonnielee can be reached [phone]  LABS HAVE BEEN FAXED.

## 2023-10-03 DIAGNOSIS — E1165 Type 2 diabetes mellitus with hyperglycemia: Secondary | ICD-10-CM | POA: Diagnosis not present

## 2023-10-03 DIAGNOSIS — E782 Mixed hyperlipidemia: Secondary | ICD-10-CM | POA: Diagnosis not present

## 2023-10-04 ENCOUNTER — Ambulatory Visit: Admitting: Nurse Practitioner

## 2023-10-04 VITALS — BP 105/55 | HR 103 | Temp 97.8°F | Ht 60.0 in | Wt 102.0 lb

## 2023-10-04 DIAGNOSIS — R292 Abnormal reflex: Secondary | ICD-10-CM

## 2023-10-04 DIAGNOSIS — I700 Atherosclerosis of aorta: Secondary | ICD-10-CM

## 2023-10-04 DIAGNOSIS — E782 Mixed hyperlipidemia: Secondary | ICD-10-CM

## 2023-10-04 DIAGNOSIS — E1165 Type 2 diabetes mellitus with hyperglycemia: Secondary | ICD-10-CM

## 2023-10-04 LAB — BMP8+EGFR
BUN/Creatinine Ratio: 19 (ref 12–28)
BUN: 16 mg/dL (ref 8–27)
CO2: 22 mmol/L (ref 20–29)
Calcium: 9.7 mg/dL (ref 8.7–10.3)
Chloride: 103 mmol/L (ref 96–106)
Creatinine, Ser: 0.86 mg/dL (ref 0.57–1.00)
Glucose: 84 mg/dL (ref 70–99)
Potassium: 4.6 mmol/L (ref 3.5–5.2)
Sodium: 141 mmol/L (ref 134–144)
eGFR: 71 mL/min/{1.73_m2} (ref >59)

## 2023-10-04 LAB — LIPID PANEL
Chol/HDL Ratio: 2.7 ratio (ref 0.0–4.4)
Cholesterol, Total: 214 mg/dL — ABNORMAL HIGH (ref 100–199)
HDL: 78 mg/dL (ref >39)
LDL Chol Calc (NIH): 126 mg/dL — ABNORMAL HIGH (ref 0–99)
Triglycerides: 54 mg/dL (ref 0–149)
VLDL Cholesterol Cal: 10 mg/dL (ref 5–40)

## 2023-10-04 LAB — HEMOGLOBIN A1C
Est. average glucose Bld gHb Est-mCnc: 134 mg/dL
Hgb A1c MFr Bld: 6.3 % — ABNORMAL HIGH (ref 4.8–5.6)

## 2023-10-04 NOTE — Progress Notes (Signed)
 I,Jameka J Rodriguez, CMA,acting as a Neurosurgeon for [employer], FNP.,have documented all relevant documentation on the behalf of Jaime Ada, FNP,as directed by  Jaime Ada, FNP while in the presence of Jaime Ada, FNP.  Subjective:  Patient ID: Jaime Rodriguez , female    DOB: 12/19/1950 , 73 y.o.   MRN: [id number]  Chief Complaint  Patient presents with   Diabetes    Patient presents today for a diabetes check. Patient reports compliance with her meds. Patient denies having chest pain,sob or headaches at this time. Patient doesn't have any questions or concerns at this time.     HPI  Patient is taking metformin  1000 mg BID.  If she takes the prescribed 1500 mg she becomes nauseous and has abdominal pain.  She is checking her blood sugars weekly in the mornings and gets readings 80-90.    Breakfast- oatmeal, nuts, berries Lunch- pot pie, meat and vegetables, left overs Dinner- salad, meat and vegetables. Water intake is 2-3 bottles daily, feels that she is able to drink more when she is not working.  Endorses mild swelling in feet and lower legs, non pitting, no pain or tingling.  States that this is normal for her because she is on her feet most days at work.          Diabetes She presents for her follow-up diabetic visit. She has type 2 diabetes mellitus. No MedicAlert identification noted. Her disease course has been stable. There are no hypoglycemic associated symptoms. Pertinent negatives for hypoglycemia include no headaches. There are no diabetic associated symptoms. Pertinent negatives for diabetes include no chest pain, no polydipsia, no polyphagia, no polyuria and no weakness. There are no hypoglycemic complications. Symptoms are stable. There are no diabetic complications. Risk factors for coronary artery disease include diabetes mellitus and sedentary lifestyle. Current diabetic treatment includes diet and oral agent (monotherapy). She is compliant with treatment all of  the time. Her weight is stable. She is following a generally healthy diet. Meal planning includes avoidance of concentrated sweets. She has not had a previous visit with a dietitian. She participates in exercise weekly. Her breakfast blood glucose is taken between 6-7 am. Her breakfast blood glucose range is generally 70-90 mg/dl. She sees a podiatrist.Eye exam is current.     Past Medical History:  Diagnosis Date   Allergy    Arthritis Unknown   Diabetes mellitus    Fibrocystic disease of breast    LEFT BREAST SURGERY   Hemifacial spasm 12/18/2012   Left lower face   Hyperlipidemia    Osteoporosis Unknown   Postmenopausal      Family History  Problem Relation Age of Onset   Cancer Mother    Other Mother        sepsis   Stroke Father    Prostate cancer Brother    Cancer Maternal Grandmother    Asthma Sister    Hyperlipidemia Sister    Diabetes Sister    COPD Sister    Heart disease Sister    Hypertension Sister    Cancer Brother    Hypertension Sister    Anxiety disorder Other    Diabetes Other    Hyperlipidemia Other    Cancer Brother      Current Outpatient Medications:    Blood Glucose Monitoring Suppl (FREESTYLE LITE) w/Device KIT, Use 2 times a day to test blood sugar before breakfast and dinner, Disp: 1 kit, Rfl: 1   botulinum toxin Type A  (BOTOX ) 100 units  SOLR injection, Inject 100 Units into the muscle every 3 (three) months., Disp: , Rfl:    diclofenac  sodium (VOLTAREN ) 1 % GEL, Apply 2 g topically 4 (four) times daily as needed., Disp: 1 Tube, Rfl: 3   glucose blood (FREESTYLE LITE) test strip, USE TWICE A DAY BEFORE BREAKFAST & DINNER AS DIRECTED, Disp: 100 strip, Rfl: 12   Lancets (FREESTYLE) lancets, USE TWICE A DAY BEFORE BREAKFAST & DINNER AS DIRECTED, Disp: 100 each, Rfl: 12   meclizine  (ANTIVERT ) 12.5 MG tablet, Take 1 tablet (12.5 mg total) by mouth 3 (three) times daily as needed for dizziness., Disp: 30 tablet, Rfl: 2   metFORMIN  (GLUCOPHAGE ) 500 MG  tablet, Take 1.5 tablets (750 mg total) by mouth 2 (two) times daily with a meal., Disp: 270 tablet, Rfl: 1   rosuvastatin  (CRESTOR ) 20 MG tablet, Take 1 tablet (20 mg total) by mouth daily., Disp: 90 tablet, Rfl: 1   SODIUM FLUORIDE , DENTAL RINSE, (PREVIDENT ) 0.2 % SOLN, USE AS AN ORAL RINSE, AS DIRECTED ON PACKAGE, Disp: 473 mL, Rfl: 5   SODIUM FLUORIDE , DENTAL RINSE, (PREVIDENT ) 0.2 % SOLN, USE AS AN ORAL RINSE, AS DIRECTED ON PACKAGE, Disp: 473 mL, Rfl: 5   valACYclovir  (VALTREX ) 500 MG tablet, Take 1 tablet (500 mg total) by mouth as directed., Disp: 90 tablet, Rfl: 1   VITAMIN E PO, Take 1 tablet by mouth daily at 6 (six) AM., Disp: , Rfl:    loratadine  (CLARITIN ) 10 MG tablet, Take 1 tablet (10 mg total) by mouth daily., Disp: 30 tablet, Rfl: 1   Allergies  Allergen Reactions   Penicillins      Review of Systems  Constitutional: Negative.  Negative for appetite change, chills and fever.  HENT:  Negative for congestion.   Eyes: Negative.   Respiratory:  Negative for chest tightness, shortness of breath and wheezing.   Cardiovascular:  Positive for leg swelling (mild, non pitting). Negative for chest pain and palpitations.  Gastrointestinal:  Negative for constipation, diarrhea, nausea and vomiting.  Endocrine: Negative for polydipsia, polyphagia and polyuria.  Musculoskeletal: Negative.  Negative for arthralgias and myalgias.  Skin: Negative.   Neurological:  Negative for weakness and headaches.  Psychiatric/Behavioral: Negative.       Today's Vitals   10/04/23 1451  BP: (!) 105/55  Pulse: (!) 103  Temp: 97.8 F (36.6 C)  TempSrc: Oral  Weight: 102 lb (46.3 kg)  Height: 5' (1.524 m)  PainSc: 0-No pain   Body mass index is 19.92 kg/m.  Wt Readings from Last 3 Encounters:  10/04/23 102 lb (46.3 kg)  08/08/23 105 lb (47.6 kg)  07/05/23 105 lb (47.6 kg)      Objective:  Physical Exam Constitutional:      General: She is not in acute distress.    Appearance: Normal  appearance.  HENT:     Head: Normocephalic and atraumatic.  Neck:     Vascular: No carotid bruit.  Cardiovascular:     Rate and Rhythm: Normal rate and regular rhythm.     Pulses: Normal pulses.     Heart sounds: Normal heart sounds.  Pulmonary:     Effort: Pulmonary effort is normal. No respiratory distress.     Breath sounds: Normal breath sounds.  Musculoskeletal:     Right lower leg: Edema (mild, non pitting, positive pedal pulses) present.     Left lower leg: Edema (mild, non pitting, positive pedal pulses) present.  Skin:    General: Skin is warm and  dry.     Capillary Refill: Capillary refill takes less than 2 seconds.  Neurological:     General: No focal deficit present.     Mental Status: She is alert and oriented to person, place, and time. Mental status is at baseline.  Psychiatric:        Mood and Affect: Mood normal.        Behavior: Behavior normal.        Thought Content: Thought content normal.        Judgment: Judgment normal.         Assessment And Plan:  Type 2 diabetes mellitus with hyperglycemia, without long-term current use of insulin (HCC) Assessment & Plan: HgbA1c is stable. Continue current medications and focusing on healthy diet.    Mixed hyperlipidemia Assessment & Plan: Patient not due to see cardiology for 6-7 months, lipid panel trending up on Crestor  10 mg daily.  Crestor  changed to 20 mg daily, calcium  index scores reviewed.  Liver panel to be drawn in 4 weeks, patient aware and agreeable.    Orders: -     Rosuvastatin  Calcium ; Take 1 tablet (20 mg total) by mouth daily.  Dispense: 90 tablet; Refill: 1 -     Hepatic function panel; Future  Aortic atherosclerosis (HCC) Assessment & Plan: Continue statin, tolerating well.    Hyperreflexia    Return if symptoms worsen or fail to improve.  Patient was given opportunity to ask questions. Patient verbalized understanding of the plan and was able to repeat key elements of the plan. All  questions were answered to their satisfaction.   I have reviewed this encounter including the documentation in this note and/or discussed this patient with Delon Louder FNP Student. I am certifying that I agree with the content of this note as the primary care nurse practitioner.  Jaime Ada, DNP, FNP-BC  I, Jaime Ada, FNP, have reviewed all documentation for this visit. The documentation on 10/04/23 for the exam, diagnosis, procedures, and orders are all accurate and complete.   IF YOU HAVE BEEN REFERRED TO A SPECIALIST, IT MAY TAKE 1-2 WEEKS TO SCHEDULE/PROCESS THE REFERRAL. IF YOU HAVE NOT HEARD FROM US /SPECIALIST IN TWO WEEKS, PLEASE GIVE US  A CALL AT [phone].

## 2023-10-04 NOTE — Assessment & Plan Note (Addendum)
 Patient not due to see cardiology for 6-7 months, lipid panel trending up on Crestor  10 mg daily.  Crestor  changed to 20 mg daily, calcium  index scores reviewed.  Liver panel to be drawn in 4 weeks, patient aware and agreeable.

## 2023-10-08 ENCOUNTER — Other Ambulatory Visit (HOSPITAL_COMMUNITY): Payer: Self-pay

## 2023-10-08 ENCOUNTER — Other Ambulatory Visit: Payer: Self-pay | Admitting: Nurse Practitioner

## 2023-10-08 DIAGNOSIS — R292 Abnormal reflex: Secondary | ICD-10-CM

## 2023-10-09 ENCOUNTER — Other Ambulatory Visit: Payer: Self-pay | Admitting: Nurse Practitioner

## 2023-10-09 ENCOUNTER — Other Ambulatory Visit: Payer: Self-pay

## 2023-10-09 ENCOUNTER — Other Ambulatory Visit (HOSPITAL_COMMUNITY): Payer: Self-pay

## 2023-10-09 MED ORDER — LORATADINE 10 MG PO TABS
10.0000 mg | ORAL_TABLET | Freq: Every day | ORAL | 1 refills | Status: DC
  Filled 2023-10-09: qty 30, 30d supply, fill #0
  Filled 2023-11-09: qty 30, 30d supply, fill #1

## 2023-10-10 ENCOUNTER — Other Ambulatory Visit (HOSPITAL_COMMUNITY): Payer: Self-pay

## 2023-10-11 ENCOUNTER — Other Ambulatory Visit (HOSPITAL_COMMUNITY): Payer: Self-pay

## 2023-10-11 ENCOUNTER — Other Ambulatory Visit: Payer: Self-pay | Admitting: Nurse Practitioner

## 2023-10-11 DIAGNOSIS — R292 Abnormal reflex: Secondary | ICD-10-CM

## 2023-10-13 ENCOUNTER — Ambulatory Visit: Payer: Self-pay | Admitting: Nurse Practitioner

## 2023-10-13 ENCOUNTER — Encounter: Payer: Self-pay | Admitting: Nurse Practitioner

## 2023-10-13 MED ORDER — ROSUVASTATIN CALCIUM 20 MG PO TABS
20.0000 mg | ORAL_TABLET | Freq: Every day | ORAL | 1 refills | Status: DC
  Filled 2023-10-13: qty 90, 90d supply, fill #0
  Filled 2024-01-09: qty 90, 90d supply, fill #1

## 2023-10-13 NOTE — Assessment & Plan Note (Signed)
 Continue statin, tolerating well

## 2023-10-13 NOTE — Assessment & Plan Note (Addendum)
 HgbA1c is stable. Continue current medications and focusing on healthy diet. Discussed improvement of A1c during visit.

## 2023-10-15 ENCOUNTER — Other Ambulatory Visit (HOSPITAL_COMMUNITY): Payer: Self-pay

## 2023-10-16 ENCOUNTER — Other Ambulatory Visit: Payer: Self-pay | Admitting: Obstetrics and Gynecology

## 2023-10-16 DIAGNOSIS — Z1231 Encounter for screening mammogram for malignant neoplasm of breast: Secondary | ICD-10-CM

## 2023-10-24 ENCOUNTER — Ambulatory Visit: Admitting: Neurology

## 2023-10-26 ENCOUNTER — Other Ambulatory Visit (HOSPITAL_COMMUNITY): Payer: Self-pay

## 2023-10-31 ENCOUNTER — Other Ambulatory Visit (HOSPITAL_COMMUNITY): Payer: Self-pay

## 2023-11-09 ENCOUNTER — Other Ambulatory Visit (HOSPITAL_COMMUNITY): Payer: Self-pay

## 2023-11-14 ENCOUNTER — Ambulatory Visit: Admitting: Neurology

## 2023-11-14 VITALS — BP 145/83 | HR 66 | Ht 60.0 in | Wt 103.0 lb

## 2023-11-14 DIAGNOSIS — G5132 Clonic hemifacial spasm, left: Secondary | ICD-10-CM | POA: Diagnosis not present

## 2023-11-14 MED ORDER — ONABOTULINUMTOXINA 100 UNITS IJ SOLR
100.0000 [IU] | Freq: Once | INTRAMUSCULAR | Status: AC
Start: 2023-11-14 — End: 2023-11-17
  Administered 2023-11-17: 100 [IU] via INTRAMUSCULAR

## 2023-11-14 NOTE — Progress Notes (Signed)
 Botox - 100 units x 1 vial Lot: [id number] Expiration: 2027/10 NDC: 9976-8854-98  Bacteriostatic 0.9% Sodium Chloride- 2 mL  Lot: [id number] Expiration: 02/07/25 NDC: 9590803397  Dx: H48.67  B/B Witnessed by DELENA MOLT RN

## 2023-11-17 DIAGNOSIS — G5132 Clonic hemifacial spasm, left: Secondary | ICD-10-CM | POA: Diagnosis not present

## 2023-11-17 NOTE — Progress Notes (Signed)
    PATIENT: Jaime Rodriguez DOB: Aug 06, 1950  Chief Complaint  Patient presents with   Injections    Room 15 EMG Botox   pt is welling      HISTORICAL   Jaime Rodriguez is a 73 y.o.  right-handed black female referred by Dr. Jenel for EMG guided botulinumtoxin injection for left facial spasm   She had a history of left-sided hemifacial spasm that began approximately a year ago in 2013. The patient was seen by Dr. Lindy, and she underwent MRI evaluation of the brain with and without gadolinium that was unremarkable. The patient underwent Botox  injections in 2013,   She did well with the Botox , and the benefit lasted almost one year. She did has mild left facial droop after the injection.   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    She denies any balance issues, speech problems or swallowing problems. The patient denies any change in vision. She denies a previous history of left Bell's palsy.   In recent 1 year, since 2014, She began to have urinary urgency, she never had children in the past she denies bilateral upper and lower extremity paresthesia    She is receiving every 3 months EMG guided Botox  injection for left hemifacial spasm  On exam: She has frequent left hemifacial muscle spasm, involving left orbicularis oculi, orbicularis oris, mild left cheek puff weakness. She has mild  left eye closure weakness.,  Asymmetry, low-lying left nasolabial fold   Assessment/Plan:   Left hemifacial spasm,      EMG guided Botox  a injection  100 units, dissolved into 2 cc of normal saline,   2.5 units at each injection sites  (2.5x 19) =47.5 units, discard 52.5 units     Byard Carranza, M.D. Ph.D.    [facility] [address] [place], [place] [address] Phone: [phone] Fax:       [phone]

## 2023-12-25 ENCOUNTER — Encounter: Payer: Self-pay | Admitting: Nurse Practitioner

## 2023-12-27 ENCOUNTER — Other Ambulatory Visit: Payer: Self-pay | Admitting: Nurse Practitioner

## 2023-12-27 DIAGNOSIS — E1165 Type 2 diabetes mellitus with hyperglycemia: Secondary | ICD-10-CM

## 2023-12-27 DIAGNOSIS — Z0000 Encounter for general adult medical examination without abnormal findings: Secondary | ICD-10-CM

## 2023-12-27 DIAGNOSIS — E782 Mixed hyperlipidemia: Secondary | ICD-10-CM

## 2023-12-28 DIAGNOSIS — Z0000 Encounter for general adult medical examination without abnormal findings: Secondary | ICD-10-CM | POA: Diagnosis not present

## 2023-12-28 DIAGNOSIS — E782 Mixed hyperlipidemia: Secondary | ICD-10-CM | POA: Diagnosis not present

## 2023-12-28 DIAGNOSIS — E1165 Type 2 diabetes mellitus with hyperglycemia: Secondary | ICD-10-CM | POA: Diagnosis not present

## 2023-12-29 LAB — HEMOGLOBIN A1C
Est. average glucose Bld gHb Est-mCnc: 140 mg/dL
Hgb A1c MFr Bld: 6.5 % — ABNORMAL HIGH (ref 4.8–5.6)

## 2023-12-29 LAB — CBC WITH DIFFERENTIAL/PLATELET
Basophils Absolute: 0 x10E3/uL (ref 0.0–0.2)
Basos: 1 %
EOS (ABSOLUTE): 0.1 x10E3/uL (ref 0.0–0.4)
Eos: 1 %
Hematocrit: 37.1 % (ref 34.0–46.6)
Hemoglobin: 11.8 g/dL (ref 11.1–15.9)
Immature Grans (Abs): 0 x10E3/uL (ref 0.0–0.1)
Immature Granulocytes: 0 %
Lymphocytes Absolute: 1.2 x10E3/uL (ref 0.7–3.1)
Lymphs: 28 %
MCH: 31.5 pg (ref 26.6–33.0)
MCHC: 31.8 g/dL (ref 31.5–35.7)
MCV: 99 fL — ABNORMAL HIGH (ref 79–97)
Monocytes Absolute: 0.4 x10E3/uL (ref 0.1–0.9)
Monocytes: 10 %
Neutrophils Absolute: 2.6 x10E3/uL (ref 1.4–7.0)
Neutrophils: 60 %
Platelets: 283 x10E3/uL (ref 150–450)
RBC: 3.75 x10E6/uL — ABNORMAL LOW (ref 3.77–5.28)
RDW: 12.4 % (ref 11.7–15.4)
WBC: 4.3 x10E3/uL (ref 3.4–10.8)

## 2023-12-29 LAB — CMP14+EGFR
ALT: 13 IU/L (ref 0–32)
AST: 22 IU/L (ref 0–40)
Albumin: 4.6 g/dL (ref 3.8–4.8)
Alkaline Phosphatase: 75 IU/L (ref 49–135)
BUN/Creatinine Ratio: 14 (ref 12–28)
BUN: 15 mg/dL (ref 8–27)
Bilirubin Total: 0.2 mg/dL (ref 0.0–1.2)
CO2: 27 mmol/L (ref 20–29)
Calcium: 9.5 mg/dL (ref 8.7–10.3)
Chloride: 102 mmol/L (ref 96–106)
Creatinine, Ser: 1.08 mg/dL — ABNORMAL HIGH (ref 0.57–1.00)
Globulin, Total: 2.6 g/dL (ref 1.5–4.5)
Glucose: 85 mg/dL (ref 70–99)
Potassium: 4.5 mmol/L (ref 3.5–5.2)
Sodium: 141 mmol/L (ref 134–144)
Total Protein: 7.2 g/dL (ref 6.0–8.5)
eGFR: 54 mL/min/1.73 — ABNORMAL LOW (ref >59)

## 2023-12-29 LAB — LIPID PANEL
Chol/HDL Ratio: 2.7 ratio (ref 0.0–4.4)
Cholesterol, Total: 209 mg/dL — ABNORMAL HIGH (ref 100–199)
HDL: 77 mg/dL (ref >39)
LDL Chol Calc (NIH): 122 mg/dL — ABNORMAL HIGH (ref 0–99)
Triglycerides: 54 mg/dL (ref 0–149)
VLDL Cholesterol Cal: 10 mg/dL (ref 5–40)

## 2023-12-29 LAB — TSH+FREE T4
Free T4: 1.24 ng/dL (ref 0.82–1.77)
TSH: 2.25 u[IU]/mL (ref 0.450–4.500)

## 2023-12-31 ENCOUNTER — Encounter: Payer: Self-pay | Admitting: Pharmacist

## 2023-12-31 ENCOUNTER — Encounter: Payer: Self-pay | Admitting: Nurse Practitioner

## 2023-12-31 ENCOUNTER — Other Ambulatory Visit: Payer: Self-pay

## 2023-12-31 ENCOUNTER — Ambulatory Visit: Payer: Self-pay | Admitting: Nurse Practitioner

## 2023-12-31 VITALS — BP 120/64 | HR 99 | Temp 98.7°F | Ht 60.0 in | Wt 100.2 lb

## 2023-12-31 DIAGNOSIS — E1165 Type 2 diabetes mellitus with hyperglycemia: Secondary | ICD-10-CM

## 2023-12-31 DIAGNOSIS — E1122 Type 2 diabetes mellitus with diabetic chronic kidney disease: Secondary | ICD-10-CM

## 2023-12-31 DIAGNOSIS — K458 Other specified abdominal hernia without obstruction or gangrene: Secondary | ICD-10-CM | POA: Diagnosis not present

## 2023-12-31 DIAGNOSIS — E782 Mixed hyperlipidemia: Secondary | ICD-10-CM

## 2023-12-31 DIAGNOSIS — N1831 Chronic kidney disease, stage 3a: Secondary | ICD-10-CM

## 2023-12-31 DIAGNOSIS — R3121 Asymptomatic microscopic hematuria: Secondary | ICD-10-CM | POA: Diagnosis not present

## 2023-12-31 DIAGNOSIS — I700 Atherosclerosis of aorta: Secondary | ICD-10-CM

## 2023-12-31 DIAGNOSIS — R319 Hematuria, unspecified: Secondary | ICD-10-CM | POA: Diagnosis not present

## 2023-12-31 DIAGNOSIS — Z0000 Encounter for general adult medical examination without abnormal findings: Secondary | ICD-10-CM | POA: Diagnosis not present

## 2023-12-31 LAB — POCT URINALYSIS DIP (CLINITEK)
Glucose, UA: NEGATIVE mg/dL
Leukocytes, UA: NEGATIVE
Nitrite, UA: NEGATIVE
POC PROTEIN,UA: >=300 — AB
Spec Grav, UA: >=1.03 — AB (ref 1.010–1.025)
Urobilinogen, UA: 1 U/dL
pH, UA: 5.5 (ref 5.0–8.0)

## 2023-12-31 MED ORDER — EMPAGLIFLOZIN 10 MG PO TABS
10.0000 mg | ORAL_TABLET | Freq: Every day | ORAL | 1 refills | Status: AC
Start: 2023-12-31 — End: ?
  Filled 2023-12-31: qty 90, 90d supply, fill #0
  Filled 2024-03-26: qty 90, 90d supply, fill #1
  Filled 2024-03-27: qty 90, 90d supply, fill #0

## 2023-12-31 NOTE — Patient Instructions (Signed)
 Health Maintenance  Topic Date Due   Eye exam for diabetics  05/28/2021   Breast Cancer Screening  12/15/2022   COVID-19 Vaccine (8 - Moderna risk 2024-25 season) 01/16/2024*   Cologuard (Stool DNA test)  03/23/2024   Complete foot exam   04/16/2024   Hemoglobin A1C  06/26/2024   Yearly kidney health urinalysis for diabetes  07/04/2024   Yearly kidney function blood test for diabetes  12/27/2024   DTaP/Tdap/Td vaccine (2 - Td or Tdap) 05/13/2025   Pneumococcal Vaccine for age over 69  Completed   Flu Shot  Completed   DEXA scan (bone density measurement)  Completed   Hepatitis C Screening  Completed   Zoster (Shingles) Vaccine  Completed   HPV Vaccine  Aged Out   Meningitis B Vaccine  Aged Out   Colon Cancer Screening  Discontinued  *Topic was postponed. The date shown is not the original due date.

## 2023-12-31 NOTE — Progress Notes (Signed)
 [name], CMA,acting as a Neurosurgeon for Jaime Ada, FNP.,have documented all relevant documentation on the behalf of Jaime Ada, FNP,as directed by  Jaime Ada, FNP while in the presence of Jaime Ada, FNP.  Subjective:    Patient ID: Jaime Rodriguez , female    DOB: 1950/10/28 , 73 y.o.   MRN: [id number]  Chief Complaint  Patient presents with   Annual Exam    Patient presents today for HM, Patient reports compliance with medication. Patient denies any chest pain, SOB, or headaches. Patient has no concerns today.     Discussed the use of AI scribe software for clinical note transcription with the patient, who gave verbal consent to proceed.  History of Present Illness Jaime Rodriguez is a 73 year old female who presents for an annual physical exam and follow-up on lab results.  She has experienced a weight decrease of about three pounds since August, despite consuming sweets. Her A1c has increased to 6.5%. She is currently taking metformin  1000 mg twice a day without gastrointestinal issues.  She has no urinary symptoms such as frequency or burning. She acknowledges not drinking enough water and finds it challenging to do so during her full-time work, which she believes has affected her kidney function.  She has not been exercising as much as she used to due to fatigue after work. She works full-time, five days a week.  She is taking rosuvastatin  for cholesterol, which has not improved significantly. She has been cutting back on fried and fatty foods. She uses Metamucil occasionally to help with cholesterol and reports no constipation.  She has a small hernia that occasionally bulges but is not painful or hard.  She has been using hearing aids for about a year and finds them beneficial. She continues to receive Botox  injections for twitching, which affects her speech, but notes some improvement. She has no trouble swallowing and regularly visits the dentist.  She has seen her  Dentist. She has her GYN appt on Wednesday - with Madolyn Monte PA.    Past Medical History:  Diagnosis Date   Allergy    Arthritis Unknown   Diabetes mellitus    Fibrocystic disease of breast    LEFT BREAST SURGERY   Hemifacial spasm 12/18/2012   Left lower face   Hyperlipidemia    Osteoporosis Unknown   Postmenopausal      Family History  Problem Relation Age of Onset   Cancer Mother    Other Mother        sepsis   Stroke Father    Prostate cancer Brother    Cancer Maternal Grandmother    Asthma Sister    Hyperlipidemia Sister    Diabetes Sister    COPD Sister    Heart disease Sister    Hypertension Sister    Cancer Brother    Hypertension Sister    Anxiety disorder Other    Diabetes Other    Hyperlipidemia Other    Cancer Brother      Current Outpatient Medications:    Blood Glucose Monitoring Suppl (FREESTYLE LITE) w/Device KIT, Use 2 times a day to test blood sugar before breakfast and dinner, Disp: 1 kit, Rfl: 1   botulinum toxin Type A  (BOTOX ) 100 units SOLR injection, Inject 100 Units into the muscle every 3 (three) months., Disp: , Rfl:    diclofenac  sodium (VOLTAREN ) 1 % GEL, Apply 2 g topically 4 (four) times daily as needed., Disp: 1 Tube, Rfl: 3  empagliflozin  (JARDIANCE ) 10 MG TABS tablet, Take 1 tablet (10 mg total) by mouth daily before breakfast., Disp: 90 tablet, Rfl: 1   glucose blood (FREESTYLE LITE) test strip, USE TWICE A DAY BEFORE BREAKFAST & DINNER AS DIRECTED, Disp: 100 strip, Rfl: 12   Lancets (FREESTYLE) lancets, USE TWICE A DAY BEFORE BREAKFAST & DINNER AS DIRECTED, Disp: 100 each, Rfl: 12   loratadine  (CLARITIN ) 10 MG tablet, Take 1 tablet (10 mg total) by mouth daily., Disp: 30 tablet, Rfl: 1   meclizine  (ANTIVERT ) 12.5 MG tablet, Take 1 tablet (12.5 mg total) by mouth 3 (three) times daily as needed for dizziness., Disp: 30 tablet, Rfl: 2   metFORMIN  (GLUCOPHAGE ) 500 MG tablet, Take 1.5 tablets (750 mg total) by mouth 2 (two) times  daily with a meal., Disp: 270 tablet, Rfl: 1   rosuvastatin  (CRESTOR ) 20 MG tablet, Take 1 tablet (20 mg total) by mouth daily., Disp: 90 tablet, Rfl: 1   SODIUM FLUORIDE , DENTAL RINSE, (PREVIDENT ) 0.2 % SOLN, USE AS AN ORAL RINSE, AS DIRECTED ON PACKAGE, Disp: 473 mL, Rfl: 5   SODIUM FLUORIDE , DENTAL RINSE, (PREVIDENT ) 0.2 % SOLN, USE AS AN ORAL RINSE, AS DIRECTED ON PACKAGE, Disp: 473 mL, Rfl: 5   valACYclovir  (VALTREX ) 500 MG tablet, Take 1 tablet (500 mg total) by mouth as directed., Disp: 90 tablet, Rfl: 1   VITAMIN E PO, Take 1 tablet by mouth daily at 6 (six) AM., Disp: , Rfl:    Allergies  Allergen Reactions   Penicillins       The patient states she uses post menopausal status. No LMP recorded. Patient is postmenopausal.    Negative for: breast discharge, breast lump(s), breast pain and breast self exam. Associated symptoms include abnormal vaginal bleeding. Pertinent negatives include abnormal bleeding (hematology), anxiety, decreased libido, depression, difficulty falling sleep, dyspareunia, history of infertility, nocturia, sexual dysfunction, sleep disturbances, urinary incontinence, urinary urgency, vaginal discharge and vaginal itching. Diet regular; she will eat some sweets. The patient states her exercise level is minimal - she is exercising at work.   The patient's tobacco use is:  Social History   Tobacco Use  Smoking Status Never  Smokeless Tobacco Never   She has been exposed to passive smoke. The patient's alcohol use is:  Social History   Substance and Sexual Activity  Alcohol Use Never    Review of Systems  Constitutional: Negative.   HENT: Negative.    Eyes: Negative.   Respiratory: Negative.    Cardiovascular: Negative.   Gastrointestinal: Negative.   Endocrine: Negative.   Genitourinary: Negative.   Musculoskeletal: Negative.   Skin: Negative.   Allergic/Immunologic: Negative.   Neurological: Negative.   Hematological: Negative.    Psychiatric/Behavioral: Negative.       Today's Vitals   12/31/23 1410  BP: 120/64  Pulse: 99  Temp: 98.7 F (37.1 C)  TempSrc: Oral  Weight: 100 lb 3.2 oz (45.5 kg)  Height: 5' (1.524 m)  PainSc: 0-No pain   Body mass index is 19.57 kg/m.  Wt Readings from Last 3 Encounters:  12/31/23 100 lb 3.2 oz (45.5 kg)  11/14/23 103 lb (46.7 kg)  10/04/23 102 lb (46.3 kg)     Objective:  Physical Exam Vitals and nursing note reviewed.  Constitutional:      General: She is not in acute distress.    Appearance: Normal appearance. She is well-developed. She is obese.  HENT:     Head: Normocephalic and atraumatic.     Comments:  Left side of face is drooped and asymmetrical    Right Ear: Hearing, tympanic membrane, ear canal and external ear normal. There is no impacted cerumen.     Left Ear: Hearing, tympanic membrane, ear canal and external ear normal. There is no impacted cerumen.     Nose: Nose normal.     Mouth/Throat:     Mouth: Mucous membranes are moist.  Eyes:     General: Lids are normal.     Extraocular Movements: Extraocular movements intact.     Conjunctiva/sclera: Conjunctivae normal.     Pupils: Pupils are equal, round, and reactive to light.     Funduscopic exam:    Right eye: No papilledema.        Left eye: No papilledema.  Neck:     Thyroid : No thyroid  mass.     Vascular: No carotid bruit.  Cardiovascular:     Rate and Rhythm: Normal rate and regular rhythm.     Pulses: Normal pulses.     Heart sounds: Normal heart sounds. No murmur heard. Pulmonary:     Effort: Pulmonary effort is normal. No respiratory distress.     Breath sounds: Normal breath sounds. No wheezing.  Abdominal:     General: Abdomen is flat. Bowel sounds are normal. There is no distension.     Palpations: Abdomen is soft.     Tenderness: There is no abdominal tenderness.  Musculoskeletal:        General: No swelling or tenderness. Normal range of motion.     Cervical back: Full  passive range of motion without pain, normal range of motion and neck supple.     Right lower leg: No edema.     Left lower leg: No edema.  Skin:    General: Skin is warm and dry.     Capillary Refill: Capillary refill takes less than 2 seconds.  Neurological:     General: No focal deficit present.     Mental Status: She is alert and oriented to person, place, and time.     Cranial Nerves: No cranial nerve deficit.     Sensory: No sensory deficit.  Psychiatric:        Mood and Affect: Mood normal.        Behavior: Behavior normal.        Thought Content: Thought content normal.        Judgment: Judgment normal.       Assessment And Plan:     Encounter for annual health examination Assessment & Plan: No abnormal vaginal bleeding postmenopausal. Weight down by three pounds since August. EKG shows no change. Regular dental visits. Hearing aids beneficial. - Encourage regular breast self-exams and mammograms. - Continue regular dental visits. - Encourage increased physical activity.   Type 2 diabetes mellitus with hyperglycemia, without long-term current use of insulin (HCC) Assessment & Plan: A1c increased to 6.8. Discussed potential switch to Jardiance  for cardiovascular benefits and glucose control. Jardiance  may cause yeast infections and hypotension. - Check availability of Jardiance  samples. - Continue metformin  750 mg twice daily until Jardiance  is available. - Monitor blood pressure regularly. - Hold Jardiance  if experiencing vomiting or diarrhea.  EGFR decreased from 71 to 54, indicating potential stage 3 chronic kidney disease. Suspected dehydration due to low water intake. - Encourage increased water intake. - Recheck kidney function in follow-up.  Orders: -     EKG 12-Lead -     POCT URINALYSIS DIP (CLINITEK) -     Microalbumin /  creatinine urine ratio -     Empagliflozin ; Take 1 tablet (10 mg total) by mouth daily before breakfast.  Dispense: 90 tablet; Refill:  1  Mixed hyperlipidemia Assessment & Plan: Cholesterol levels slightly improved but still elevated. ASCVD risk score is 31.5, indicating high risk for cardiovascular events. - Order lipoprotein A test. - Consider referral to cardiologist if lipoprotein A is positive. - Continue rosuvastatin  20 mg daily.   Hematuria, unspecified type -     Urine Culture  Aortic atherosclerosis Assessment & Plan: Continue statin.  Orders: -     Empagliflozin ; Take 1 tablet (10 mg total) by mouth daily before breakfast.  Dispense: 90 tablet; Refill: 1  Type 2 diabetes mellitus with stage 3a chronic kidney disease, without long-term current use of insulin (HCC) Assessment & Plan: A1c increased to 6.8. Discussed potential switch to Jardiance  for cardiovascular benefits and glucose control. Jardiance  may cause yeast infections and hypotension. - Check availability of Jardiance  samples. - Continue metformin  750 mg twice daily until Jardiance  is available. - Monitor blood pressure regularly. - Hold Jardiance  if experiencing vomiting or diarrhea.  EGFR decreased from 71 to 54, indicating potential stage 3 chronic kidney disease. Suspected dehydration due to low water intake. - Encourage increased water intake. - Recheck kidney function in follow-up.   Other specified abdominal hernia without obstruction or gangrene Assessment & Plan: Occasional bulging in the abdomen, suspected small hernia. Not painful or hard, no immediate intervention required unless it enlarges or becomes painful. - Monitor hernia for changes in size or pain.   Asymptomatic microscopic hematuria Assessment & Plan: Moderate blood in urine with no urinary symptoms. Protein levels elevated, possibly due to dehydration. - Order urine culture. - Encourage increased water intake.    Return for 1 year physical, controlled DM check 4 months; 6 weeks medication f/u may do virtual.   Patient was given opportunity to ask questions.  Patient verbalized understanding of the plan and was able to repeat key elements of the plan. All questions were answered to their satisfaction.   Jaime Ada, FNP  I, Jaime Ada, FNP, have reviewed all documentation for this visit. The documentation on 12/31/23 for the exam, diagnosis, procedures, and orders are all accurate and complete.

## 2024-01-01 ENCOUNTER — Other Ambulatory Visit (HOSPITAL_COMMUNITY): Payer: Self-pay

## 2024-01-01 ENCOUNTER — Ambulatory Visit: Payer: Self-pay | Admitting: Nurse Practitioner

## 2024-01-01 LAB — MICROALBUMIN / CREATININE URINE RATIO
Creatinine, Urine: 202.2 mg/dL
Microalb/Creat Ratio: 155 mg/g{creat} — ABNORMAL HIGH (ref 0–29)
Microalbumin, Urine: 312.9 ug/mL

## 2024-01-02 ENCOUNTER — Ambulatory Visit
Admission: RE | Admit: 2024-01-02 | Discharge: 2024-01-02 | Disposition: A | Discharge status: Home / Self Care | Source: Ambulatory Visit | Attending: Obstetrics and Gynecology | Admitting: Obstetrics and Gynecology

## 2024-01-02 DIAGNOSIS — Z124 Encounter for screening for malignant neoplasm of cervix: Secondary | ICD-10-CM | POA: Diagnosis not present

## 2024-01-02 DIAGNOSIS — Z1211 Encounter for screening for malignant neoplasm of colon: Secondary | ICD-10-CM | POA: Diagnosis not present

## 2024-01-02 DIAGNOSIS — Z1330 Encounter for screening examination for mental health and behavioral disorders, unspecified: Secondary | ICD-10-CM | POA: Diagnosis not present

## 2024-01-02 DIAGNOSIS — Z1231 Encounter for screening mammogram for malignant neoplasm of breast: Secondary | ICD-10-CM

## 2024-01-02 DIAGNOSIS — M810 Age-related osteoporosis without current pathological fracture: Secondary | ICD-10-CM | POA: Diagnosis not present

## 2024-01-02 DIAGNOSIS — Z01419 Encounter for gynecological examination (general) (routine) without abnormal findings: Secondary | ICD-10-CM | POA: Diagnosis not present

## 2024-01-02 LAB — URINE CULTURE

## 2024-01-06 DIAGNOSIS — R3121 Asymptomatic microscopic hematuria: Secondary | ICD-10-CM | POA: Insufficient documentation

## 2024-01-06 DIAGNOSIS — K458 Other specified abdominal hernia without obstruction or gangrene: Secondary | ICD-10-CM | POA: Insufficient documentation

## 2024-01-06 NOTE — Assessment & Plan Note (Signed)
 Occasional bulging in the abdomen, suspected small hernia. Not painful or hard, no immediate intervention required unless it enlarges or becomes painful. - Monitor hernia for changes in size or pain.

## 2024-01-06 NOTE — Assessment & Plan Note (Signed)
 No abnormal vaginal bleeding postmenopausal. Weight down by three pounds since August. EKG shows no change. Regular dental visits. Hearing aids beneficial. - Encourage regular breast self-exams and mammograms. - Continue regular dental visits. - Encourage increased physical activity.

## 2024-01-06 NOTE — Assessment & Plan Note (Signed)
 Cholesterol levels slightly improved but still elevated. ASCVD risk score is 31.5, indicating high risk for cardiovascular events. - Order lipoprotein A test. - Consider referral to cardiologist if lipoprotein A is positive. - Continue rosuvastatin  20 mg daily.

## 2024-01-06 NOTE — Assessment & Plan Note (Signed)
 Moderate blood in urine with no urinary symptoms. Protein levels elevated, possibly due to dehydration. - Order urine culture. - Encourage increased water intake.

## 2024-01-06 NOTE — Assessment & Plan Note (Addendum)
 A1c increased to 6.8. Discussed potential switch to Jardiance  for cardiovascular benefits and glucose control. Jardiance  may cause yeast infections and hypotension. - Check availability of Jardiance  samples. - Continue metformin  750 mg twice daily until Jardiance  is available. - Monitor blood pressure regularly. - Hold Jardiance  if experiencing vomiting or diarrhea.  EGFR decreased from 71 to 54, indicating potential stage 3 chronic kidney disease. Suspected dehydration due to low water intake. - Encourage increased water intake. - Recheck kidney function in follow-up.

## 2024-01-06 NOTE — Assessment & Plan Note (Addendum)
 Continue statin

## 2024-01-08 ENCOUNTER — Other Ambulatory Visit: Payer: Self-pay

## 2024-01-08 DIAGNOSIS — E782 Mixed hyperlipidemia: Secondary | ICD-10-CM

## 2024-01-09 ENCOUNTER — Other Ambulatory Visit: Payer: Self-pay | Admitting: Nurse Practitioner

## 2024-01-09 ENCOUNTER — Other Ambulatory Visit (HOSPITAL_COMMUNITY): Payer: Self-pay

## 2024-01-09 LAB — HEPATIC FUNCTION PANEL
ALT: 15 IU/L (ref 0–32)
AST: 23 IU/L (ref 0–40)
Albumin: 4.4 g/dL (ref 3.8–4.8)
Alkaline Phosphatase: 81 IU/L (ref 49–135)
Bilirubin Total: 0.2 mg/dL (ref 0.0–1.2)
Bilirubin, Direct: 0.09 mg/dL (ref 0.00–0.40)
Total Protein: 7.1 g/dL (ref 6.0–8.5)

## 2024-01-10 ENCOUNTER — Other Ambulatory Visit: Payer: Self-pay

## 2024-01-10 ENCOUNTER — Other Ambulatory Visit (HOSPITAL_COMMUNITY): Payer: Self-pay

## 2024-01-10 MED ORDER — LORATADINE 10 MG PO TABS
10.0000 mg | ORAL_TABLET | Freq: Every day | ORAL | 1 refills | Status: DC
  Filled 2024-01-10: qty 30, 30d supply, fill #0
  Filled 2024-02-04: qty 30, 30d supply, fill #1

## 2024-02-04 ENCOUNTER — Other Ambulatory Visit (HOSPITAL_COMMUNITY): Payer: Self-pay

## 2024-02-04 MED ORDER — COVID-19 MRNA VAC-TRIS(PFIZER) 30 MCG/0.3ML IM SUSY
0.3000 mL | PREFILLED_SYRINGE | Freq: Once | INTRAMUSCULAR | 0 refills | Status: AC
  Filled 2024-02-04: qty 0.3, 1d supply, fill #0

## 2024-02-06 ENCOUNTER — Other Ambulatory Visit (HOSPITAL_COMMUNITY): Payer: Self-pay

## 2024-02-11 ENCOUNTER — Encounter: Payer: Self-pay | Admitting: Nurse Practitioner

## 2024-02-13 ENCOUNTER — Encounter: Payer: Self-pay | Admitting: Nurse Practitioner

## 2024-02-13 ENCOUNTER — Telehealth (INDEPENDENT_AMBULATORY_CARE_PROVIDER_SITE_OTHER): Payer: Self-pay | Admitting: Nurse Practitioner

## 2024-02-13 VITALS — BP 118/71 | HR 96

## 2024-02-13 DIAGNOSIS — E1165 Type 2 diabetes mellitus with hyperglycemia: Secondary | ICD-10-CM | POA: Diagnosis not present

## 2024-02-13 NOTE — Progress Notes (Signed)
 Virtual Visit via Video Note  [name], CMA,acting as a scribe for Jaime Ada, FNP.,have documented all relevant documentation on the behalf of Jaime Ada, FNP,as directed by  Jaime Ada, FNP while in the presence of Jaime Ada, FNP.  I connected with Jaime Rodriguez on 02/20/24 at 12:00 PM EST by a video enabled telemedicine application and verified that I am speaking with the correct person using two identifiers.  Patient Location: Other:  Work Dispensing Optician: Office/Clinic  I discussed the limitations, risks, security, and privacy concerns of performing an evaluation and management service by video and the availability of in person appointments. I also discussed with the patient that there may be a patient responsible charge related to this service. The patient expressed understanding and agreed to proceed.  Subjective: PCP: Rodriguez Gaines, FNP  Chief Complaint  Patient presents with   Diabetes    Patient presents today for a  dm follow up, Patient reports compliance with medication. Patient denies any chest pain, SOB, or headaches. Patient reports she hasn't had any issues with jardiance .    She is not having any issues with the Jardiance . Her blood sugar has been good around 81. Denies any symptoms related to vaginal itching/vaginal discharge or dysuria.      ROS: Per HPI  Current Outpatient Medications:    Blood Glucose Monitoring Suppl (FREESTYLE LITE) w/Device KIT, Use 2 times a day to test blood sugar before breakfast and dinner, Disp: 1 kit, Rfl: 1   botulinum toxin Type A  (BOTOX ) 100 units SOLR injection, Inject 100 Units into the muscle every 3 (three) months., Disp: , Rfl:    diclofenac  sodium (VOLTAREN ) 1 % GEL, Apply 2 g topically 4 (four) times daily as needed., Disp: 1 Tube, Rfl: 3   empagliflozin  (JARDIANCE ) 10 MG TABS tablet, Take 1 tablet (10 mg total) by mouth daily before breakfast., Disp: 90 tablet, Rfl: 1   glucose blood (FREESTYLE LITE) test  strip, USE TWICE A DAY BEFORE BREAKFAST & DINNER AS DIRECTED, Disp: 100 strip, Rfl: 12   Lancets (FREESTYLE) lancets, USE TWICE A DAY BEFORE BREAKFAST & DINNER AS DIRECTED, Disp: 100 each, Rfl: 12   loratadine  (CLARITIN ) 10 MG tablet, Take 1 tablet (10 mg total) by mouth daily., Disp: 30 tablet, Rfl: 1   meclizine  (ANTIVERT ) 12.5 MG tablet, Take 1 tablet (12.5 mg total) by mouth 3 (three) times daily as needed for dizziness., Disp: 30 tablet, Rfl: 2   metFORMIN  (GLUCOPHAGE ) 500 MG tablet, Take 1.5 tablets (750 mg total) by mouth 2 (two) times daily with a meal., Disp: 270 tablet, Rfl: 1   rosuvastatin  (CRESTOR ) 20 MG tablet, Take 1 tablet (20 mg total) by mouth daily., Disp: 90 tablet, Rfl: 1   SODIUM FLUORIDE , DENTAL RINSE, (PREVIDENT ) 0.2 % SOLN, USE AS AN ORAL RINSE, AS DIRECTED ON PACKAGE, Disp: 473 mL, Rfl: 5   SODIUM FLUORIDE , DENTAL RINSE, (PREVIDENT ) 0.2 % SOLN, USE AS AN ORAL RINSE, AS DIRECTED ON PACKAGE, Disp: 473 mL, Rfl: 5   valACYclovir  (VALTREX ) 500 MG tablet, Take 1 tablet (500 mg total) by mouth as directed., Disp: 90 tablet, Rfl: 1   VITAMIN E PO, Take 1 tablet by mouth daily at 6 (six) AM., Disp: , Rfl:   Current Facility-Administered Medications:    botulinum toxin Type A  (BOTOX ) injection 100 Units, 100 Units, Intramuscular, Once, Onita Duos, MD  Observations/Objective: Today's Vitals   02/14/24 0746  BP: 118/71  Pulse: 96   Physical Exam Vitals  and nursing note reviewed.  Constitutional:      General: She is not in acute distress.    Appearance: Normal appearance. She is normal weight.  Pulmonary:     Effort: Pulmonary effort is normal. No respiratory distress.  Neurological:     Mental Status: She is alert.    Assessment and Plan: Type 2 diabetes mellitus with hyperglycemia, without long-term current use of insulin (HCC) Assessment & Plan: She is tolerating Jardiance  well. Will recheck BMP. Encouraged to stay well hydrated with water.   Orders: -      BMP8+eGFR; Future    Follow Up Instructions: Return for keep same next.   I discussed the assessment and treatment plan with the patient. The patient was provided an opportunity to ask questions, and all were answered. The patient agreed with the plan and demonstrated an understanding of the instructions.   The patient was advised to call back or seek an in-person evaluation if the symptoms worsen or if the condition fails to improve as anticipated.  The above assessment and management plan was discussed with the patient. The patient verbalized understanding of and has agreed to the management plan.   LILLETTE Jaime Ada, FNP, have reviewed all documentation for this visit. The documentation on 02/13/24 for the exam, diagnosis, procedures, and orders are all accurate and complete.

## 2024-02-14 ENCOUNTER — Ambulatory Visit: Admitting: Neurology

## 2024-02-14 VITALS — BP 131/79

## 2024-02-14 DIAGNOSIS — G5132 Clonic hemifacial spasm, left: Secondary | ICD-10-CM | POA: Diagnosis not present

## 2024-02-14 MED ORDER — ONABOTULINUMTOXINA 100 UNITS IJ SOLR: 100.0000 [IU] | Freq: Once | INTRAMUSCULAR | Status: AC

## 2024-02-14 NOTE — Progress Notes (Signed)
    PATIENT: Jaime Rodriguez DOB: [birth year]  Chief Complaint  Patient presents with   Injections    Rm14, alone, Pt is well and ready for injection     HISTORICAL   Jaime Rodriguez is a 73 y.o.  right-handed black female referred by Dr. Jenel for EMG guided botulinumtoxin injection for left facial spasm   She had a history of left-sided hemifacial spasm that began approximately a year ago in 2013. The patient was seen by Dr. Lindy, and she underwent MRI evaluation of the brain with and without gadolinium that was unremarkable. The patient underwent Botox  injections in 2013,   She did well with the Botox , and the benefit lasted almost one year. She did has mild left facial droop after the injection.   Over the past few months, she began to experience recurrent left facial spasm again, most bothersome symptoms is around her eyes, she works as a agricultural engineer, is causing social embarrassment, sometimes even block her left vision.    She denies any balance issues, speech problems or swallowing problems. The patient denies any change in vision. She denies a previous history of left Bell's palsy.   In recent 1 year, since 2014, She began to have urinary urgency, she never had children in the past she denies bilateral upper and lower extremity paresthesia    She is receiving every 3 months EMG guided Botox  injection for left hemifacial spasm  On exam: She has frequent left hemifacial muscle spasm, involving left orbicularis oculi, orbicularis oris, mild left cheek puff weakness. She has mild  left eye closure weakness.,  Asymmetry, low-lying left nasolabial fold   Assessment/Plan:   Left hemifacial spasm,      EMG guided Botox  a injection  100 units, dissolved into 2 cc of normal saline,   2.5 units at each injection sites  (2.5x 20) =50 units, discard 50 units     Jerrell Hart, M.D. Ph.D.    [facility] [address] [place], [place] [address] Phone:  [phone] Fax:      [phone]

## 2024-02-14 NOTE — Progress Notes (Addendum)
 Botox - 100 units x 1 vial Lot: [id number] Expiration: 2027/10 NDC: 9976-8854-98  Bacteriostatic 0.9% Sodium Chloride- 2 mL  Lot: [id number] Expiration: 02/07/25 NDC: 9590803397  Dx: H48.67  b/B Witnessed by [facility] CMA

## 2024-02-19 DIAGNOSIS — K5900 Constipation, unspecified: Secondary | ICD-10-CM | POA: Diagnosis not present

## 2024-02-19 DIAGNOSIS — R634 Abnormal weight loss: Secondary | ICD-10-CM | POA: Diagnosis not present

## 2024-02-19 DIAGNOSIS — Z1211 Encounter for screening for malignant neoplasm of colon: Secondary | ICD-10-CM | POA: Diagnosis not present

## 2024-02-20 DIAGNOSIS — E1165 Type 2 diabetes mellitus with hyperglycemia: Secondary | ICD-10-CM | POA: Insufficient documentation

## 2024-02-20 NOTE — Assessment & Plan Note (Addendum)
 She is tolerating Jardiance  well. Will recheck BMP. Encouraged to stay well hydrated with water.

## 2024-02-25 ENCOUNTER — Other Ambulatory Visit (HOSPITAL_COMMUNITY): Payer: Self-pay

## 2024-02-25 ENCOUNTER — Other Ambulatory Visit: Payer: Self-pay

## 2024-02-25 ENCOUNTER — Encounter: Payer: Self-pay | Admitting: Internal Medicine

## 2024-02-25 ENCOUNTER — Ambulatory Visit: Attending: Internal Medicine | Admitting: Internal Medicine

## 2024-02-25 VITALS — BP 118/66 | HR 94 | Ht 60.0 in | Wt 99.4 lb

## 2024-02-25 DIAGNOSIS — E782 Mixed hyperlipidemia: Secondary | ICD-10-CM | POA: Diagnosis not present

## 2024-02-25 MED ORDER — EZETIMIBE 10 MG PO TABS
10.0000 mg | ORAL_TABLET | Freq: Every day | ORAL | 3 refills | Status: AC
  Filled 2024-02-25 – 2024-02-26 (×2): qty 90, 90d supply, fill #0

## 2024-02-25 NOTE — Patient Instructions (Signed)
 Medication Instructions:  Your physician has recommended you make the following change in your medication:   Start Zetia 10 mg at night   *If you need a refill on your cardiac medications before your next appointment, please call your pharmacy*  Lab Work: Your physician recommends that you return for lab work in: 2 Months   If you have labs (blood work) drawn today and your tests are completely normal, you will receive your results only by: MyChart Message (if you have MyChart) OR A paper copy in the mail If you have any lab test that is abnormal or we need to change your treatment, we will call you to review the results.  Testing/Procedures: NONE    Follow-Up: At [facility], you and your health needs are our priority.  As part of our continuing mission to provide you with exceptional heart care, our providers are all part of one team.  This team includes your primary Cardiologist (physician) and Advanced Practice Providers or APPs (Physician Assistants and Nurse Practitioners) who all work together to provide you with the care you need, when you need it.  Your next appointment:   1 year(s)  Provider:   You may see Vina Gull, MD or one of the following Advanced Practice Providers on your designated Care Team:   Laymon Qua, PA-C  [place], [place] Olivia Pavy, [place]     We recommend signing up for the patient portal called MyChart.  Sign up information is provided on this After Visit Summary.  MyChart is used to connect with patients for Virtual Visits (Telemedicine).  Patients are able to view lab/test results, encounter notes, upcoming appointments, etc.  Non-urgent messages can be sent to your provider as well.   To learn more about what you can do with MyChart, go to [email or web address].   Other Instructions Thank you for choosing  [facility]!

## 2024-02-25 NOTE — Progress Notes (Signed)
 Cardiology Office Note   Date:  02/25/2024   ID:  Jaime Rodriguez, DOB 04/26/1950, MRN [id number]  PCP:  Georgina Speaks, FNP  Cardiologist:   Vina Gull, MD   PT presents for follow up of CAD     History of Present Illness: Jaime Rodriguez is a 73 y.o. female with a history of CAD, aortic atherosclerosis, DM, HL   Seen for the first time last Dec 2024  Oct 2024   CT Calcium  score 31      PT feels good  No CP   Breathing good  Feels good   Pt started on Jardiance  a few months ago   Has not had A1C checked   Diet Br:   Blue berries, nuts and oatmeal  Water Lunch:  Varies  Doctor, Hospital  Had turkey type dinner yesterday   Eats out   Trying to cut back     Current Meds  Medication Sig   Blood Glucose Monitoring Suppl (FREESTYLE LITE) w/Device KIT Use 2 times a day to test blood sugar before breakfast and dinner   botulinum toxin Type A  (BOTOX ) 100 units SOLR injection Inject 100 Units into the muscle every 3 (three) months.   diclofenac  sodium (VOLTAREN ) 1 % GEL Apply 2 g topically 4 (four) times daily as needed.   empagliflozin  (JARDIANCE ) 10 MG TABS tablet Take 1 tablet (10 mg total) by mouth daily before breakfast.   glucose blood (FREESTYLE LITE) test strip USE TWICE A DAY BEFORE BREAKFAST & DINNER AS DIRECTED   Lancets (FREESTYLE) lancets USE TWICE A DAY BEFORE BREAKFAST & DINNER AS DIRECTED   loratadine  (CLARITIN ) 10 MG tablet Take 1 tablet (10 mg total) by mouth daily.   meclizine  (ANTIVERT ) 12.5 MG tablet Take 1 tablet (12.5 mg total) by mouth 3 (three) times daily as needed for dizziness.   rosuvastatin  (CRESTOR ) 20 MG tablet Take 1 tablet (20 mg total) by mouth daily.   SODIUM FLUORIDE , DENTAL RINSE, (PREVIDENT ) 0.2 % SOLN USE AS AN ORAL RINSE, AS DIRECTED ON PACKAGE   SODIUM FLUORIDE , DENTAL RINSE, (PREVIDENT ) 0.2 % SOLN USE AS AN ORAL RINSE, AS DIRECTED ON PACKAGE   valACYclovir  (VALTREX ) 500 MG tablet Take 1 tablet (500 mg total) by mouth as directed.    Current Facility-Administered Medications for the 02/25/24 encounter (Office Visit) with Gull Vina GAILS, MD  Medication   botulinum toxin Type A  (BOTOX ) injection 100 Units     Allergies:   Penicillins   Past Medical History:  Diagnosis Date   Allergy    Arthritis Unknown   Diabetes mellitus    Fibrocystic disease of breast    LEFT BREAST SURGERY   Hemifacial spasm 12/18/2012   Left lower face   Hyperlipidemia    Osteoporosis Unknown   Postmenopausal     Past Surgical History:  Procedure Laterality Date   BREAST LUMPECTOMY     Benign lesion     Social History:  The patient  reports that she has never smoked. She has never used smokeless tobacco. She reports that she does not drink alcohol and does not use drugs.   Family History:  The patient's family history includes Anxiety disorder in an other family member; Asthma in her sister; COPD in her sister; Cancer in her brother, brother, maternal grandmother, and mother; Diabetes in her sister and another family member; Heart disease in her sister; Hyperlipidemia in her sister and another family member; Hypertension in her sister and sister; Other  in her mother; Prostate cancer in her brother; Stroke in her father.    ROS:  Please see the history of present illness. All other systems are reviewed and  Negative to the above problem except as noted.    PHYSICAL EXAM: VS:  BP 118/66 (BP Location: Left Arm, Cuff Size: Normal)   Pulse 94   Ht 5' (1.524 m)   Wt 99 lb 6.4 oz (45.1 kg)   SpO2 100%   BMI 19.41 kg/m   GEN: Petite 73 yo in no acute distress  HEENT: normal  Neck: no JVD, carotid bruit Cardiac: RRR; no murmur Respiratory:  clear to auscultation  GI: soft, nontender, No hepatomegaly  Ext  No LE edema   EKG:  EKG is ordered today.  NSR 94 bpm  RAA   Lipid Panel    Component Value Date/Time   CHOL 209 (H) 12/28/2023 1027   TRIG 54 12/28/2023 1027   HDL 77 12/28/2023 1027   CHOLHDL 2.7 12/28/2023 1027    LDLCALC 122 (H) 12/28/2023 1027      Wt Readings from Last 3 Encounters:  02/25/24 99 lb 6.4 oz (45.1 kg)  12/31/23 100 lb 3.2 oz (45.5 kg)  11/14/23 103 lb (46.7 kg)      ASSESSMENT AND PLAN: 1  CAD   CT calcium  score identified   Score 37   Pt without symptoms  Rx risk factors   2 Lpids   LDL is not optimal   Would add Zetia 10 mg   Keep on rosuvastatin    Follow up NMR panel and liver panel and Lpa in 8 wks   3  DM   A1c 6.5   Jardiance  recently added    Follow up in 1 year     Current medicines are reviewed at length with the patient today.  The patient does not have concerns regarding medicines.  Signed, Vina Gull, MD

## 2024-02-26 ENCOUNTER — Encounter: Payer: Self-pay | Admitting: Nurse Practitioner

## 2024-02-26 ENCOUNTER — Other Ambulatory Visit: Payer: Self-pay

## 2024-02-26 ENCOUNTER — Other Ambulatory Visit: Payer: Self-pay | Admitting: Nurse Practitioner

## 2024-02-26 DIAGNOSIS — E1165 Type 2 diabetes mellitus with hyperglycemia: Secondary | ICD-10-CM | POA: Diagnosis not present

## 2024-02-27 LAB — BMP8+EGFR
BUN/Creatinine Ratio: 17 (ref 12–28)
BUN: 15 mg/dL (ref 8–27)
CO2: 21 mmol/L (ref 20–29)
Calcium: 9.1 mg/dL (ref 8.7–10.3)
Chloride: 102 mmol/L (ref 96–106)
Creatinine, Ser: 0.9 mg/dL (ref 0.57–1.00)
Glucose: 94 mg/dL (ref 70–99)
Potassium: 4 mmol/L (ref 3.5–5.2)
Sodium: 140 mmol/L (ref 134–144)
eGFR: 68 mL/min/1.73 (ref >59)

## 2024-03-10 ENCOUNTER — Other Ambulatory Visit: Payer: Self-pay | Admitting: Nurse Practitioner

## 2024-03-10 ENCOUNTER — Other Ambulatory Visit (HOSPITAL_COMMUNITY): Payer: Self-pay

## 2024-03-10 MED ORDER — LORATADINE 10 MG PO TABS
10.0000 mg | ORAL_TABLET | Freq: Every day | ORAL | 1 refills | Status: DC
  Filled 2024-03-10: qty 30, 30d supply, fill #0
  Filled 2024-04-04: qty 30, 30d supply, fill #1

## 2024-03-14 ENCOUNTER — Other Ambulatory Visit (HOSPITAL_COMMUNITY): Payer: Self-pay

## 2024-03-26 ENCOUNTER — Other Ambulatory Visit: Payer: Self-pay | Admitting: Nurse Practitioner

## 2024-03-26 ENCOUNTER — Other Ambulatory Visit (HOSPITAL_COMMUNITY): Payer: Self-pay

## 2024-03-27 ENCOUNTER — Other Ambulatory Visit: Payer: Self-pay

## 2024-03-27 ENCOUNTER — Other Ambulatory Visit (HOSPITAL_COMMUNITY): Payer: Self-pay

## 2024-03-27 MED ORDER — FREESTYLE LANCETS MISC
12 refills | Status: AC
  Filled 2024-03-27: qty 100, 50d supply, fill #0

## 2024-03-27 MED ORDER — FREESTYLE LITE TEST VI STRP
ORAL_STRIP | 12 refills | Status: AC
  Filled 2024-03-27: qty 100, 50d supply, fill #0

## 2024-03-31 ENCOUNTER — Other Ambulatory Visit: Payer: Self-pay

## 2024-04-04 ENCOUNTER — Other Ambulatory Visit (HOSPITAL_COMMUNITY): Payer: Self-pay

## 2024-04-10 ENCOUNTER — Other Ambulatory Visit: Payer: Self-pay | Admitting: Nurse Practitioner

## 2024-04-10 DIAGNOSIS — E782 Mixed hyperlipidemia: Secondary | ICD-10-CM

## 2024-04-11 ENCOUNTER — Other Ambulatory Visit (HOSPITAL_COMMUNITY): Payer: Self-pay

## 2024-04-11 MED ORDER — ROSUVASTATIN CALCIUM 20 MG PO TABS
20.0000 mg | ORAL_TABLET | Freq: Every day | ORAL | 1 refills | Status: AC
  Filled 2024-04-11: qty 90, 90d supply, fill #0

## 2024-04-21 ENCOUNTER — Encounter: Payer: Self-pay | Admitting: Neurology

## 2024-04-24 ENCOUNTER — Other Ambulatory Visit (HOSPITAL_COMMUNITY): Payer: Self-pay

## 2024-04-24 ENCOUNTER — Other Ambulatory Visit: Payer: Self-pay

## 2024-04-24 MED ORDER — ONABOTULINUMTOXINA 100 UNITS IJ SOLR
100.0000 [IU] | INTRAMUSCULAR | 2 refills | Status: AC
  Filled 2024-04-24: qty 1, 90d supply, fill #0

## 2024-04-24 NOTE — Telephone Encounter (Signed)
 Rx sent to [facility], [place].

## 2024-04-24 NOTE — Addendum Note (Signed)
 Addended by: DOUGLASS DELON CROME on: 04/24/2024 04:28 PM   Modules accepted: Orders

## 2024-04-24 NOTE — Telephone Encounter (Signed)
 Auth was approved for pt to transition to SP, please send rx to [facility] for Botox  100u. Pt is scheduled for 05/14/24.  Auth#: [id number] (04/21/24-04/20/25)

## 2024-04-25 ENCOUNTER — Other Ambulatory Visit: Payer: Self-pay

## 2024-05-02 ENCOUNTER — Other Ambulatory Visit: Payer: Self-pay | Admitting: Nurse Practitioner

## 2024-05-05 ENCOUNTER — Ambulatory Visit: Payer: Self-pay | Admitting: Nurse Practitioner

## 2024-05-06 ENCOUNTER — Other Ambulatory Visit: Payer: Self-pay

## 2024-05-06 MED ORDER — LORATADINE 10 MG PO TABS
10.0000 mg | ORAL_TABLET | Freq: Every day | ORAL | 1 refills | Status: AC
  Filled 2024-05-06: qty 60, 60d supply, fill #0

## 2024-05-07 ENCOUNTER — Other Ambulatory Visit: Payer: Self-pay

## 2024-05-07 ENCOUNTER — Other Ambulatory Visit (HOSPITAL_COMMUNITY): Payer: Self-pay

## 2024-05-08 ENCOUNTER — Ambulatory Visit: Admitting: Nurse Practitioner

## 2024-05-09 ENCOUNTER — Other Ambulatory Visit (HOSPITAL_COMMUNITY): Payer: Self-pay

## 2024-05-13 ENCOUNTER — Other Ambulatory Visit (HOSPITAL_COMMUNITY): Payer: Self-pay

## 2024-05-14 ENCOUNTER — Ambulatory Visit: Admitting: Neurology

## 2024-05-22 ENCOUNTER — Ambulatory Visit: Admitting: Nurse Practitioner

## 2024-12-31 ENCOUNTER — Encounter: Payer: Self-pay | Admitting: Nurse Practitioner
